# Patient Record
Sex: Male | Born: 1950 | Race: White | Hispanic: No | Smoking: Former smoker
Health system: Southern US, Community
[De-identification: ages and names within clinical notes are randomized; demographics above are authoritative.]

## PROBLEM LIST (undated history)

## (undated) DIAGNOSIS — J449 Chronic obstructive pulmonary disease, unspecified: Secondary | ICD-10-CM

## (undated) DIAGNOSIS — I219 Acute myocardial infarction, unspecified: Secondary | ICD-10-CM

## (undated) DIAGNOSIS — I5022 Chronic systolic (congestive) heart failure: Secondary | ICD-10-CM

## (undated) DIAGNOSIS — I10 Essential (primary) hypertension: Secondary | ICD-10-CM

## (undated) DIAGNOSIS — I2510 Atherosclerotic heart disease of native coronary artery without angina pectoris: Secondary | ICD-10-CM

## (undated) DIAGNOSIS — E119 Type 2 diabetes mellitus without complications: Secondary | ICD-10-CM

## (undated) DIAGNOSIS — E1129 Type 2 diabetes mellitus with other diabetic kidney complication: Secondary | ICD-10-CM

## (undated) HISTORY — PX: OTHER SURGICAL HISTORY: SHX169

## (undated) HISTORY — DX: Type 2 diabetes mellitus with other diabetic kidney complication: E11.29

---

## 1998-08-25 ENCOUNTER — Ambulatory Visit: Admission: RE | Admit: 1998-08-25 | Discharge: 1998-08-25 | Payer: Self-pay

## 2003-03-12 ENCOUNTER — Other Ambulatory Visit: Payer: Self-pay

## 2004-03-06 ENCOUNTER — Emergency Department: Payer: Self-pay | Admitting: Emergency Medicine

## 2004-03-28 ENCOUNTER — Other Ambulatory Visit: Payer: Self-pay

## 2004-03-28 ENCOUNTER — Inpatient Hospital Stay: Payer: Self-pay | Admitting: Internal Medicine

## 2004-04-06 ENCOUNTER — Ambulatory Visit: Payer: Self-pay

## 2004-04-10 ENCOUNTER — Encounter: Payer: Self-pay | Admitting: Cardiology

## 2004-04-18 ENCOUNTER — Encounter: Payer: Self-pay | Admitting: Cardiology

## 2004-05-19 ENCOUNTER — Encounter: Payer: Self-pay | Admitting: Cardiology

## 2004-06-18 ENCOUNTER — Encounter: Payer: Self-pay | Admitting: Cardiology

## 2007-01-09 ENCOUNTER — Other Ambulatory Visit: Payer: Self-pay

## 2007-01-09 ENCOUNTER — Emergency Department: Payer: Self-pay | Admitting: Unknown Physician Specialty

## 2007-01-10 ENCOUNTER — Emergency Department: Payer: Self-pay | Admitting: Emergency Medicine

## 2007-01-12 ENCOUNTER — Other Ambulatory Visit: Payer: Self-pay

## 2007-01-12 ENCOUNTER — Emergency Department: Payer: Self-pay | Admitting: Emergency Medicine

## 2007-07-10 ENCOUNTER — Emergency Department: Payer: Self-pay | Admitting: Emergency Medicine

## 2007-07-12 ENCOUNTER — Emergency Department: Payer: Self-pay | Admitting: Emergency Medicine

## 2007-12-18 ENCOUNTER — Inpatient Hospital Stay: Payer: Self-pay | Admitting: Internal Medicine

## 2008-08-02 ENCOUNTER — Inpatient Hospital Stay: Payer: Self-pay | Admitting: Internal Medicine

## 2008-10-19 ENCOUNTER — Ambulatory Visit: Payer: Self-pay | Admitting: Gastroenterology

## 2009-03-02 ENCOUNTER — Ambulatory Visit: Payer: Self-pay | Admitting: Cardiology

## 2009-03-03 ENCOUNTER — Ambulatory Visit: Payer: Self-pay | Admitting: Cardiology

## 2009-09-30 ENCOUNTER — Inpatient Hospital Stay: Payer: Self-pay | Admitting: Internal Medicine

## 2010-01-04 ENCOUNTER — Ambulatory Visit: Payer: Self-pay | Admitting: Family Medicine

## 2011-10-22 ENCOUNTER — Observation Stay: Payer: Self-pay | Admitting: Student

## 2011-10-22 LAB — TROPONIN I
Troponin-I: <0.02 ng/mL
Troponin-I: <0.02 ng/mL
Troponin-I: <0.02 ng/mL

## 2011-10-22 LAB — BASIC METABOLIC PANEL
Anion Gap: 3 — ABNORMAL LOW (ref 7–16)
Calcium, Total: 9.4 mg/dL (ref 8.5–10.1)
Co2: 34 mmol/L — ABNORMAL HIGH (ref 21–32)
Creatinine: 1.25 mg/dL (ref 0.60–1.30)
EGFR (African American): >60
EGFR (Non-African Amer.): >60
Glucose: 205 mg/dL — ABNORMAL HIGH (ref 65–99)

## 2011-10-22 LAB — CBC
HCT: 38.9 % — ABNORMAL LOW (ref 40.0–52.0)
HGB: 13.4 g/dL (ref 13.0–18.0)
MCHC: 34.4 g/dL (ref 32.0–36.0)
RBC: 4.25 10*6/uL — ABNORMAL LOW (ref 4.40–5.90)
RDW: 13.7 % (ref 11.5–14.5)
WBC: 6.1 10*3/uL (ref 3.8–10.6)

## 2011-10-22 LAB — CK-MB
CK-MB: 3.8 ng/mL — ABNORMAL HIGH (ref 0.5–3.6)
CK-MB: 3.5 ng/mL (ref 0.5–3.6)

## 2011-10-22 LAB — CK TOTAL AND CKMB (NOT AT ARMC)
CK, Total: 293 U/L — ABNORMAL HIGH (ref 35–232)
CK-MB: 3.8 ng/mL — ABNORMAL HIGH (ref 0.5–3.6)

## 2011-10-23 LAB — BASIC METABOLIC PANEL
Anion Gap: 7 (ref 7–16)
BUN: 20 mg/dL — ABNORMAL HIGH (ref 7–18)
Chloride: 101 mmol/L (ref 98–107)
Creatinine: 1.24 mg/dL (ref 0.60–1.30)
EGFR (African American): >60
EGFR (Non-African Amer.): >60
Glucose: 154 mg/dL — ABNORMAL HIGH (ref 65–99)
Osmolality: 283 (ref 275–301)
Potassium: 3.8 mmol/L (ref 3.5–5.1)
Sodium: 139 mmol/L (ref 136–145)

## 2011-10-23 LAB — CBC WITH DIFFERENTIAL/PLATELET
Basophil %: 0.5 %
Eosinophil #: 0.3 10*3/uL (ref 0.0–0.7)
HGB: 13.2 g/dL (ref 13.0–18.0)
Lymphocyte %: 26 %
MCH: 31 pg (ref 26.0–34.0)
MCHC: 34.6 g/dL (ref 32.0–36.0)
MCV: 90 fL (ref 80–100)
Monocyte #: 0.5 x10 3/mm (ref 0.2–1.0)
Monocyte %: 7.5 %
Neutrophil %: 61.7 %
RDW: 13.4 % (ref 11.5–14.5)

## 2011-10-23 LAB — LIPID PANEL: Cholesterol: 159 mg/dL (ref 0–200)

## 2011-10-23 LAB — HEMOGLOBIN A1C: Hemoglobin A1C: 9 % — ABNORMAL HIGH (ref 4.2–6.3)

## 2012-12-12 ENCOUNTER — Inpatient Hospital Stay: Payer: Self-pay | Admitting: Specialist

## 2012-12-12 LAB — COMPREHENSIVE METABOLIC PANEL
Albumin: 4.1 g/dL (ref 3.4–5.0)
Alkaline Phosphatase: 65 U/L (ref 50–136)
Anion Gap: 8 (ref 7–16)
BUN: 29 mg/dL — ABNORMAL HIGH (ref 7–18)
Bilirubin,Total: 0.2 mg/dL (ref 0.2–1.0)
Calcium, Total: 9.1 mg/dL (ref 8.5–10.1)
Chloride: 108 mmol/L — ABNORMAL HIGH (ref 98–107)
Co2: 24 mmol/L (ref 21–32)
Creatinine: 1.59 mg/dL — ABNORMAL HIGH (ref 0.60–1.30)
Glucose: 64 mg/dL — ABNORMAL LOW (ref 65–99)
Potassium: 3.8 mmol/L (ref 3.5–5.1)
Total Protein: 7.3 g/dL (ref 6.4–8.2)

## 2012-12-12 LAB — CBC
HCT: 39.5 % — ABNORMAL LOW (ref 40.0–52.0)
HGB: 13.6 g/dL (ref 13.0–18.0)
MCH: 31.3 pg (ref 26.0–34.0)
MCHC: 34.4 g/dL (ref 32.0–36.0)
MCV: 91 fL (ref 80–100)
Platelet: 211 10*3/uL (ref 150–440)
RBC: 4.35 10*6/uL — ABNORMAL LOW (ref 4.40–5.90)
RDW: 13.4 % (ref 11.5–14.5)

## 2012-12-12 LAB — CK TOTAL AND CKMB (NOT AT ARMC)
CK, Total: 607 U/L — ABNORMAL HIGH (ref 35–232)
CK-MB: 14.7 ng/mL — ABNORMAL HIGH (ref 0.5–3.6)

## 2012-12-12 LAB — TROPONIN I: Troponin-I: <0.02 ng/mL

## 2012-12-13 LAB — LIPID PANEL
Cholesterol: 160 mg/dL (ref 0–200)
HDL Cholesterol: 33 mg/dL — ABNORMAL LOW (ref 40–60)
Ldl Cholesterol, Calc: 61 mg/dL (ref 0–100)
Triglycerides: 328 mg/dL — ABNORMAL HIGH (ref 0–200)
VLDL Cholesterol, Calc: 66 mg/dL — ABNORMAL HIGH (ref 5–40)

## 2012-12-13 LAB — BASIC METABOLIC PANEL
Anion Gap: 3 — ABNORMAL LOW (ref 7–16)
BUN: 29 mg/dL — ABNORMAL HIGH (ref 7–18)
Calcium, Total: 8.4 mg/dL — ABNORMAL LOW (ref 8.5–10.1)
EGFR (African American): 53 — ABNORMAL LOW
Glucose: 93 mg/dL (ref 65–99)
Potassium: 3.7 mmol/L (ref 3.5–5.1)
Sodium: 141 mmol/L (ref 136–145)

## 2012-12-13 LAB — CK TOTAL AND CKMB (NOT AT ARMC)
CK, Total: 510 U/L — ABNORMAL HIGH (ref 35–232)
CK, Total: 486 U/L — ABNORMAL HIGH (ref 35–232)
CK-MB: 11.2 ng/mL — ABNORMAL HIGH (ref 0.5–3.6)
CK-MB: 9.9 ng/mL — ABNORMAL HIGH (ref 0.5–3.6)

## 2012-12-13 LAB — TROPONIN I
Troponin-I: <0.02 ng/mL
Troponin-I: <0.02 ng/mL

## 2012-12-13 LAB — APTT: Activated PTT: 51.7 secs — ABNORMAL HIGH (ref 23.6–35.9)

## 2012-12-23 ENCOUNTER — Ambulatory Visit: Payer: Self-pay | Admitting: Internal Medicine

## 2014-06-07 NOTE — Discharge Summary (Signed)
PATIENT NAME:  Lance Nunez, Lance Nunez MR#:  [id number] DATE OF BIRTH:  01/17/1951  DATE OF ADMISSION:  10/22/2011 DATE OF DISCHARGE:  10/23/2011  PRIMARY CARE PHYSICIAN: Dr. Iona Beard   CARDIOLOGIST: Dr. Nehemiah Massed    CONSULTANT: Dr. Nehemiah Massed   CHIEF COMPLAINT: Chest pain.   DISCHARGE DIAGNOSES:  1. Chest pain, possibly cardiac. The patient refused stress test.  2. Possible chronic systolic congestive heart failure with ejection fraction of 35 to 45% with regional wall motion abnormalities likely from coronary artery disease.  3. Uncontrolled diabetes.  4. Uncontrolled hyperlipidemia.  5. Gastroesophageal reflux disease.   6. History of V. tach, status post defibrillator placement.  7. History of previous myocardial infarction and coronary artery disease status post stenting and angioplasty. 8. CKD, stage III.  9. Chronic obstructive pulmonary disease.  10. Obstructive sleep apnea.   DISCHARGE MEDICATIONS:  1. Aspirin 81 mg 4 tabs once a day. 2. Glipizide 10 mg 1 tab 2 times a day. 3. Nitrostat 0.04% sublingual every five minutes x3 as needed for chest pain.  4. Gemfibrozil 600 mg once a day. 5. Coreg 3.125 mg 2 times a day.  6. Lovastatin 80 mg once a day.  7. Ranitidine 150 mg 2 tabs once a day. 8. Quinapril 10 mg once a day.  9. Omeprazole 20 mg once a day. 10. Gabapentin 600 mg 2 times a day.  11. Albuterol 90 mcg inhaled 2 puffs every 4 to 6 hours as needed for shortness of breath or wheezing.  12. Zetia 10 mg once a day. 13. Glucophage 500 mg once a day.  14. Advair 250/50 mcg inhaled 1 puff inhaled times a day.   DIET: Low sodium, consistent carb diabetic diet.   ACTIVITY: As tolerated.   FOLLOW-UP:  1. Please follow-up with Dr. Nehemiah Massed within 1 to 2 days for follow-up of chest pain.  2. Follow-up with your primary care physician for your blood sugar and cholesterol follow-up within 1 to 2 weeks.   DISPOSITION: Home.   CODE STATUS: FULL CODE.   HISTORY OF PRESENT  ILLNESS AND HOSPITAL COURSE: For full details of history and physical, please see the dictation on 10/22/2011 by Dr. Bridgette Habermann. Briefly, this is a 64 year old male with COPD, obstructive sleep apnea noncompliant with CPAP, and history of coronary artery disease status post MI and stenting in the past who presented with chest pain and was admitted to the hospitalist service for further evaluation and management. The patient had significant risk factors for acute coronary syndrome and, therefore, was admitted under observation to rule out acute coronary syndrome. The patient had a negative troponin on arrival. The patient was actually ruled out for acute coronary syndrome with cyclic cardiac markers and had no further chest pain. He was seen by Dr. Saralyn Pilar and Dr. Nehemiah Massed. Dr. Nehemiah Massed is his outpatient cardiologist. He underwent an echocardiogram showing EF of 35 to 40% with multiple wall motion abnormalities suggestive of ongoing coronary artery disease. Stress test was ordered for today, however, the patient refused secondary to severe claustrophobia. The patient was seen by Dr. Nehemiah Massed this morning and as he has no further chest pains and has been ruled out for acute coronary syndrome the recommendation was to go home with outpatient follow-up within the next couple of days. The patient is okay with this and will be discharged. However, the patient has significant risk factors including hemoglobin A1c of 9 and triglycerides in excess of 500. He needs further risk modifications and it was all explained to  him and the patient understands that he has got to lose weight, control his blood sugars better, and control his triglycerides better. At this point we would start him on Zetia as he is on gemfibrozil and statin already. We would also start him on metformin. Although he has CKD, his creatinine is stable at 1.24 with GFR of greater than 60 at this point. He will be discharged with those new medications and is to  follow-up with Dr. Nehemiah Massed. An appointment will be made for him within the next week or so. He is already on a beta-blocker and an ACE inhibitor.   TOTAL TIME SPENT: 40 minutes.   CODE STATUS: The patient is FULL CODE.    ____________________________ Vivien Presto, MD sa:drc D: 10/23/2011 11:37:50 ET T: 10/25/2011 10:14:43 ET JOB#: [id number]  cc: Vivien Presto, MD, <Dictator> Salome Holmes, MD Corey Skains, MD Vivien Presto MD ELECTRONICALLY SIGNED 10/30/2011 0:23

## 2014-06-07 NOTE — Consult Note (Signed)
PATIENT NAME:  Lance Nunez, Lance Nunez MR#:  [id number] DATE OF BIRTH:  1950-02-21  DATE OF CONSULTATION:  10/22/2011  REFERRING PHYSICIAN:   CONSULTING PHYSICIAN:  Isaias Cowman, MD  PRIMARY CARE PHYSICIAN: Dr. Iona Beard of [facility]  PRIMARY CARDIOLOGIST: Dr. Nehemiah Massed  CHIEF COMPLAINT: Chest pain.   REASON FOR CONSULTATION: Consultation requested for evaluation of chest pain.   HISTORY OF PRESENT ILLNESS: Patient is a 64 year old gentleman with known history of coronary artery disease and prior myocardial infarction. He reports that he was in his usual state of health until early this morning when he experienced substernal chest discomfort which was nonradiating. He presented to [facility] Emergency Room where EKG was nondiagnostic. Patient was treated with sublingual nitro spray which helped the chest pain. Patient was admitted to telemetry. Initial troponin is negative. Patient currently is chest pain free.   PAST MEDICAL HISTORY:  1. Status post myocardial infarction in 1995 with percutaneous transluminal coronary angioplasty and stent in mid LAD.  2. Status post myocardial infarction 11/1995 status post stent PDA.  3. Status post stent distal RCA 04/1997.   4. Status post percutaneous transluminal coronary angioplasty of in-stent restenosis of LAD 06/2003.   5. Status post Cypher stent of in-stent restenosis 09/2002.  6. Hypertension.  7. Hyperlipidemia. 8. Ventricular tachycardia status post implantable cardiac defibrillator 02/2004.   MEDICATIONS ON ADMISSION:  1. Aspirin 325 mg daily.  2. Accupril 10 mg daily.  3. Carvedilol 6.25 mg b.i.d.  4. Gemfibrozil 600 mg b.i.d.  5. Hydrochlorothiazide 25 mg daily.  6. Simvastatin 40 mg daily.  7. Gabapentin 600 mg daily.  8. Glucotrol 10 mg t.i.d.   SOCIAL HISTORY: The patient has a 21 pack-year tobacco abuse history.   FAMILY HISTORY: No immediate family history of coronary disease or myocardial infarction.   REVIEW OF  SYSTEMS: CONSTITUTIONAL: No fever or chills. EYES: No blurry vision. EARS: No hearing loss. RESPIRATORY: Shortness of breath. CARDIOVASCULAR: Chest pain as described above. GASTROINTESTINAL: No nausea, vomiting, diarrhea, or constipation. GENITOURINARY: No dysuria, hematuria. INTEGUMENTARY: No rash. HEMATOLOGICAL: No easy bruising or bleeding. MUSCULOSKELETAL: No arthralgias or myalgias. NEUROLOGICAL: No focal muscle weakness or numbness. PSYCHOLOGICAL: No depression or anxiety.   PHYSICAL EXAMINATION:  VITAL SIGNS: Blood pressure 151/91, pulse 69, respirations 19, temperature 38.2, pulse oximetry 93%.   HEENT: Pupils equal, reactive to light and accommodation.   NECK: Supple without thyromegaly.   LUNGS: Decreased breath sounds.   CARDIOVASCULAR: Normal jugular venous pressure. Normal point of maximal impulse. Regular rate and rhythm. Normal S1, S2. No appreciable gallop, murmur, rub.   ABDOMEN: Soft and nontender. Pulses were intact bilaterally.   MUSCULOSKELETAL: Normal muscle tone.   NEUROLOGIC: Patient is alert and oriented x3. Motor and sensory both grossly intact.   IMPRESSION: 64 year old gentleman with known coronary artery disease as described above who presents with chest pain with nondiagnostic EKG and negative troponin.    RECOMMENDATIONS:  1. Agree with current therapy.  2. Would defer full dose anticoagulation.  3. ETT sestamibi study in a.m.  4. Review 2-D echocardiogram.  ____________________________ Isaias Cowman, MD ap:cms D: 10/22/2011 13:49:34 ET T: 10/22/2011 14:17:11 ET JOB#: [id number]  cc: Isaias Cowman, MD, <Dictator> Isaias Cowman MD ELECTRONICALLY SIGNED 10/23/2011 9:00

## 2014-06-07 NOTE — H&P (Signed)
PATIENT NAME:  Lance Nunez, Lance Nunez MR#:  [id number] DATE OF BIRTH:  09-26-1950  DATE OF ADMISSION:  10/22/2011  REFERRING PHYSICIAN: Dr. Corky Downs PRIMARY CARE PHYSICIAN: Dr. Burn[facility]  PRIMARY CARDIOLOGIST: Dr. Nehemiah Massed   CHIEF COMPLAINT: Chest pain.   HISTORY OF PRESENT ILLNESS: Patient is a pleasant 64 year old Caucasian male with chronic obstructive pulmonary disease, obstructive sleep apnea, noncompliant with CPAP, history of coronary artery disease status post four stents and myocardial infarction in the past, hyperlipidemia who presents with chest pain starting about 4:30 in the morning who woke up from sleep with chest pain which is nonradiating in the center of his chest. Patient took two nitroglycerin and an aspirin and as pain persisted although a little better called EMS where he got nitroglycerin spray again x2 and another aspirin. Currently patient's pain is 2/10. Pain does not radiate. There is no diaphoresis. There is no shortness of breath or dizziness. Hospitalist service was contacted for further evaluation and management.   PAST MEDICAL HISTORY:  1. Hypertension.  2. Hyperlipidemia.  3. Gastroesophageal reflux disease.  4. History of V. tach status post defibrillator placement.  5. History of previous myocardial infarction in 1995, 1997, 1998.  6. History of coronary artery disease status post percutaneous transluminal coronary angioplasty and stent placement x4. 7. Chronic kidney disease stage III.  8. Chronic obstructive pulmonary disease. 9. Obstructive sleep apnea.   ALLERGIES: No known drug allergies.   FAMILY HISTORY: Mom with V. tach and history of coronary artery disease and diabetes in the family.   SOCIAL HISTORY: Used to be a smoker, quit about seven years ago. No alcohol or drug use. Lives at home.   MEDICATIONS:  1. Albuterol p.r.n.  2. Advair 250/50 inhaled 1 puff 2 times a day. 3. Aspirin 81 mg daily.  4. Coreg 3.125 mg daily.   5. Gabapentin 600 mg 2 times a day.  6. Glipizide 10 mg in the morning and 1 in the evening.  7. Lovastatin 80 mg daily.  8. Nitrostat sublingual 0.04 q.5 minutes x3 for chest pain as needed. 9. Omeprazole 20 mg daily.  10. Quinapril 10 mg daily.  11. Ranitidine 300 mg at bedtime.    REVIEW OF SYSTEMS: CONSTITUTIONAL: No fever or weight changes. No fatigue or weakness. EYES: No blurry vision, double vision. ENT: No tinnitus or hearing loss. No snoring. History of obstructive sleep apnea. RESPIRATORY: No cough or wheezing. Positive for chronic obstructive pulmonary disease. No painful respiration. CARDIOVASCULAR: Chest pain as above. No orthopnea or edema. History of V. tach in the past. No palpitations or syncope. GASTROINTESTINAL: No nausea, vomiting, diarrhea, abdominal pain, rectal bleeding or melena. GENITOURINARY: Denies dysuria, hematuria, incontinence. ENDOCRINE: Denies polyuria or nocturia. No thyroid problems. HEME/LYMPH: No anemia or easy bruising. SKIN: No new rashes. NEUROLOGICAL: No numbness, weakness. PSYCH: No anxiety or insomnia.   PHYSICAL EXAMINATION:  VITAL SIGNS: Temperature on arrival 98.3, pulse 70, respiratory rate 18, blood pressure 129/84, oxygen saturation 80% on room air which went to 96% on room air.   GENERAL: Patient is an obese Caucasian male lying in bed in no obvious distress, talking in full sentences.   HEENT: Normocephalic, atraumatic. Pupils equal and reactive. Anicteric sclerae. Moist mucous membranes. Extraocular muscles intact.   NECK: Supple. No thyroid tenderness.   RESPIRATORY: Good effort. No wheezing or rhonchi. Good air entry.   CARDIOVASCULAR: S1, S2 regular rate and rhythm. No murmurs, rubs, or gallops. No lower extremity edema.   GASTROINTESTINAL: No tenderness. Positive  bowel sounds all quadrants. Soft. No organomegaly noted.   SKIN: No obvious rashes.   PSYCH: Awake, alert, oriented x3. Pleasant, cooperative.   NEUROLOGICAL: Cranial  nerves II through XII grossly intact. Strength is 5/5 in all extremities. Sensation intact to light touch.   LABORATORY, DIAGNOSTIC AND RADIOLOGICAL DATA: Glucose 205, BUN 24, creatinine 1.25, potassium 4.1, CK-MB 3.8, CK total 293, troponin negative x1, WBC 6.1, hemoglobin 13.4, hematocrit 38.9, platelets 218. EKG: Normal sinus rhythm with some PACs, nonspecific intraventricular conduction delay and some nonspecific T wave changes. No acute ST elevations.   ASSESSMENT AND PLAN: We have a pleasant 64 year old Caucasian male with coronary artery disease status post stenting, V. tach status post defibrillator placement, history of myocardial infarction, hypertension, hyperlipidemia, diabetes who presents with chest pain. Patient with multiple risk factors for coronary artery disease and at this point we would admit the patient for observation and rule out myocardial infarction. Patient had significant improvement in his chest pain. There is no elevation of troponin but CK-MB portion is slightly elevated. We would obtain a cardiology consult, order an echo, cycle the troponins. Start the patient on aspirin, beta blocker and his statin and admit the patient for observation and see how he does. If patient does rule out for acute coronary syndrome we would do the stress test tomorrow. We would continue his gemfibrozil for the hyperlipidemia, check his lipid profile. Also start him on a nitro patch and sublingual nitroglycerin as needed for chest pain. I would continue his beta blocker as well as ACE inhibitor for the hypertension. His blood pressure remains stable. Would check a hemoglobin A1c and start the patient on sliding scale insulin for now and hold his glipizide. We would also continue the inhalers for chronic obstructive pulmonary disease. He does not appear to be on chronic obstructive pulmonary disease exacerbation and that is stable. Would continue his Neurontin for his diabetic neuropathy as well and  start him on PPI as well as deep vein thrombosis prophylaxis.   TOTAL TIME SPENT: 50 minutes.   CODE STATUS: Patient is FULL CODE.    ____________________________ Vivien Presto, MD sa:cms D: 10/22/2011 10:03:31 ET T: 10/22/2011 10:22:36 ET JOB#: [id number]  cc: Vivien Presto, MD, <Dictator> Salome Holmes, MD Corey Skains, MD Vivien Presto MD ELECTRONICALLY SIGNED 10/22/2011 17:41

## 2014-06-10 NOTE — Consult Note (Signed)
PATIENT NAME:  Lance Nunez, Lance Nunez MR#:  [id number] DATE OF BIRTH:  August 23, 1950  DATE OF CONSULTATION:  12/13/2012  REFERRING PHYSICIAN:  Dr. Posey Pronto. CONSULTING PHYSICIAN:  Corey Skains, MD  REASON FOR CONSULTATION: Unstable angina with known coronary artery disease, diabetes, hypertension and hyperlipidemia.   CHIEF COMPLAINT: "I had chest pain."   HISTORY OF PRESENT ILLNESS: This is a 64 year old male with known coronary disease status post previous PCI and stent placements, myocardial infarction who has had acute substernal chest discomfort radiating into his back with diaphoresis, weakness and fatigue and shortness of breath after carrying in groceries that lasted for two hours and relieved not by the nitroglycerin and/or other medical regimen, but by time. The patient has had an abnormal EKG in the past showing normal sinus rhythm with right bundle branch block with no evidence of acute myocardial infarction. Troponin and CK-MB within normal limits. The patient has had no current evidence of worsening chest pain at this time. He placed on appropriate medications and his diabetes, hypertension and hyperlipidemia have been very well controlled. The remainder review of systems negative for vision change, ringing in the ears, hearing loss, cough, congestion, heartburn, nausea, vomiting, diarrhea, bloody stools, stomach pain, extremity pain, leg weakness, cramping of the buttocks, known blood clots, headaches, blackouts, dizzy spells, nosebleeds, congestion, trouble swallowing, frequent urination, urination at night, muscle weakness, numbness, anxiety, depression, skin lesions, or skin rashes.   PAST MEDICAL HISTORY: 1. Coronary artery disease, status post PCI and stent placements.  2. Hypertension.  3. Hyperlipidemia.  4. Diabetes.   FAMILY HISTORY: Father had early onset of cardiovascular disease.   SOCIAL HISTORY: Currently denies alcohol or tobacco use.   ALLERGIES: As listed.    MEDICATIONS: As listed.   PHYSICAL EXAMINATION: VITAL SIGNS: Blood pressure 136/68 bilaterally, heart rate 72 upright, reclining, and regular.  GENERAL: He is a well appearing male in no acute distress.  HEENT: No icterus, thyromegaly, ulcers, hemorrhage, or xanthelasma.  CARDIOVASCULAR: Regular rate and rhythm. Normal S1 and S2. Diffuse PMI. No apparent significant heart murmurs. Carotid upstroke normal without bruit. Jugular venous pressure is normal.  LUNGS: Lungs have few basilar crackles with normal respirations.  ABDOMEN: Soft, nontender without hepatosplenomegaly or masses. Abdominal aorta is normal size without bruit.  EXTREMITIES: 2+ radial, femoral, dorsal pedal pulses, with trace lower extremity edema. No cyanosis, clubbing or ulcers.  NEUROLOGIC: He is oriented to time, place, and person, with normal mood and affect.   ASSESSMENT: A 64 year old male with a substernal chest pain consistent with possible unstable angina and symptoms consistent with his previous myocardial infarction and angina with an abnormal EKG, diabetes, hypertension and hyperlipidemia.   RECOMMENDATIONS: 1. Continue serial ECG and enzymes to assess for possible myocardial infarction.  2. Heparin for further risk reduction as well as aspirin.  3. Continue medication management including beta blocker, ACE inhibitor and nitrates for further risk reduction in symptoms.  4. Further consideration of cardiac catheterization versus stress test depending on ambulation as the patient is getting up and moving around this morning. We will follow very closely for the possibility. The patient understands the risks and benefits of cardiac catheterization. This includes the possibility of death, stroke, heart attack, infection, bleeding or blood clot. He is at low risk for conscious sedation.  5. The patient has chronic kidney disease and  renal protection if necessary, and also use of ACE inhibitor for hypertension control.   6. Echocardiogram for LV systolic dysfunction, valvular heart disease for further  evaluation of extent of previous LV systolic dysfunction from previous myocardial infarction and further treatment options as necessary.  ____________________________ Corey Skains, MD bjk:sg D: 12/13/2012 08:01:43 ET T: 12/13/2012 12:18:24 ET JOB#: [id number]  cc: Corey Skains, MD, <Dictator> Corey Skains MD ELECTRONICALLY SIGNED 12/14/2012 8:32

## 2014-06-10 NOTE — H&P (Signed)
PATIENT NAME:  Lance Nunez, WORD MR#:  [id number] DATE OF BIRTH:  Jan 01, 1951  DATE OF ADMISSION:  12/12/2012  PRIMARY CARE PHYSICIAN:  Dr. Iona Beard  PRIMARY PHYSICIAN:  Dr. Jimmye Norman   CHIEF COMPLAINT:  Chest pain today.   HISTORY OF PRESENT ILLNESS: A 64 year old Caucasian male with a history of hypertension, hyperlipidemia, CAD, who presented to the ED with chest pain today. The patient started to have chest pain about 6:00 p.m. today, which has been substernal, sharp, 6 out of 10, no radiation. In addition, the patient has diaphoresis and dizziness. He took nitroglycerin twice without relief. EMS gave aspirin. And sent patient to ED. The patient was noted to have elevated CK, CK-MB and started on heparin drip. The patient's chest pain has improved. The patient denies any other symptoms.   PAST MEDICAL HISTORY: Hypertension, hyperlipidemia, GERD, history of V-tach, status post defibrillator placement, previous MI 3 times, with multiple stents, CKD stage III, COPD,   obstructive sleep apnea, diabetes.   SOCIAL HISTORY: No smoking or drinking or illicit drugs.   FAMILY HISTORY: Mother had V-tach, history of CAD and diabetes.   SURGICAL HISTORY: Right lower extremity surgery.   ALLERGIES:  AXID and CODEINE.  HOME MEDICATIONS:  1.  Aspirin 81 mg p.o. daily.  2.  Fish oil 1000 mg p.o. daily. 3.  Gabapentin 600 mg t.i.d.  4.  Gemfibrozil 600 mg p.o. t.i.d.  5.  Glipizide XL 10 mg p.o. b.i.d.  6.  Lovastatin 40 mg p.o. 2 tablets at bedtime. 7.  Metformin 500 mg p.o. once a day before breakfast. 8.  Quinapril 10 mg p.o. daily.  9.  Ranitidine 300 mg p.o. at bedtime. 10.  Spironolactone 25 mg p.o. 0.5 tablets once a day.  11.  Sucralfate 1 gram p.o. t.i.d.  12.  Topiramate 100 mg p.o. daily.  13. Verapamil 180 mg p.o. daily.   REVIEW OF SYSTEMS:    CONSTITUTIONAL: The patient denies any fever or chills. No headache, but has dizziness and weakness.  EYES: No double vision or blurred  vision.  EARS, NOSE, THROAT: No postnasal drip, slurred speech or dysphagia.  CARDIOVASCULAR: Positive for chest pain and diaphoresis, but no palpitations, orthopnea, or nocturnal dyspnea. No leg edema.  PULMONARY: No cough, sputum, shortness of breath or hemoptysis.  GASTROINTESTINAL: No abdominal pain, nausea, vomiting or diarrhea.  GENITOURINARY: No dysuria, hematuria or incontinence.  SKIN: No rash or jaundice.  NEUROLOGY: No syncope, loss of consciousness or seizure.  HEMATOLOGY: No easy bruising or bleeding.  ENDOCRINOLOGY: No polyuria, polydipsia, heat or cold intolerance.   VITAL SIGNS: Temperature 98, blood pressure 108/64, pulse 61, O2 saturation 98% on oxygen by nasal cannula.   PHYSICAL EXAMINATION: GENERAL: The patient is alert, awake, oriented, in no acute distress.  HEENT: Pupils round, equal, react to light and accommodation.  NECK: Supple. No JVD or carotid bruits. No lymphadenopathy. No thyromegaly.  CARDIOVASCULAR: S1, S2. Regular rate and rhythm. No murmurs or gallops.  PULMONARY: Bilateral air entry. No wheezing or rales. No use of accessory muscle to breathe.  ABDOMEN: Soft, obese. Bowel sounds present. No organomegaly. No distention or tenderness.  EXTREMITIES: No edema, clubbing or cyanosis. No calf tenderness. Strong bilateral pedal pulses.  SKIN: No rash or jaundice.  NEUROLOGY: Alert and oriented x 3. No focal deficit. Power 5/5. Sensation intact.   LABORATORY DATA:  Troponin less than 0.02. CK 607, CK-MB 14.7. INR 1.0. WBC 7.2, hemoglobin 13.6, platelets 211. Glucose 64, BUN 29, creatinine 1.59. Electrolytes normal.  EKG showed normal sinus rhythm at 81 BPM.   IMPRESSIONS: 1.  Chest pain, possible non-ST segment elevation myocardial infarction.  2.  Coronary artery disease.  3.  Acute renal failure, on chronic kidney disease.  4.  Hypertension.  5.  Chronic obstructive pulmonary disease.  6.  Obstructive sleep apnea.  7.  Hyperlipidemia.  8.  Diabetes.    PLAN OF TREATMENT: 1.  The patient will be admitted to telemetry floor. We will continue O2 by nasal cannula. 2.  Continue heparin drip, and will continue aspirin, statin, and get a Cardiology consult from Dr. Nehemiah Massed. 3.  We will continue Coreg, but hold ACE inhibitor due to acute renal failure.  4.  We will start on normal saline and follow up BMP.  5.  Nebulizer p.r.n.   I discussed the patient's condition and plan of treatment with the patient and the patient's daughter.   THE PATIENT IS FULL CODE.   TIME SPENT: About 56 minutes.     ____________________________ Demetrios Loll, MD qc:mr D: 12/12/2012 21:12:00 ET T: 12/12/2012 22:13:42 ET JOB#: [id number]  cc: Demetrios Loll, MD, <Dictator> Demetrios Loll MD ELECTRONICALLY SIGNED 12/16/2012 17:57

## 2014-06-10 NOTE — Discharge Summary (Signed)
PATIENT NAME:  Lance Nunez, Lance Nunez MR#:  [id number] DATE OF BIRTH:  [birth year]  DATE OF ADMISSION:  12/12/2012 DATE OF DISCHARGE:  12/13/2012  For details of note, please take a look at the history and physical done on admission by Dr. Bridgett Larsson.   DIAGNOSES AT DISCHARGE: As follows: 1.  Chest pain, likely musculoskeletal in nature.  2.  Hypertension.  3.  Diabetes.  4.  Hyperlipidemia.  5.  History of coronary artery disease, status post stent placement.   DIET: The patient was discharged on a low-sodium, low-fat, American Diabetic Association diet.   ACTIVITY: As tolerated.   FOLLOWUP: With Dr. Serafina Royals in the next 1 to 2 days.   DISCHARGE MEDICATIONS: Lovastatin 80 mg at bedtime, quinapril 10 mg daily, gabapentin 600 mg t.i.d., gemfibrozil 600 mg t.i.d., sucralfate 1 gram t.i.d., aspirin 81 mg daily, Topamax 100 mg daily, metformin 5 mg daily, glipizide XL 10 mg b.i.d., Aldactone 12.5 mg daily, verapamil 180 mg daily, ranitidine 300 mg at bedtime, fish oil 1000 mg daily.   [place] COURSE: Dr. Serafina Royals from cardiology.   PERTINENT STUDIES DONE DURING THE HOSPITAL COURSE: A chest x-ray done on admission showing no acute cardiopulmonary disease.   HOSPITAL COURSE: This is a 64 year old male with medical problems as mentioned above, presented to the hospital with chest pain.     1.  Chest pain. The patient's symptoms of chest pain had some typical features for angina; therefore, he was observed overnight on telemetry. He did have significant risk factors given his previous history of coronary artery disease and diabetes. The patient had 3 sets of cardiac markers done, which were negative. The patient's chest pain had resolved and he was clinically asymptomatic. A cardiology consult was obtained. The patient was seen by Dr. Nehemiah Massed. The patient will have close followup with cardiology as an outpatient and be considered for an outpatient stress test if his  symptoms persist. At this point, he will continue medical management with his aspirin, beta blocker and statin as mentioned.  2.  Diabetes. The patient's blood sugars remain stable while in the hospital. His metformin was held due to some mild acute renal failure, although he will resume that upon discharge along with his glipizide.  3.  Hypertension. The patient remained hemodynamically stable on his verapamil and his quinapril, which he will resume that upon discharge.  4.  Hyperlipidemia. The patient was maintained on his lovastatin. He will resume that.  5.  GERD. The patient was maintained on his ranitidine, and he will also resume that upon discharge.  6.  History of seizures. The patient is on Topamax and he would also resume that upon discharge. He had no acute seizure-type activity while in the hospital.   CODE STATUS: The patient is a full code.   TIME SPENT ON DISCHARGE: 35 minutes.  ____________________________ Belia Heman. Verdell Carmine, MD vjs:aw D: 12/14/2012 08:41:00 ET T: 12/14/2012 09:13:52 ET JOB#: [id number]  cc: Belia Heman. Verdell Carmine, MD, <Dictator> Corey Skains, MD Henreitta Leber MD ELECTRONICALLY SIGNED 12/16/2012 14:33

## 2014-07-20 ENCOUNTER — Emergency Department: Payer: Medicare Other

## 2014-07-20 ENCOUNTER — Emergency Department
Admission: EM | Admit: 2014-07-20 | Discharge: 2014-07-20 | Discharge status: Against Medical Advice | Payer: Medicare Other | Attending: Emergency Medicine | Admitting: Emergency Medicine

## 2014-07-20 DIAGNOSIS — R0600 Dyspnea, unspecified: Secondary | ICD-10-CM | POA: Diagnosis not present

## 2014-07-20 DIAGNOSIS — I10 Essential (primary) hypertension: Secondary | ICD-10-CM | POA: Insufficient documentation

## 2014-07-20 DIAGNOSIS — E119 Type 2 diabetes mellitus without complications: Secondary | ICD-10-CM | POA: Diagnosis not present

## 2014-07-20 HISTORY — DX: Essential (primary) hypertension: I10

## 2014-07-20 HISTORY — DX: Atherosclerotic heart disease of native coronary artery without angina pectoris: I25.10

## 2014-07-20 HISTORY — DX: Type 2 diabetes mellitus without complications: E11.9

## 2014-07-20 HISTORY — DX: Chronic obstructive pulmonary disease, unspecified: J44.9

## 2014-07-20 LAB — COMPREHENSIVE METABOLIC PANEL
ALK PHOS: 69 U/L (ref 38–126)
ALT: 12 U/L — AB (ref 17–63)
AST: 19 U/L (ref 15–41)
Albumin: 3.6 g/dL (ref 3.5–5.0)
Anion gap: 10 (ref 5–15)
BUN: 22 mg/dL — AB (ref 6–20)
CO2: 27 mmol/L (ref 22–32)
Calcium: 8.9 mg/dL (ref 8.9–10.3)
Chloride: 105 mmol/L (ref 101–111)
Creatinine, Ser: 1.37 mg/dL — ABNORMAL HIGH (ref 0.61–1.24)
GFR calc Af Amer: >60 mL/min (ref >60)
GFR calc non Af Amer: 53 mL/min — ABNORMAL LOW (ref >60)
Glucose, Bld: 111 mg/dL — ABNORMAL HIGH (ref 65–99)
Potassium: 3.5 mmol/L (ref 3.5–5.1)
Sodium: 142 mmol/L (ref 135–145)
Total Bilirubin: 0.3 mg/dL (ref 0.3–1.2)
Total Protein: 6.8 g/dL (ref 6.5–8.1)

## 2014-07-20 LAB — CBC
HEMATOCRIT: 40.5 % (ref 40.0–52.0)
Hemoglobin: 13.6 g/dL (ref 13.0–18.0)
MCH: 30.7 pg (ref 26.0–34.0)
MCHC: 33.7 g/dL (ref 32.0–36.0)
MCV: 91.1 fL (ref 80.0–100.0)
Platelets: 240 10*3/uL (ref 150–440)
RBC: 4.44 MIL/uL (ref 4.40–5.90)
RDW: 14.1 % (ref 11.5–14.5)
WBC: 8.9 10*3/uL (ref 3.8–10.6)

## 2014-07-20 LAB — TROPONIN I: Troponin I: <0.03 ng/mL (ref <0.031)

## 2014-07-20 NOTE — ED Notes (Signed)
Patient presents for difficulty breathing that woke him up about 30 mins PTA. Patient denies CP though states he is "suffocating." Patient able to answer questions in complete sentences and normal sitting posture. Denies N/V

## 2015-06-09 ENCOUNTER — Emergency Department
Admission: EM | Admit: 2015-06-09 | Discharge: 2015-06-09 | Disposition: A | Discharge status: Home / Self Care | Payer: Medicare Other | Attending: Emergency Medicine | Admitting: Emergency Medicine

## 2015-06-09 ENCOUNTER — Emergency Department: Payer: Medicare Other

## 2015-06-09 DIAGNOSIS — E119 Type 2 diabetes mellitus without complications: Secondary | ICD-10-CM | POA: Diagnosis not present

## 2015-06-09 DIAGNOSIS — G4489 Other headache syndrome: Secondary | ICD-10-CM | POA: Diagnosis not present

## 2015-06-09 DIAGNOSIS — I10 Essential (primary) hypertension: Secondary | ICD-10-CM | POA: Diagnosis not present

## 2015-06-09 DIAGNOSIS — Z87891 Personal history of nicotine dependence: Secondary | ICD-10-CM | POA: Insufficient documentation

## 2015-06-09 DIAGNOSIS — R51 Headache: Secondary | ICD-10-CM | POA: Diagnosis present

## 2015-06-09 DIAGNOSIS — J449 Chronic obstructive pulmonary disease, unspecified: Secondary | ICD-10-CM | POA: Insufficient documentation

## 2015-06-09 DIAGNOSIS — I2510 Atherosclerotic heart disease of native coronary artery without angina pectoris: Secondary | ICD-10-CM | POA: Diagnosis not present

## 2015-06-09 NOTE — Discharge Instructions (Signed)

## 2015-06-09 NOTE — ED Notes (Signed)
Pt presents to ED with complaints of sharp pain left side of his head; pt states "it felt like an electric shock."  Pt reports similar episode last week where he got an "achy" feeling on left side of head while he was driving.  Pt reports the pain was intermittent with both episodes.  Pt denies any dizziness or changes in vision.  No signs of immediate distress at this time.

## 2015-06-09 NOTE — ED Notes (Addendum)
Pt reports that after eating he stood and had left side head pain that felt like an "electrical shock"  Pain occurred and then immediately stopped. Has a similar episode last week.  Pt is alert / oriented.  No weakness or deficits noted.  Clear speech. No pain at this time.

## 2015-06-09 NOTE — ED Notes (Signed)
Reviewed d/c instructions, follow-up care, and reasons to return to further treatment with pt. Pt verbalized understanding.

## 2015-06-09 NOTE — ED Provider Notes (Signed)
[facility] Emergency Department Provider Note  ____________________________________________  Time seen: Approximately 7:40 PM  I have reviewed the triage vital signs and the nursing notes.   HISTORY  Chief Complaint Headache    HPI Lance Nunez is a 65 y.o. male who presents emergency department complaining of sharp left-sided head pain. Patient states that he has had 2 episodes of electric shocklike feeling to the left side of his head. Patient states that previous episode was one week prior. He was driving at the time and felt a sharp sensation followed by numbness that only lasted a few seconds. Patient denied any visual changes, neck pain, trauma, loss of consciousness at that time. Patient reports that he had no further symptoms until this evening. He states that he was getting up from his chair when he felt a "lightning bolt/electrical shock" sensation to the left side of the head. Patient denies losing consciousness. He denies any visual changes, neck pain, chest pain, shortness of breath, nausea or vomiting. Patient denies any history of previous CVA/TIA. Patient is symptom-free at this time. Patient has not taken any medications prior to arrival.   Past Medical History  Diagnosis Date  . COPD (chronic obstructive pulmonary disease)   . Diabetes mellitus without complication   . Coronary artery disease   . Hypertension     There are no active problems to display for this patient.   Past Surgical History  Procedure Laterality Date  . Fractured leg Right   . Stents N/A     4 cardiac stents    No current outpatient prescriptions on file.  Allergies Codeine  No family history on file.  Social History Social History  Substance Use Topics  . Smoking status: Former Smoker    Quit date: 07/19/2004  . Smokeless tobacco: Not on file  . Alcohol Use: No   Review of Systems  Constitutional: No fever/chills Eyes: No visual changes.   Cardiovascular: no chest pain. Respiratory: no cough. No SOB. Gastrointestinal: No nausea, no vomiting.   Musculoskeletal: Negative for neck pain. Skin: Negative for rash. Neurological: Positive for headaches but denies focal weakness or numbness. 10-point ROS otherwise negative.   ____________________________________________   PHYSICAL EXAM:  VITAL SIGNS: ED Triage Vitals  Enc Vitals Group     BP 06/09/15 1824 170/94 mmHg     Pulse Rate 06/09/15 1822 92     Resp 06/09/15 1822 18     Temp 06/09/15 1822 98 F (36.7 C)     Temp Source 06/09/15 1822 Oral     SpO2 06/09/15 1822 96 %     Weight 06/09/15 1822 210 lb (95.255 kg)     Height 06/09/15 1822 5\' 5"  (1.651 m)     Head Cir --      Peak Flow --      Pain Score --      Pain Loc --      Pain Edu? --      Excl. in [place]? --    Constitutional: Alert and oriented. Well appearing and in no acute distress. Eyes: Conjunctivae are normal. PERRL. EOMI. Head: Atraumatic. Neck: No stridor.  No cervical spine tenderness to palpation. Neck with full range of motion. Cardiovascular: Normal rate, regular rhythm. Normal S1 and S2.  Good peripheral circulation. Respiratory: Normal respiratory effort without tachypnea or retractions. Lungs CTAB. Musculoskeletal: Full range of motion all extremities Neurologic:  Normal speech and language. No gross focal neurologic deficits are appreciated. He understood to 14  are grossly intact Skin:  Skin is warm, dry and intact. No rash noted. Psychiatric: Mood and affect are normal. Speech and behavior are normal. Patient exhibits appropriate insight and judgement.     ____________________________________________   LABS (all labs ordered are listed, but only abnormal results are displayed)  Labs Reviewed - No data to display ____________________________________________  EKG   ____________________________________________  RADIOLOGY Diamantina [facility] Reyansh Kushnir, personally viewed and evaluated  these images (plain radiographs) as part of my medical decision making, as well as reviewing the written report by the radiologist.  No results found.  ____________________________________________    PROCEDURES  Procedure(s) performed:       Medications - No data to display   ____________________________________________   INITIAL IMPRESSION / ASSESSMENT AND PLAN / ED COURSE  Pertinent labs & imaging results that were available during my care of the patient were reviewed by me and considered in my medical decision making (see chart for details).  Patient's diagnosis is consistent with headache of unknown origin. Patient reports to sign onset headaches that lasted only a few seconds a piece. He describes them as electric shock sensation. Patient is currently symptom free. Exam is very reassuring. CT reveals no acute intracranial abnormality.. No medications were prescribed at this time. Patient is to follow-up with neurologist for further evaluation. Patient is given ED precautions to return to the ED for any worsening or new symptoms.     ____________________________________________  FINAL CLINICAL IMPRESSION(S) / ED DIAGNOSES  Final diagnoses:  Other headache syndrome      NEW MEDICATIONS STARTED DURING THIS VISIT:  There are no discharge medications for this patient.       This chart was dictated using voice recognition software/Dragon. Despite best efforts to proofread, errors can occur which can change the meaning. Any change was purely unintentional.    Darletta Moll, PA-C 06/14/15 1819  Daymon Larsen, MD 06/15/15 1455

## 2015-08-28 ENCOUNTER — Inpatient Hospital Stay: Admission: RE | Admit: 2015-08-28 | Payer: Medicare Other | Source: Ambulatory Visit

## 2015-08-29 ENCOUNTER — Ambulatory Visit
Admission: RE | Admit: 2015-08-29 | Discharge: 2015-08-29 | Disposition: A | Discharge status: Home / Self Care | Payer: Medicare HMO | Source: Ambulatory Visit | Attending: Cardiology | Admitting: Cardiology

## 2015-08-29 ENCOUNTER — Encounter
Admission: RE | Admit: 2015-08-29 | Discharge: 2015-08-29 | Disposition: A | Discharge status: Home / Self Care | Payer: Medicare HMO | Source: Ambulatory Visit | Attending: Cardiology | Admitting: Cardiology

## 2015-08-29 DIAGNOSIS — F17200 Nicotine dependence, unspecified, uncomplicated: Secondary | ICD-10-CM

## 2015-08-29 DIAGNOSIS — Z01810 Encounter for preprocedural cardiovascular examination: Secondary | ICD-10-CM | POA: Diagnosis not present

## 2015-08-29 DIAGNOSIS — I252 Old myocardial infarction: Secondary | ICD-10-CM | POA: Insufficient documentation

## 2015-08-29 DIAGNOSIS — I472 Ventricular tachycardia: Secondary | ICD-10-CM | POA: Diagnosis not present

## 2015-08-29 DIAGNOSIS — Z95810 Presence of automatic (implantable) cardiac defibrillator: Secondary | ICD-10-CM | POA: Insufficient documentation

## 2015-08-29 DIAGNOSIS — I10 Essential (primary) hypertension: Secondary | ICD-10-CM | POA: Insufficient documentation

## 2015-08-29 DIAGNOSIS — Z7982 Long term (current) use of aspirin: Secondary | ICD-10-CM | POA: Insufficient documentation

## 2015-08-29 DIAGNOSIS — I2510 Atherosclerotic heart disease of native coronary artery without angina pectoris: Secondary | ICD-10-CM | POA: Insufficient documentation

## 2015-08-29 DIAGNOSIS — I509 Heart failure, unspecified: Secondary | ICD-10-CM | POA: Diagnosis not present

## 2015-08-29 DIAGNOSIS — Z01812 Encounter for preprocedural laboratory examination: Secondary | ICD-10-CM | POA: Diagnosis present

## 2015-08-29 DIAGNOSIS — I5022 Chronic systolic (congestive) heart failure: Secondary | ICD-10-CM | POA: Insufficient documentation

## 2015-08-29 DIAGNOSIS — Z45010 Encounter for checking and testing of cardiac pacemaker pulse generator [battery]: Secondary | ICD-10-CM

## 2015-08-29 DIAGNOSIS — Z79899 Other long term (current) drug therapy: Secondary | ICD-10-CM | POA: Insufficient documentation

## 2015-08-29 DIAGNOSIS — E785 Hyperlipidemia, unspecified: Secondary | ICD-10-CM | POA: Insufficient documentation

## 2015-08-29 DIAGNOSIS — Z7984 Long term (current) use of oral hypoglycemic drugs: Secondary | ICD-10-CM | POA: Insufficient documentation

## 2015-08-29 HISTORY — DX: Acute myocardial infarction, unspecified: I21.9

## 2015-08-29 LAB — DIFFERENTIAL
Basophils Absolute: 0 10*3/uL (ref 0–0.1)
Basophils Relative: 1 %
Eosinophils Absolute: 0.2 10*3/uL (ref 0–0.7)
Eosinophils Relative: 2 %
LYMPHS PCT: 23 %
Lymphs Abs: 1.9 10*3/uL (ref 1.0–3.6)
MONO ABS: 0.5 10*3/uL (ref 0.2–1.0)
MONOS PCT: 7 %
Neutro Abs: 5.5 10*3/uL (ref 1.4–6.5)
Neutrophils Relative %: 67 %

## 2015-08-29 LAB — CBC
HCT: 42.5 % (ref 40.0–52.0)
Hemoglobin: 14.6 g/dL (ref 13.0–18.0)
MCH: 31.1 pg (ref 26.0–34.0)
MCHC: 34.3 g/dL (ref 32.0–36.0)
MCV: 90.7 fL (ref 80.0–100.0)
PLATELETS: 180 10*3/uL (ref 150–440)
RBC: 4.69 MIL/uL (ref 4.40–5.90)
RDW: 14.4 % (ref 11.5–14.5)
WBC: 8.1 10*3/uL (ref 3.8–10.6)

## 2015-08-29 LAB — BASIC METABOLIC PANEL
Anion gap: 7 (ref 5–15)
BUN: 21 mg/dL — AB (ref 6–20)
CHLORIDE: 101 mmol/L (ref 101–111)
CO2: 30 mmol/L (ref 22–32)
Calcium: 8.9 mg/dL (ref 8.9–10.3)
Creatinine, Ser: 1.39 mg/dL — ABNORMAL HIGH (ref 0.61–1.24)
GFR calc Af Amer: 60 mL/min — ABNORMAL LOW (ref >60)
GFR calc non Af Amer: 52 mL/min — ABNORMAL LOW (ref >60)
GLUCOSE: 145 mg/dL — AB (ref 65–99)
Potassium: 4.7 mmol/L (ref 3.5–5.1)
SODIUM: 138 mmol/L (ref 135–145)

## 2015-08-29 LAB — SURGICAL PCR SCREEN
MRSA, PCR: NEGATIVE
STAPHYLOCOCCUS AUREUS: NEGATIVE

## 2015-08-29 NOTE — Patient Instructions (Addendum)
Your procedure is scheduled on: Wednesday 09/06/15 Report to Day Surgery. 2ND FLOOR MEDICAL MALL ENTRANCE To find out your arrival time please call [phone] between 1PM - 3PM on Tuesday 09/05/15.  Remember: Instructions that are not followed completely may result in serious medical risk, up to and including death, or upon the discretion of your surgeon and anesthesiologist your surgery may need to be rescheduled.    __X__ 1. Do not eat food or drink liquids after midnight. No gum chewing or hard candies.     __X__ 2. No Alcohol for 24 hours before or after surgery.   ____ 3. Bring all medications with you on the day of surgery if instructed.    __X__ 4. Notify your doctor if there is any change in your medical condition     (cold, fever, infections).     Do not wear jewelry, make-up, hairpins, clips or nail polish.  Do not wear lotions, powders, or perfumes.   Do not shave 48 hours prior to surgery. Men may shave face and neck.  Do not bring valuables to the hospital.    [facility] is not responsible for any belongings or valuables.               Contacts, dentures or bridgework may not be worn into surgery.  Leave your suitcase in the car. After surgery it may be brought to your room.  For patients admitted to the hospital, discharge time is determined by your                treatment team.   Patients discharged the day of surgery will not be allowed to drive home.   Please read over the following fact sheets that you were given:   MRSA Information and Surgical Site Infection Prevention   __X__ Take these medicines the morning of surgery with A SIP OF WATER:    1. CARVEDILOL  2. GABAPENTIN  3. GEMFIBROZIL  4. QUINAPRIL  5. ranitadine  6.  ____ Fleet Enema (as directed)   __X__ Use CHG Soap as directed  ____ Use inhalers on the day of surgery  __X__ Stop metformin 2 days prior to surgery    ____ Take 1/2 of usual insulin dose the night before surgery and none on the  morning of surgery.   __X__ Stop Coumadin/Plavix/aspirin on CONTACT CARDIOLOGIST ABOUT STOPPING ASPIRIN  ____ Stop Anti-inflammatories on    ____ Stop supplements until after surgery.    __X__ Bring BIPAP/ C-Pap to the hospital.

## 2015-09-06 ENCOUNTER — Encounter
Admission: RE | Disposition: A | Discharge status: Home / Self Care | Payer: Self-pay | Source: Ambulatory Visit | Attending: Cardiology

## 2015-09-06 ENCOUNTER — Encounter: Payer: Self-pay | Admitting: *Deleted

## 2015-09-06 ENCOUNTER — Ambulatory Visit: Payer: Medicare HMO | Admitting: Certified Registered Nurse Anesthetist

## 2015-09-06 ENCOUNTER — Ambulatory Visit
Admission: RE | Admit: 2015-09-06 | Discharge: 2015-09-06 | Disposition: A | Discharge status: Home / Self Care | Payer: Medicare HMO | Source: Ambulatory Visit | Attending: Cardiology | Admitting: Cardiology

## 2015-09-06 DIAGNOSIS — Z4502 Encounter for adjustment and management of automatic implantable cardiac defibrillator: Secondary | ICD-10-CM | POA: Insufficient documentation

## 2015-09-06 DIAGNOSIS — Z6835 Body mass index (BMI) 35.0-35.9, adult: Secondary | ICD-10-CM | POA: Diagnosis not present

## 2015-09-06 DIAGNOSIS — E785 Hyperlipidemia, unspecified: Secondary | ICD-10-CM | POA: Insufficient documentation

## 2015-09-06 DIAGNOSIS — Z885 Allergy status to narcotic agent status: Secondary | ICD-10-CM | POA: Insufficient documentation

## 2015-09-06 DIAGNOSIS — I110 Hypertensive heart disease with heart failure: Secondary | ICD-10-CM | POA: Insufficient documentation

## 2015-09-06 DIAGNOSIS — Z888 Allergy status to other drugs, medicaments and biological substances status: Secondary | ICD-10-CM | POA: Diagnosis not present

## 2015-09-06 DIAGNOSIS — Z87891 Personal history of nicotine dependence: Secondary | ICD-10-CM | POA: Insufficient documentation

## 2015-09-06 DIAGNOSIS — E669 Obesity, unspecified: Secondary | ICD-10-CM | POA: Insufficient documentation

## 2015-09-06 DIAGNOSIS — Z79899 Other long term (current) drug therapy: Secondary | ICD-10-CM | POA: Diagnosis not present

## 2015-09-06 DIAGNOSIS — I2510 Atherosclerotic heart disease of native coronary artery without angina pectoris: Secondary | ICD-10-CM | POA: Insufficient documentation

## 2015-09-06 DIAGNOSIS — Z955 Presence of coronary angioplasty implant and graft: Secondary | ICD-10-CM | POA: Insufficient documentation

## 2015-09-06 DIAGNOSIS — I472 Ventricular tachycardia: Secondary | ICD-10-CM | POA: Diagnosis present

## 2015-09-06 DIAGNOSIS — Z7984 Long term (current) use of oral hypoglycemic drugs: Secondary | ICD-10-CM | POA: Insufficient documentation

## 2015-09-06 DIAGNOSIS — E119 Type 2 diabetes mellitus without complications: Secondary | ICD-10-CM | POA: Diagnosis not present

## 2015-09-06 DIAGNOSIS — Z7982 Long term (current) use of aspirin: Secondary | ICD-10-CM | POA: Insufficient documentation

## 2015-09-06 DIAGNOSIS — I252 Old myocardial infarction: Secondary | ICD-10-CM | POA: Insufficient documentation

## 2015-09-06 DIAGNOSIS — I5022 Chronic systolic (congestive) heart failure: Secondary | ICD-10-CM | POA: Insufficient documentation

## 2015-09-06 DIAGNOSIS — Z6836 Body mass index (BMI) 36.0-36.9, adult: Secondary | ICD-10-CM | POA: Insufficient documentation

## 2015-09-06 HISTORY — PX: IMPLANTABLE CARDIOVERTER DEFIBRILLATOR (ICD) GENERATOR CHANGE: SHX5469

## 2015-09-06 LAB — GLUCOSE, CAPILLARY
GLUCOSE-CAPILLARY: 144 mg/dL — AB (ref 65–99)
Glucose-Capillary: 130 mg/dL — ABNORMAL HIGH (ref 65–99)

## 2015-09-06 SURGERY — ICD GENERATOR CHANGE
Anesthesia: Monitor Anesthesia Care | Site: Chest | Wound class: Clean

## 2015-09-06 MED ORDER — PHENYLEPHRINE HCL 10 MG/ML IJ SOLN
INTRAMUSCULAR | Status: DC | PRN
  Administered 2015-09-06 (×2): 100 ug via INTRAVENOUS

## 2015-09-06 MED ORDER — FENTANYL CITRATE (PF) 100 MCG/2ML IJ SOLN: 25.0000 ug | INTRAMUSCULAR | Status: DC | PRN

## 2015-09-06 MED ORDER — MIDAZOLAM HCL 2 MG/2ML IJ SOLN
INTRAMUSCULAR | Status: DC | PRN
  Administered 2015-09-06: 1 mg via INTRAVENOUS

## 2015-09-06 MED ORDER — ONDANSETRON HCL 4 MG/2ML IJ SOLN: 4.0000 mg | Freq: Once | INTRAMUSCULAR | Status: DC | PRN

## 2015-09-06 MED ORDER — CEFAZOLIN IN D5W 1 GM/50ML IV SOLN: 1.0000 g | Freq: Once | INTRAVENOUS | Status: DC

## 2015-09-06 MED ORDER — LIDOCAINE 1 % OPTIME INJ - NO CHARGE
INTRAMUSCULAR | Status: DC | PRN
  Administered 2015-09-06: 19 mL

## 2015-09-06 MED ORDER — PROPOFOL 10 MG/ML IV BOLUS
INTRAVENOUS | Status: DC | PRN
  Administered 2015-09-06: 40 mg via INTRAVENOUS

## 2015-09-06 MED ORDER — CEFAZOLIN IN D5W 1 GM/50ML IV SOLN
INTRAVENOUS | Status: AC
  Filled 2015-09-06: qty 50

## 2015-09-06 MED ORDER — ACETAMINOPHEN 325 MG PO TABS: 325.0000 mg | ORAL_TABLET | ORAL | Status: DC | PRN

## 2015-09-06 MED ORDER — SODIUM CHLORIDE 0.9 % IV SOLN
INTRAVENOUS | Status: DC
  Administered 2015-09-06: 13:00:00 via INTRAVENOUS

## 2015-09-06 MED ORDER — CEPHALEXIN 250 MG PO CAPS: 250.0000 mg | ORAL_CAPSULE | Freq: Four times a day (QID) | ORAL | Status: DC

## 2015-09-06 MED ORDER — EPHEDRINE SULFATE 50 MG/ML IJ SOLN
INTRAMUSCULAR | Status: DC | PRN
  Administered 2015-09-06: 10 mg via INTRAVENOUS

## 2015-09-06 MED ORDER — ONDANSETRON HCL 4 MG/2ML IJ SOLN: 4.0000 mg | Freq: Four times a day (QID) | INTRAMUSCULAR | Status: DC | PRN

## 2015-09-06 MED ORDER — PROPOFOL 500 MG/50ML IV EMUL
INTRAVENOUS | Status: DC | PRN
  Administered 2015-09-06: 140 ug/kg/min via INTRAVENOUS
  Administered 2015-09-06: 150 ug/kg/min via INTRAVENOUS

## 2015-09-06 MED ORDER — SODIUM CHLORIDE 0.9 % IR SOLN
Freq: Once | Status: DC
  Filled 2015-09-06: qty 2

## 2015-09-06 MED ORDER — FENTANYL CITRATE (PF) 100 MCG/2ML IJ SOLN
INTRAMUSCULAR | Status: DC | PRN
  Administered 2015-09-06: 50 ug via INTRAVENOUS

## 2015-09-06 MED ORDER — MINERAL OIL LIGHT 100 % EX OIL
TOPICAL_OIL | CUTANEOUS | Status: AC
  Filled 2015-09-06: qty 25

## 2015-09-06 SURGICAL SUPPLY — 49 items
BAG DECANTER FOR FLEXI CONT (MISCELLANEOUS) ×3 IMPLANT
BLADE SURG SZ10 CARB STEEL (BLADE) ×3 IMPLANT
CANISTER SUCT 1200ML W/VALVE (MISCELLANEOUS) ×3 IMPLANT
CHLORAPREP W/TINT 26ML (MISCELLANEOUS) ×3 IMPLANT
CLOSURE WOUND 1/2 X4 (GAUZE/BANDAGES/DRESSINGS) ×1
COVER LIGHT HANDLE STERIS (MISCELLANEOUS) ×6 IMPLANT
DEVICE DISSECT PLASMABLAD 3.0S (MISCELLANEOUS) ×1 IMPLANT
DRAPE C-ARM XRAY 36X54 (DRAPES) ×3 IMPLANT
DRAPE INCISE IOBAN 66X45 STRL (DRAPES) ×3 IMPLANT
DRAPE LAPAROTOMY 77X122 PED (DRAPES) ×3 IMPLANT
DRESSING TELFA 4X3 1S ST N-ADH (GAUZE/BANDAGES/DRESSINGS) ×2 IMPLANT
DRSG TEGADERM 4X4.75 (GAUZE/BANDAGES/DRESSINGS) ×3 IMPLANT
DRSG TEGADERM 6X8 (GAUZE/BANDAGES/DRESSINGS) ×3 IMPLANT
ELECT REM PT RETURN 9FT ADLT (ELECTROSURGICAL) ×3
ELECTRODE REM PT RTRN 9FT ADLT (ELECTROSURGICAL) ×1 IMPLANT
GLOVE BIO SURGEON STRL SZ8 (GLOVE) ×3 IMPLANT
GOWN STRL REUS W/ TWL LRG LVL3 (GOWN DISPOSABLE) ×1 IMPLANT
GOWN STRL REUS W/ TWL XL LVL3 (GOWN DISPOSABLE) ×1 IMPLANT
GOWN STRL REUS W/TWL LRG LVL3 (GOWN DISPOSABLE) ×3
GOWN STRL REUS W/TWL XL LVL3 (GOWN DISPOSABLE) ×3
GRADUATE 1200CC STRL 31836 (MISCELLANEOUS) ×3 IMPLANT
ICD EVERA XT MRI DF1  DDMB1D1 (ICD Generator) ×2 IMPLANT
ICD EVERA XT MRI DF1 DDMB1D1 (ICD Generator) IMPLANT
IV NS 1000ML (IV SOLUTION) ×3
IV NS 1000ML BAXH (IV SOLUTION) ×1 IMPLANT
KIT RM TURNOVER STRD PROC AR (KITS) ×3 IMPLANT
NDL FILTER BLUNT 18X1 1/2 (NEEDLE) ×1 IMPLANT
NDL HYPO 25X1 1.5 SAFETY (NEEDLE) ×1 IMPLANT
NDL SPNL 22GX3.5 QUINCKE BK (NEEDLE) ×1 IMPLANT
NEEDLE FILTER BLUNT 18X 1/2SAF (NEEDLE) ×2
NEEDLE FILTER BLUNT 18X1 1/2 (NEEDLE) ×1 IMPLANT
NEEDLE HYPO 25X1 1.5 SAFETY (NEEDLE) ×3 IMPLANT
NEEDLE SPNL 22GX3.5 QUINCKE BK (NEEDLE) ×3 IMPLANT
NS IRRIG 500ML POUR BTL (IV SOLUTION) ×3 IMPLANT
PACK BASIN MINOR ARMC (MISCELLANEOUS) ×3 IMPLANT
PACK PACE INSERTION (MISCELLANEOUS) ×3 IMPLANT
PAD STATPAD (MISCELLANEOUS) ×3 IMPLANT
PLASMABLADE 3.0S (MISCELLANEOUS) ×3
STRAP SAFETY BODY (MISCELLANEOUS) ×3 IMPLANT
STRIP CLOSURE SKIN 1/2X4 (GAUZE/BANDAGES/DRESSINGS) ×2 IMPLANT
SUT SILK 2 0 SH (SUTURE) ×3 IMPLANT
SUT VIC AB 2-0 CT1 27 (SUTURE) ×3
SUT VIC AB 2-0 CT1 TAPERPNT 27 (SUTURE) ×1 IMPLANT
SUT VIC AB 2-0 CT2 27 (SUTURE) ×3 IMPLANT
SUT VIC AB 3-0 PS2 18 (SUTURE) ×3 IMPLANT
SUT VIC AB 4-0 PS2 18 (SUTURE) ×3 IMPLANT
SYR BULB IRRIG 60ML STRL (SYRINGE) ×3 IMPLANT
SYR CONTROL 10ML (SYRINGE) ×3 IMPLANT
SYRINGE 10CC LL (SYRINGE) ×3 IMPLANT

## 2015-09-06 NOTE — H&P (Signed)
Printout Information Document Contents Office Visit Document Received Date 09/06/2015 Document Source Organization [facility] Patient Demographics  Reason for Visit  Encounter Details  Social Hx  Progress Notes  Plan of Treatment  Visit Diagnoses  Document Information Encounter Summary - Lance Nunez [id number] y.o. Lance Nunez of Jul. [birth year] 2017 Patient Demographics Patient Address Communication Language Race / Ethnicity  Lance Nunez [place], [place] [address]  [phone] (Home) [email or web address]  English (Preferred) White / Not Hispanic or Latino  Reason for Visit Reason Comments  Follow-up ICD changeout approaching  Pacer-ICD   Encounter Details Date Type Department Care Team Description  08/17/2015 Office Visit [facility]  [place], [place] [address]  [id number]  Lance Nunez, [facility]  [facility]  [place], [place] [address]  [phone]  [phone] (Fax)  Ventricular tachycardia (CMS-HCC) (Primary Dx);Chronic systolic CHF (congestive heart failure) (CMS-HCC);Biventricular ICD (implantable cardioverter-defibrillator) in place  Social History - as of this encounter Tobacco Use Types Packs/Day Years Used Date  Former Smoker Cigarettes 1 39    Sex Assigned at [employer] Date Recorded  Not on file   Progress Notes - in this encounter Lance Dibble, MD - 08/17/2015 10:45 AM EDT Formatting of this note may be different from the original. Established Patient Visit   Chief Complaint: Chief Complaint  Patient presents with  . Follow-up  ICD changeout approaching  . Pacer-ICD  Date of Service: 08/17/2015 Date of Birth: May 01, 1950 PCP: Lance Nunez (Inactive)  History of Present Illness: Lance Nunez is a 65 y.o.male patient  Congestive Heart Failure Checklist for Best Practice The patient has a diagnosis of systolic dysfunction congestive  heart failure. Currently the more precise category of heart failure appears to be heart failure with reduced EF (HFrEF <40%). The patient has risk factors including hypertension, hyperlipidemia, obesity and diabetes for which there has been a concerted effort to treat to reduce future complication.  There is not current concerns for family history of cardiomyopathy and or congestive heart failure.  Echocardiogram recently shows an EF of 35%. The patient has been placed on medication management including ace inhibitor, beta blocker and aldactone and intolerant of none due to not intolerant. Defibrillator placement and or resynchronization has been discussed with criteria including New York Heart Association class II symptoms, EF of 35%, and defib is at [facility] Heart failure education materials have been given and discussions have occurred specifically addressing low sodium DASH diet and organized daily physical activity regimen.  Defibrillator Interrogation The patient has had the placement of a defibrillator for significant LV systolic dysfunction. Recent interrogation has revealed that the battery voltage is low indicating the need for change out for a new battery. The patient understands the risks and benefits of the defibrillator battery change.  Past Medical and Surgical History  Past Medical History Past Medical History:  Diagnosis Date  . Coronary artery disease  sp PTCA and stent placement of LAD and PDA.  Marland Kitchen Foot fracture, right  Total disability  . Hyperlipidemia, unspecified  . Hypertension  . Myocardial infarction (CMS-HCC)  1995, 10/97, 8/98   Past Surgical History He has no past surgical history on file.   Medications and Allergies  Current Medications  Current Outpatient Prescriptions  Medication Sig Dispense Refill  . aspirin 81 MG chewable tablet Take 81 mg by mouth once daily.  . carvedilol (COREG) 6.25 MG tablet TAKE ONE TABLET TWICE DAILY 180 tablet 0  .  gabapentin  (NEURONTIN) 600 MG tablet Take 600 mg by mouth 3 (three) times daily.  Marland Kitchen gemfibrozil (LOPID) 600 mg tablet Take 600 mg by mouth 2 (two) times daily before meals.  Marland Kitchen glipiZIDE (GLUCOTROL) 10 MG tablet Take 10 mg by mouth 2 (two) times daily before meals.  . lovastatin (MEVACOR) 40 MG tablet Take 40 mg by mouth 2 (two) times daily.  . metFORMIN (GLUCOPHAGE) 500 MG tablet Take 500 mg by mouth once daily.  . nitroGLYcerin (NITROSTAT) 0.4 MG SL tablet Place 0.4 mg under the tongue every 5 (five) minutes as needed for Chest pain. May take up to 3 doses.  Marland Kitchen quinapril (ACCUPRIL) 20 MG tablet  . ranitidine (ZANTAC) 150 MG tablet Take 150 mg by mouth 2 (two) times daily.  Marland Kitchen spironolactone (ALDACTONE) 25 MG tablet Take 0.5 tablets (12.5 mg total) by mouth once daily. Take 1/2 tablet every morning 15 tablet 0  . sucralfate (CARAFATE) 1 gram tablet Take 1 g by mouth 4 (four) times daily before meals and nightly.  . verapamil (CALAN-SR) 180 MG SR tablet   No current facility-administered medications for this visit.   Allergies: Axid [nizatidine] and Codeine  Social and Family History  Social History reports that he has quit smoking. His smoking use included Cigarettes. He has a 21.00 pack-year smoking history. He does not have any smokeless tobacco history on file.  Family History History reviewed. No pertinent family history.  Review of Systems   Review of Systems  Positive for sob Negative for weight gain weight loss, weakness, vision change, hearing loss, cough, congestion, PND, orthopnea, heartburn, nausea, diaphoresis, vomiting, diarrhea, bloody stool, melena, stomach pain, extremity pain, leg weakness, leg cramping, headache, blackouts, nosebleed, trouble swallowing, mouth pain, urinary frequency, urination at night, muscle weakness, skin lesions, skin rashes, tingling ,ulcers, numbness, anxiety, and/or depression Physical Examination   Vitals:BP (P) 120/70  Pulse (P) 72  Resp (P) 14  Ht (P)  165.1 cm (5\' 5" )  Wt (P) 98 kg (216 lb)  BMI (P) 35.94 kg/m2 Ht:(P) 165.1 cm (5\' 5" ) Wt:(P) 98 kg (216 lb) [id number] surface area is 2.12 meters squared (pended). Body mass index is 35.94 kg/(m^2) (pended). Appearance: well appearing in no acute distress HEENT: Pupils equally reactive to light and accomodation, no xanthalasma  Neck: Supple, no apparent thyromegaly, masses, or lymphadenopathy  Lungs: normal respiratory effort; no crackles, no rhonchi, no wheezes Heart: Regular rate and rhythm. Normal S1 S2 No gallops, murmur, no rub, PMI is normal size and placement. carotid upstroke normal without bruit. Jugular venous pressure is normal Abdomen: soft, nontender, not distended with normal bowel sounds. No apparent hepatosplenomegally. Abdominal aorta is normal size without bruit Extremities: no edema, no ulcers, no clubbing, no cyanosis Peripheral Pulses: 2+ in upper extremities, 2+ femoral pulses bilaterally, 2+lower extremity  Musculoskeletal; Normal muscle tone without kyphosis Neurological: Oriented and Alert, Cranial nerves intact  Assessment   65 y.o. male with  Encounter Diagnoses  Name Primary?  . Chronic systolic CHF (congestive heart failure) (CMS-HCC)  . Ventricular tachycardia (CMS-HCC) Yes  . Biventricular ICD (implantable cardioverter-defibrillator) in place   Plan  -The patient is to have consultation and subsequent change of battery of the defibrillator. The patient understands all the risks and benefits of this procedure. This includes the possibility of death, stroke, heart attack, hemopericardium, pneumothorax, infection, bleeding, blood clot, and or reaction to medications. The patient is at low risk for general anesthesia. -No further medical intervention of congestive heart failure which appears  to be clinically stable at this time. The patient has been on appropriate medication management as well as using diet and exercise to improve quality of life. We have discussed  that the patient would continue these measures for long term risk reduction. The patient understands to call if any new significant symptoms occurred to reduce hospitalization.  No orders of the defined types were placed in this encounter.  Return in about 5 weeks (around 09/21/2015).  Lance Dibble, MD    Plan of Treatment - as of this encounter Not on file  Visit Diagnoses - in this encounter Diagnosis  Ventricular tachycardia (CMS-HCC) - Primary  Paroxysmal ventricular tachycardia   Chronic systolic CHF (congestive heart failure) (CMS-HCC)  Biventricular ICD (implantable cardioverter-defibrillator) in place  Document Information Service Providers Document Coverage Dates Jun. 29, 2017 - Jun. 29, 2017 [place] [phone] (Work) [place], [place] [address] Encounter Providers Bruce Lenn Sink MD (Attending) tel:[phone] (Work) fax:[phone] [address] [facility] [place], [place] [address] Encounter Date Jun. 29, 2017 - Jun. 29, 2017

## 2015-09-06 NOTE — Anesthesia Postprocedure Evaluation (Signed)
Anesthesia Post Note  Patient: Lance Nunez  Procedure(s) Performed: Procedure(s) (LRB): ICD GENERATOR CHANGE (N/A)  Patient location during evaluation: PACU Anesthesia Type: General Level of consciousness: awake Pain management: satisfactory to patient Vital Signs Assessment: post-procedure vital signs reviewed and stable Respiratory status: spontaneous breathing and patient connected to nasal cannula oxygen Cardiovascular status: stable Anesthetic complications: no    Last Vitals:  Filed Vitals:   09/06/15 1429 09/06/15 1432  BP: 107/75 107/75  Pulse: 78 76  Temp: 36.2 C 36.2 C  Resp: 17 19    Last Pain: There were no vitals filed for this visit.               VAN STAVEREN,Mishayla Sliwinski

## 2015-09-06 NOTE — Transfer of Care (Signed)
Immediate Anesthesia Transfer of Care Note  Patient: Lance Nunez  Procedure(s) Performed: Procedure(s): ICD GENERATOR CHANGE (N/A)  Patient Location: PACU  Anesthesia Type:General  Level of Consciousness: awake, oriented and patient cooperative  Airway & Oxygen Therapy: Patient Spontanous Breathing and Patient connected to face mask oxygen  Post-op Assessment: Report given to RN and Post -op Vital signs reviewed and stable  Post vital signs: Reviewed and stable  Last Vitals:  Filed Vitals:   09/06/15 1248 09/06/15 1429  BP: 137/80 107/75  Pulse: 70 78  Temp: 36.5 C 36.2 C  Resp: 20 17    Last Pain: There were no vitals filed for this visit.       Complications: No apparent anesthesia complications

## 2015-09-06 NOTE — Discharge Instructions (Signed)

## 2015-09-06 NOTE — Anesthesia Preprocedure Evaluation (Signed)
Anesthesia Evaluation  Patient identified by MRN, date of birth, ID band Patient awake    Reviewed: Allergy & Precautions, NPO status , Patient's Chart, lab work & pertinent test results  Airway Mallampati: II       Dental  (+) Edentulous Upper, Edentulous Lower   Pulmonary sleep apnea , COPD, former smoker,     + decreased breath sounds      Cardiovascular Exercise Tolerance: Good hypertension, Pt. on home beta blockers and Pt. on medications + CAD and + Past MI  + pacemaker  Rhythm:Regular     Neuro/Psych negative psych ROS   GI/Hepatic negative GI ROS, Neg liver ROS,   Endo/Other  diabetes, Type 2, Oral Hypoglycemic AgentsMorbid obesity  Renal/GU negative Renal ROS     Musculoskeletal   Abdominal (+) + obese,   Peds  Hematology negative hematology ROS (+)   Anesthesia Other Findings   Reproductive/Obstetrics                             Anesthesia Physical Anesthesia Plan  ASA: III  Anesthesia Plan: MAC   Post-op Pain Management:    Induction: Intravenous  Airway Management Planned: Natural Airway and Nasal Cannula  Additional Equipment:   Intra-op Plan:   Post-operative Plan:   Informed Consent: I have reviewed the patients History and Physical, chart, labs and discussed the procedure including the risks, benefits and alternatives for the proposed anesthesia with the patient or authorized representative who has indicated his/her understanding and acceptance.     Plan Discussed with: CRNA  Anesthesia Plan Comments:         Anesthesia Quick Evaluation

## 2015-09-06 NOTE — Interval H&P Note (Signed)
History and Physical Interval Note:  09/06/2015 10:37 AM  Lance Nunez  has presented today for surgery, with the diagnosis of Chf and vt  The various methods of treatment have been discussed with the patient and family. After consideration of risks, benefits and other options for treatment, the patient has consented to  Procedure(s): ICD GENERATOR CHANGE (N/A) as a surgical intervention .  The patient's history has been reviewed, patient examined, no change in status, stable for surgery.  I have reviewed the patient's chart and labs.  Questions were answered to the patient's satisfaction.     [name]

## 2015-09-06 NOTE — OR Nursing (Signed)
ICD explanted PUL [id number] H [id number]

## 2015-09-06 NOTE — Op Note (Signed)
[facility] Cardiology   09/06/2015                     2:20 PM  PATIENT:  Lance Nunez    PRE-OPERATIVE DIAGNOSIS:  Chf and vt  POST-OPERATIVE DIAGNOSIS:  Same  PROCEDURE:  ICD GENERATOR CHANGE  SURGEON:  Lashya Passe, MD    ANESTHESIA:     PREOPERATIVE INDICATIONS:  [name] is a  65 y.o. male with a diagnosis of Chf and vt who failed conservative measures and elected for surgical management.    The risks benefits and alternatives were discussed with the patient preoperatively including but not limited to the risks of infection, bleeding, cardiopulmonary complications, the need for revision surgery, among others, and the patient was willing to proceed.   OPERATIVE PROCEDURE: Patient was brought to the operating room the fasting state. The left pectoral region was prepped and draped in usual sterile manner. Anesthesia was obtained 1% lidocaine locally. A 6 cm incision was performed a left pectoral region. The old ICD generator was retrieved by electrocautery and blunt dissection. The leads were disconnected and connected to a new chamber ICD generator (Medtronic Evera MRI XT DR SureScan). Pacemaker pocket was irrigated with gentamicin solution. The new ICD generator was positioned into the pocket and the pocket was closed with 2-0 and 4-0 Vicryl, respectively. Steri-Strips and a pressure dressing were applied.

## 2015-09-10 ENCOUNTER — Encounter: Payer: Self-pay | Admitting: Emergency Medicine

## 2015-09-10 ENCOUNTER — Emergency Department: Payer: Medicare HMO

## 2015-09-10 ENCOUNTER — Observation Stay
Admission: EM | Admit: 2015-09-10 | Discharge: 2015-09-13 | Disposition: A | Discharge status: Home / Self Care | Payer: Medicare HMO | Attending: Internal Medicine | Admitting: Internal Medicine

## 2015-09-10 DIAGNOSIS — E119 Type 2 diabetes mellitus without complications: Secondary | ICD-10-CM | POA: Insufficient documentation

## 2015-09-10 DIAGNOSIS — I110 Hypertensive heart disease with heart failure: Secondary | ICD-10-CM | POA: Insufficient documentation

## 2015-09-10 DIAGNOSIS — Z87891 Personal history of nicotine dependence: Secondary | ICD-10-CM | POA: Insufficient documentation

## 2015-09-10 DIAGNOSIS — I5022 Chronic systolic (congestive) heart failure: Secondary | ICD-10-CM | POA: Insufficient documentation

## 2015-09-10 DIAGNOSIS — Z888 Allergy status to other drugs, medicaments and biological substances status: Secondary | ICD-10-CM | POA: Diagnosis not present

## 2015-09-10 DIAGNOSIS — T82118A Breakdown (mechanical) of other cardiac electronic device, initial encounter: Principal | ICD-10-CM | POA: Insufficient documentation

## 2015-09-10 DIAGNOSIS — Z79899 Other long term (current) drug therapy: Secondary | ICD-10-CM | POA: Insufficient documentation

## 2015-09-10 DIAGNOSIS — X58XXXA Exposure to other specified factors, initial encounter: Secondary | ICD-10-CM | POA: Diagnosis not present

## 2015-09-10 DIAGNOSIS — T82198A Other mechanical complication of other cardiac electronic device, initial encounter: Secondary | ICD-10-CM | POA: Diagnosis present

## 2015-09-10 DIAGNOSIS — Z7984 Long term (current) use of oral hypoglycemic drugs: Secondary | ICD-10-CM | POA: Diagnosis not present

## 2015-09-10 DIAGNOSIS — T82110A Breakdown (mechanical) of cardiac electrode, initial encounter: Secondary | ICD-10-CM

## 2015-09-10 DIAGNOSIS — I2510 Atherosclerotic heart disease of native coronary artery without angina pectoris: Secondary | ICD-10-CM | POA: Insufficient documentation

## 2015-09-10 DIAGNOSIS — Z885 Allergy status to narcotic agent status: Secondary | ICD-10-CM | POA: Insufficient documentation

## 2015-09-10 DIAGNOSIS — Z9889 Other specified postprocedural states: Secondary | ICD-10-CM | POA: Diagnosis not present

## 2015-09-10 DIAGNOSIS — I10 Essential (primary) hypertension: Secondary | ICD-10-CM

## 2015-09-10 DIAGNOSIS — I420 Dilated cardiomyopathy: Secondary | ICD-10-CM | POA: Insufficient documentation

## 2015-09-10 DIAGNOSIS — I252 Old myocardial infarction: Secondary | ICD-10-CM | POA: Diagnosis not present

## 2015-09-10 DIAGNOSIS — I255 Ischemic cardiomyopathy: Secondary | ICD-10-CM | POA: Insufficient documentation

## 2015-09-10 DIAGNOSIS — J449 Chronic obstructive pulmonary disease, unspecified: Secondary | ICD-10-CM | POA: Diagnosis not present

## 2015-09-10 DIAGNOSIS — Z7982 Long term (current) use of aspirin: Secondary | ICD-10-CM | POA: Diagnosis not present

## 2015-09-10 LAB — CBC WITH DIFFERENTIAL/PLATELET
Basophils Absolute: 0.1 10*3/uL (ref 0–0.1)
Basophils Relative: 1 %
Eosinophils Absolute: 0.2 10*3/uL (ref 0–0.7)
Eosinophils Relative: 3 %
HEMATOCRIT: 41.4 % (ref 40.0–52.0)
HEMOGLOBIN: 14.3 g/dL (ref 13.0–18.0)
LYMPHS ABS: 2.5 10*3/uL (ref 1.0–3.6)
LYMPHS PCT: 30 %
MCH: 31.3 pg (ref 26.0–34.0)
MCHC: 34.6 g/dL (ref 32.0–36.0)
MCV: 90.4 fL (ref 80.0–100.0)
MONOS PCT: 7 %
Monocytes Absolute: 0.6 10*3/uL (ref 0.2–1.0)
NEUTROS ABS: 4.9 10*3/uL (ref 1.4–6.5)
NEUTROS PCT: 59 %
Platelets: 204 10*3/uL (ref 150–440)
RBC: 4.58 MIL/uL (ref 4.40–5.90)
RDW: 14.4 % (ref 11.5–14.5)
WBC: 8.3 10*3/uL (ref 3.8–10.6)

## 2015-09-10 LAB — BASIC METABOLIC PANEL
Anion gap: 7 (ref 5–15)
BUN: 23 mg/dL — AB (ref 6–20)
CHLORIDE: 104 mmol/L (ref 101–111)
CO2: 25 mmol/L (ref 22–32)
CREATININE: 1.44 mg/dL — AB (ref 0.61–1.24)
Calcium: 9 mg/dL (ref 8.9–10.3)
GFR calc Af Amer: 57 mL/min — ABNORMAL LOW (ref >60)
GFR calc non Af Amer: 50 mL/min — ABNORMAL LOW (ref >60)
Glucose, Bld: 91 mg/dL (ref 65–99)
Potassium: 4.6 mmol/L (ref 3.5–5.1)
Sodium: 136 mmol/L (ref 135–145)

## 2015-09-10 LAB — GLUCOSE, CAPILLARY: GLUCOSE-CAPILLARY: 128 mg/dL — AB (ref 65–99)

## 2015-09-10 LAB — TROPONIN I: Troponin I: <0.03 ng/mL (ref <0.03)

## 2015-09-10 MED ORDER — FAMOTIDINE 20 MG PO TABS
20.0000 mg | ORAL_TABLET | Freq: Two times a day (BID) | ORAL | Status: DC
  Administered 2015-09-10 – 2015-09-12 (×5): 20 mg via ORAL
  Filled 2015-09-10 (×6): qty 1

## 2015-09-10 MED ORDER — SPIRONOLACTONE 25 MG PO TABS
12.5000 mg | ORAL_TABLET | Freq: Every day | ORAL | Status: DC
  Administered 2015-09-10 – 2015-09-12 (×3): 12.5 mg via ORAL
  Filled 2015-09-10: qty 2
  Filled 2015-09-10 (×3): qty 1

## 2015-09-10 MED ORDER — DOCUSATE SODIUM 100 MG PO CAPS
100.0000 mg | ORAL_CAPSULE | Freq: Two times a day (BID) | ORAL | Status: DC
  Administered 2015-09-10 – 2015-09-12 (×4): 100 mg via ORAL
  Filled 2015-09-10 (×4): qty 1

## 2015-09-10 MED ORDER — SODIUM CHLORIDE 0.9 % IV SOLN
INTRAVENOUS | Status: DC
  Administered 2015-09-10 – 2015-09-11 (×2): via INTRAVENOUS

## 2015-09-10 MED ORDER — ONDANSETRON HCL 4 MG PO TABS: 4.0000 mg | ORAL_TABLET | Freq: Four times a day (QID) | ORAL | Status: DC | PRN

## 2015-09-10 MED ORDER — SODIUM CHLORIDE 0.9 % IR SOLN
80.0000 mg | Status: AC
  Filled 2015-09-10: qty 2

## 2015-09-10 MED ORDER — LISINOPRIL 10 MG PO TABS
10.0000 mg | ORAL_TABLET | Freq: Every day | ORAL | Status: DC
  Administered 2015-09-11 – 2015-09-12 (×2): 10 mg via ORAL
  Filled 2015-09-10 (×3): qty 1

## 2015-09-10 MED ORDER — PRAVASTATIN SODIUM 40 MG PO TABS
80.0000 mg | ORAL_TABLET | Freq: Every day | ORAL | Status: DC
  Administered 2015-09-10 – 2015-09-12 (×3): 80 mg via ORAL
  Filled 2015-09-10 (×3): qty 2

## 2015-09-10 MED ORDER — ACETAMINOPHEN 325 MG PO TABS: 650.0000 mg | ORAL_TABLET | Freq: Four times a day (QID) | ORAL | Status: DC | PRN

## 2015-09-10 MED ORDER — GEMFIBROZIL 600 MG PO TABS
600.0000 mg | ORAL_TABLET | Freq: Two times a day (BID) | ORAL | Status: DC
  Administered 2015-09-11 – 2015-09-12 (×4): 600 mg via ORAL
  Filled 2015-09-10 (×5): qty 1

## 2015-09-10 MED ORDER — GABAPENTIN 600 MG PO TABS
600.0000 mg | ORAL_TABLET | Freq: Three times a day (TID) | ORAL | Status: DC
  Administered 2015-09-10 – 2015-09-12 (×7): 600 mg via ORAL
  Filled 2015-09-10 (×8): qty 1

## 2015-09-10 MED ORDER — HEPARIN SODIUM (PORCINE) 5000 UNIT/ML IJ SOLN
5000.0000 [IU] | Freq: Three times a day (TID) | INTRAMUSCULAR | Status: DC
  Administered 2015-09-10: 5000 [IU] via SUBCUTANEOUS
  Filled 2015-09-10: qty 1

## 2015-09-10 MED ORDER — CARVEDILOL 6.25 MG PO TABS
6.2500 mg | ORAL_TABLET | Freq: Two times a day (BID) | ORAL | Status: DC
  Administered 2015-09-10 – 2015-09-13 (×6): 6.25 mg via ORAL
  Filled 2015-09-10 (×6): qty 1

## 2015-09-10 MED ORDER — NITROGLYCERIN 0.4 MG SL SUBL: 0.4000 mg | SUBLINGUAL_TABLET | SUBLINGUAL | Status: DC | PRN

## 2015-09-10 MED ORDER — SUCRALFATE 1 G PO TABS
1.0000 g | ORAL_TABLET | Freq: Three times a day (TID) | ORAL | Status: DC
  Administered 2015-09-10 – 2015-09-12 (×7): 1 g via ORAL
  Filled 2015-09-10 (×7): qty 1

## 2015-09-10 MED ORDER — SODIUM CHLORIDE 0.9% FLUSH
3.0000 mL | Freq: Two times a day (BID) | INTRAVENOUS | Status: DC
Start: 2015-09-10 — End: 2015-09-13
  Administered 2015-09-10 – 2015-09-13 (×5): 3 mL via INTRAVENOUS

## 2015-09-10 MED ORDER — CEFAZOLIN SODIUM-DEXTROSE 2-4 GM/100ML-% IV SOLN
2.0000 g | INTRAVENOUS | Status: AC
  Filled 2015-09-10: qty 100

## 2015-09-10 MED ORDER — GLIPIZIDE 10 MG PO TABS
10.0000 mg | ORAL_TABLET | Freq: Every day | ORAL | Status: DC
  Administered 2015-09-11 – 2015-09-12 (×2): 10 mg via ORAL
  Filled 2015-09-10 (×2): qty 1

## 2015-09-10 MED ORDER — ASPIRIN EC 81 MG PO TBEC
81.0000 mg | DELAYED_RELEASE_TABLET | Freq: Every day | ORAL | Status: DC
  Administered 2015-09-11 – 2015-09-12 (×2): 81 mg via ORAL
  Filled 2015-09-10 (×3): qty 1

## 2015-09-10 MED ORDER — ACETAMINOPHEN 650 MG RE SUPP: 650.0000 mg | Freq: Four times a day (QID) | RECTAL | Status: DC | PRN

## 2015-09-10 MED ORDER — SODIUM CHLORIDE 0.9 % IV SOLN: INTRAVENOUS | Status: DC

## 2015-09-10 MED ORDER — VERAPAMIL HCL ER 180 MG PO TBCR
180.0000 mg | EXTENDED_RELEASE_TABLET | Freq: Every day | ORAL | Status: DC
  Administered 2015-09-10 – 2015-09-12 (×2): 180 mg via ORAL
  Filled 2015-09-10 (×3): qty 1

## 2015-09-10 MED ORDER — BISACODYL 10 MG RE SUPP: 10.0000 mg | Freq: Every day | RECTAL | Status: DC | PRN

## 2015-09-10 MED ORDER — INSULIN ASPART 100 UNIT/ML ~~LOC~~ SOLN
0.0000 [IU] | Freq: Three times a day (TID) | SUBCUTANEOUS | Status: DC
  Administered 2015-09-11 – 2015-09-13 (×3): 1 [IU] via SUBCUTANEOUS
  Administered 2015-09-13: 2 [IU] via SUBCUTANEOUS
  Filled 2015-09-10: qty 1
  Filled 2015-09-10: qty 2
  Filled 2015-09-10: qty 1

## 2015-09-10 MED ORDER — ONDANSETRON HCL 4 MG/2ML IJ SOLN: 4.0000 mg | Freq: Four times a day (QID) | INTRAMUSCULAR | Status: DC | PRN

## 2015-09-10 NOTE — Consult Note (Signed)
[facility] Cardiology  CARDIOLOGY CONSULT NOTE  Patient ID: Lance Nunez MRN: [id number] DOB/AGE: Jul 27, 1950 65 y.o.  Admit date: 09/10/2015 Referring Physician  Primary Physician [place] Primary Cardiologist Nehemiah Massed Reason for Consultation ICD malfunction  HPI: 65 year old gentleman referred for ICD malfunction. Patient has known ischemic heart disease, ischemic cardiomyopathy, with chronic systolic congestive heart failure. The patient is status post dual-chamber ICD. He underwent recent ICD generator change out. Today, the patient started to hear alarms from his ICD. ICD interrogation revealed high impedance for the RV coil lead, suggestive of lead fracture versus loose connection. Chest x-ray was performed which did not reveal obvious lead  fracture.  Review of systems complete and found to be negative unless listed above     Past Medical History:  Diagnosis Date  . COPD (chronic obstructive pulmonary disease) ([place])   . Coronary artery disease   . Diabetes mellitus without complication ([place])   . Hypertension   . Myocardial infarction [facility])    95' 97' 98'    Past Surgical History:  Procedure Laterality Date  . Fractured leg Right   . IMPLANTABLE CARDIOVERTER DEFIBRILLATOR (ICD) GENERATOR CHANGE N/A 09/06/2015   Procedure: ICD GENERATOR CHANGE;  Surgeon: Isaias Cowman, MD;  Location: [facility] ORS;  Service: Cardiovascular;  Laterality: N/A;  . Stents N/A    4 cardiac stents     (Not in a hospital admission) Social History   Social History  . Marital status: Widowed    Spouse name: N/A  . Number of children: N/A  . Years of education: N/A   Occupational History  . Not on file.   Social History Main Topics  . Smoking status: Former Smoker    Quit date: 07/19/2004  . Smokeless tobacco: Not on file  . Alcohol use No  . Drug use: No  . Sexual activity: Not on file   Other Topics Concern  . Not on file   Social History Narrative  . No narrative on file    History  reviewed. No pertinent family history.    Review of systems complete and found to be negative unless listed above      PHYSICAL EXAM  General: Well developed, well nourished, in no acute distress HEENT:  Normocephalic and atramatic Neck:  No JVD.  Lungs: Clear bilaterally to auscultation and percussion. Heart: HRRR . Normal S1 and S2 without gallops or murmurs.  Abdomen: Bowel sounds are positive, abdomen soft and non-tender  Msk:  Back normal, normal gait. Normal strength and tone for age. Extremities: No clubbing, cyanosis or edema.   Neuro: Alert and oriented X 3. Psych:  Good affect, responds appropriately  Labs:   Lab Results  Component Value Date   WBC 8.1 08/29/2015   HGB 14.6 08/29/2015   HCT 42.5 08/29/2015   MCV 90.7 08/29/2015   PLT 180 08/29/2015   No results for input(s): NA, K, CL, CO2, BUN, CREATININE, CALCIUM, PROT, BILITOT, ALKPHOS, ALT, AST, GLUCOSE in the last 168 hours.  Invalid input(s): LABALBU Lab Results  Component Value Date   CKTOTAL 486 (H) 12/13/2012   CKMB 9.9 (H) 12/13/2012   TROPONINI <0.03 07/20/2014    Lab Results  Component Value Date   CHOL 160 12/13/2012   CHOL 159 10/23/2011   Lab Results  Component Value Date   HDL 33 (L) 12/13/2012   HDL 30 (L) 10/23/2011   Lab Results  Component Value Date   LDLCALC 61 12/13/2012   [place] SEE COMMENT 10/23/2011   Lab Results  Component Value Date   TRIG 328 (H) 12/13/2012   TRIG 568 (H) 10/23/2011   No results found for: CHOLHDL No results found for: LDLDIRECT    Radiology: Dg Chest 2 View  Result Date: 08/29/2015 CLINICAL DATA:  Pacer change out EXAM: CHEST  2 VIEW COMPARISON:  07/20/2014 FINDINGS: There is a left chest wall ICD with leads in the right atrial appendage and right ventricle. The heart size appears within normal limits. There are scar like densities within both lungs including the left midlung and both lower lobes. No pleural effusion or edema. IMPRESSION: 1. No  acute cardiopulmonary abnormalities. 2. Bibasilar scarring. Electronically Signed   By: Kerby Moors M.D.   On: 08/29/2015 10:31    EKG:   ASSESSMENT AND PLAN:   1. ICD alarm, likely due to senile setscrew of RV coil lead 2. CAD, currently without chest pain 3. Ischemic cardiomyopathy 4. Chronic systolic congestive heart failure 5. Type 2 diabetes  Recommendations  1. Continue current medications 2. Tentatively plan for ICD lead revision tomorrow. The risks, benefits and alternatives of ICD lead revision were explained to the patient and informed written consent was obtained. The patient remains clinically stable, and ICD lead revision is successful tomorrow, patient should be able to be discharged home following procedure.  SignedIsaias Cowman MD,PhD, [facility] 09/10/2015, 4:02 PM

## 2015-09-10 NOTE — ED Provider Notes (Signed)
[facility] Emergency Department Provider Note  ____________________________________________  Time seen: Approximately 5:30 PM  I have reviewed the triage vital signs and the nursing notes.   HISTORY  Chief Complaint Other (defib beeping and vibrating)   HPI Lance Nunez is a 65 y.o. male with a history of COPD and coronary artery disease with a recent pacemaker replacement who is presenting to the emergency department today with a displaced pacemaker lead.  Patient said that he felt his pacemaker buzzing and also making noise in church this morning. He has had no chest pain or otherwise any other concerning symptoms. He was met by his cardiologist, Dr. Tomasita Crumble shows, in the emergency department and recommended admission to the hospital for monitoring and then fixing the pacemaker in the morning.   Past Medical History:  Diagnosis Date  . COPD (chronic obstructive pulmonary disease) ([place])   . Coronary artery disease   . Diabetes mellitus without complication ([place])   . Hypertension   . Myocardial infarction [facility])    95' 97' 98'    There are no active problems to display for this patient.   Past Surgical History:  Procedure Laterality Date  . Fractured leg Right   . IMPLANTABLE CARDIOVERTER DEFIBRILLATOR (ICD) GENERATOR CHANGE N/A 09/06/2015   Procedure: ICD GENERATOR CHANGE;  Surgeon: Isaias Cowman, MD;  Location: [facility] ORS;  Service: Cardiovascular;  Laterality: N/A;  . Stents N/A    4 cardiac stents    Current Outpatient Rx  . Order #: [id number] Class: Historical Med  . Order #: [id number] Class: Historical Med  . Order #: [id number] Class: Print  . Order #: [id number] Class: Historical Med  . Order #: [id number] Class: Historical Med  . Order #: [id number] Class: Historical Med  . Order #: [id number] Class: Historical Med  . Order #: [id number] Class: Historical Med  . Order #: [id number] Class: Historical Med  . Order #: [id number] Class:  Historical Med  . Order #: [id number] Class: Historical Med  . Order #: [id number] Class: Historical Med  . Order #: [id number] Class: Historical Med  . Order #: [id number] Class: Historical Med    Allergies Axid [nizatidine] and Codeine  History reviewed. No pertinent family history.  Social History Social History  Substance Use Topics  . Smoking status: Former Smoker    Quit date: 07/19/2004  . Smokeless tobacco: Not on file  . Alcohol use No    Review of Systems Constitutional: No fever/chills Eyes: No visual changes. ENT: No sore throat. Cardiovascular: Denies chest pain. Respiratory: Denies shortness of breath. Gastrointestinal: No abdominal pain.  No nausea, no vomiting.  No diarrhea.  No constipation. Genitourinary: Negative for dysuria. Musculoskeletal: Negative for back pain. Skin: Negative for rash. Neurological: Negative for headaches, focal weakness or numbness.  10-point ROS otherwise negative.  ____________________________________________   PHYSICAL EXAM:  VITAL SIGNS: ED Triage Vitals [09/10/15 1306]  Enc Vitals Group     BP 123/80     Pulse Rate 75     Resp 20     Temp 97.9 F (36.6 C)     Temp Source Oral     SpO2 95 %     Weight 204 lb (92.5 kg)     Height 5' 4"  (1.626 m)     Head Circumference      Peak Flow      Pain Score      Pain Loc      Pain Edu?      Excl. in [place]?     Constitutional: Alert and oriented. Well appearing  and in no acute distress. Eyes: Conjunctivae are normal. PERRL. EOMI. Head: Atraumatic. Nose: No congestion/rhinnorhea. Mouth/Throat: Mucous membranes are moist.   Neck: No stridor.   Cardiovascular: Normal rate, regular rhythm. Grossly normal heart sounds. Pacemaker pocket is nontender without any erythema or induration surrounding. Respiratory: Normal respiratory effort.  No retractions. Lungs CTAB. Gastrointestinal: Soft and nontender. No distention.  No CVA tenderness. Musculoskeletal: No lower extremity tenderness  nor edema.  No joint effusions. Neurologic:  Normal speech and language. No gross focal neurologic deficits are appreciated. Skin:  Skin is warm, dry and intact. No rash noted. Psychiatric: Mood and affect are normal. Speech and behavior are normal.  ____________________________________________   LABS (all labs ordered are listed, but only abnormal results are displayed)  Labs Reviewed  CBC WITH DIFFERENTIAL/PLATELET  BASIC METABOLIC PANEL  TROPONIN I   ____________________________________________  EKG  ED ECG REPORT I, Niya Behler,  Youlanda Roys, the attending physician, personally viewed and interpreted this ECG.   Date: 09/10/2015  EKG Time: 1306  Rate: 72  Rhythm: normal sinus rhythm  Axis: Normal  Intervals:none  ST&T Change: No ST segment elevation or depression. T-wave inversion in V6.  ____________________________________________  RADIOLOGY CLINICAL DATA:  66 year old male with defibrillator malfunction. EXAM: PORTABLE CHEST 1 VIEW COMPARISON:  08/29/2015 and prior chest radiographs FINDINGS: Heart size is upper limits of normal. Left dual chamber ICD is unchanged without wire fracture. The leads overlie the regions of the right atrium right ventricle. Mild right basilar scarring is again noted. There is no evidence of pleural effusion or pneumothorax. IMPRESSION: Unchanged appearance of left ICD without definite complicating features. No definite evidence of lead fracture. Electronically Signed   By: Margarette Canada M.D.   On: 09/10/2015 16:31  ____________________________________________   PROCEDURES   Procedures  ____________________________________________   INITIAL IMPRESSION / ASSESSMENT AND PLAN / ED COURSE  Pertinent labs & imaging results that were available during my care of the patient were reviewed by me and considered in my medical decision making (see chart for details).  ----------------------------------------- 5:48 PM on  09/10/2015 -----------------------------------------  Patient on pacer pads on monitor. No arrhythmia. Will be admitted to the hospital. Patient understands plan 1 to comply. Signed out to Dr. Doy Hutching. ____________________________________________   FINAL CLINICAL IMPRESSION(S) / ED DIAGNOSES  Defibrillator malfunction.    NEW MEDICATIONS STARTED DURING THIS VISIT:  New Prescriptions   No medications on file     Note:  This document was prepared using Dragon voice recognition software and may include unintentional dictation errors.    Orbie Pyo, MD 09/10/15 [phone]

## 2015-09-10 NOTE — ED Triage Notes (Signed)
defib interrogated in triage

## 2015-09-10 NOTE — H&P (Signed)
History and Physical    Lance Nunez [id number] DOB: 03/28/1950 DOA: 09/10/2015  Referring physician: Dr. Clearnce Hasten PCP: [facility]  Specialists: Dr. Saralyn Pilar  Chief Complaint: chest vibrations  HPI: Lance Nunez is a 65 y.o. male has a past medical history significant for CAD, COPD, DM, and dilated cardiomyopathy s/o ICD placement now with vibrations and "buzzing" from his chest. Denies pain or SOB. Was evaluated by Cardiology in the ER and found to have a faulty lead. He is now admitted.  Review of Systems: The patient denies anorexia, fever, weight loss,, vision loss, decreased hearing, hoarseness, chest pain, syncope, dyspnea on exertion, peripheral edema, balance deficits, hemoptysis, abdominal pain, melena, hematochezia, severe indigestion/heartburn, hematuria, incontinence, genital sores, muscle weakness, suspicious skin lesions, transient blindness, difficulty walking, depression, unusual weight change, abnormal bleeding, enlarged lymph nodes, angioedema, and breast masses.   Past Medical History:  Diagnosis Date  . COPD (chronic obstructive pulmonary disease) ([place])   . Coronary artery disease   . Diabetes mellitus without complication ([place])   . Hypertension   . Myocardial infarction [facility])    95' 97' 98'   Past Surgical History:  Procedure Laterality Date  . Fractured leg Right   . IMPLANTABLE CARDIOVERTER DEFIBRILLATOR (ICD) GENERATOR CHANGE N/A 09/06/2015   Procedure: ICD GENERATOR CHANGE;  Surgeon: Isaias Cowman, MD;  Location: [facility] ORS;  Service: Cardiovascular;  Laterality: N/A;  . Stents N/A    4 cardiac stents   Social History:  reports that he quit smoking about 11 years ago. He has never used smokeless tobacco. He reports that he does not drink alcohol or use drugs.  Allergies  Allergen Reactions  . Axid [Nizatidine]   . Codeine Itching    History reviewed. No pertinent family history.  Prior to Admission medications    Medication Sig Start Date End Date Taking? Authorizing Provider  aspirin 81 MG tablet Take 81 mg by mouth daily.    Historical Provider, MD  carvedilol (COREG) 6.25 MG tablet Take 6.25 mg by mouth 2 (two) times daily with a meal.    Historical Provider, MD  cephALEXin (KEFLEX) 250 MG capsule Take 1 capsule (250 mg total) by mouth 4 (four) times daily. 09/06/15   Isaias Cowman, MD  gabapentin (NEURONTIN) 600 MG tablet Take 600 mg by mouth 3 (three) times daily.    Historical Provider, MD  gemfibrozil (LOPID) 600 MG tablet Take 600 mg by mouth 2 (two) times daily before a meal.    Historical Provider, MD  glipiZIDE (GLUCOTROL) 10 MG tablet Take 10 mg by mouth daily before breakfast.    Historical Provider, MD  lovastatin (MEVACOR) 40 MG tablet Take 80 mg by mouth at bedtime.     Historical Provider, MD  metFORMIN (GLUCOPHAGE) 500 MG tablet Take 500 mg by mouth daily at 6 (six) AM.    Historical Provider, MD  nitroGLYCERIN (NITROSTAT) 0.4 MG SL tablet Place 0.4 mg under the tongue every 5 (five) minutes as needed for chest pain.    Historical Provider, MD  quinapril (ACCUPRIL) 10 MG tablet Take 10 mg by mouth daily.    Historical Provider, MD  ranitidine (ZANTAC) 150 MG tablet Take 150 mg by mouth 2 (two) times daily.    Historical Provider, MD  spironolactone (ALDACTONE) 25 MG tablet Take 12.5 mg by mouth daily.    Historical Provider, MD  sucralfate (CARAFATE) 1 g tablet Take 1 g by mouth 3 (three) times daily.    Historical Provider, MD  verapamil (CALAN-SR) 180 MG CR tablet Take 180 mg by mouth at bedtime.    Historical Provider, MD   Physical Exam: Vitals:   09/10/15 1306 09/10/15 1700  BP: 123/80   Pulse: 75 69  Resp: 20 20  Temp: 97.9 F (36.6 C)   TempSrc: Oral   SpO2: 95% 96%  Weight: 92.5 kg (204 lb)   Height: 5\' 4"  (1.626 m)      General:  No apparent distress, WDWN, [place]/AT  Eyes: PERRL, EOMI, no scleral icterus, conjunctiva clear  ENT: moist oropharynx without  exudate, TM's benign, dentition fair  Neck: supple, no lymphadenopathy. No bruits or thyromegaly  Cardiovascular: regular rate without MRG; 2+ peripheral pulses, no JVD, 1+ peripheral edema  Respiratory: CTA biL, good air movement without wheezing, rhonchi or crackled. Respiratory effort normal  Abdomen: soft, non tender to palpation, positive bowel sounds, no guarding, no rebound  Skin: no rashes or lesions  Musculoskeletal: normal bulk and tone, no joint swelling  Psychiatric: normal mood and affect, A&OX3  Neurologic: CN 2-12 grossly intact, Motor strength 5/5 in all 4 groups with symmetric DTR's and non-focal sensory exam  Labs on Admission:  Basic Metabolic Panel:  Recent Labs Lab 09/10/15 1637  NA 136  K 4.6  CL 104  CO2 25  GLUCOSE 91  BUN 23*  CREATININE 1.44*  CALCIUM 9.0   Liver Function Tests: No results for input(s): AST, ALT, ALKPHOS, BILITOT, PROT, ALBUMIN in the last 168 hours. No results for input(s): LIPASE, AMYLASE in the last 168 hours. No results for input(s): AMMONIA in the last 168 hours. CBC:  Recent Labs Lab 09/10/15 1637  WBC 8.3  NEUTROABS 4.9  HGB 14.3  HCT 41.4  MCV 90.4  PLT 204   Cardiac Enzymes:  Recent Labs Lab 09/10/15 1637  TROPONINI <0.03    BNP (last 3 results) No results for input(s): BNP in the last 8760 hours.  ProBNP (last 3 results) No results for input(s): PROBNP in the last 8760 hours.  CBG:  Recent Labs Lab 09/06/15 1221 09/06/15 1433  GLUCAP 144* 130*    Radiological Exams on Admission: Dg Chest Portable 1 View  Result Date: 09/10/2015 CLINICAL DATA:  65 year old male with defibrillator malfunction. EXAM: PORTABLE CHEST 1 VIEW COMPARISON:  08/29/2015 and prior chest radiographs FINDINGS: Heart size is upper limits of normal. Left dual chamber ICD is unchanged without wire fracture. The leads overlie the regions of the right atrium right ventricle. Mild right basilar scarring is again noted. There  is no evidence of pleural effusion or pneumothorax. IMPRESSION: Unchanged appearance of left ICD without definite complicating features. No definite evidence of lead fracture. Electronically Signed   By: Margarette Canada M.D.   On: 09/10/2015 16:31   EKG: Independently reviewed.  Assessment/Plan Principal Problem:   ICD (implantable cardioverter-defibrillator) lead failure Active Problems:   Dilated cardiomyopathy (HCC)   Diabetes mellitus without complication (HCC)   HTN, goal below 140/80   Will observe on telemetry. Plan ICD revision tomorrow per Cardiology. Follow sugars and BP closely.  Diet: low salt Fluids: NS@75  DVT Prophylaxis: SQ Heparin  Code Status: FULL  Family Communication: yes  Disposition Plan: home  Time spent: 50 min

## 2015-09-10 NOTE — ED Notes (Signed)
Report given to [facility] RN, 2A

## 2015-09-10 NOTE — ED Triage Notes (Signed)
Pt had new defib placed on Wednesday and for past 2 days has heard it randomly beeping and when it does this he can put hand over it and feel it vibrate. Feels ok, no other sx, has not felt shock.

## 2015-09-11 LAB — COMPREHENSIVE METABOLIC PANEL
ALT: 29 U/L (ref 17–63)
ANION GAP: 7 (ref 5–15)
AST: 27 U/L (ref 15–41)
Albumin: 3.8 g/dL (ref 3.5–5.0)
Alkaline Phosphatase: 68 U/L (ref 38–126)
BUN: 22 mg/dL — ABNORMAL HIGH (ref 6–20)
CHLORIDE: 105 mmol/L (ref 101–111)
CO2: 28 mmol/L (ref 22–32)
Calcium: 8.9 mg/dL (ref 8.9–10.3)
Creatinine, Ser: 1.36 mg/dL — ABNORMAL HIGH (ref 0.61–1.24)
GFR calc non Af Amer: 53 mL/min — ABNORMAL LOW (ref >60)
Glucose, Bld: 102 mg/dL — ABNORMAL HIGH (ref 65–99)
Potassium: 4.5 mmol/L (ref 3.5–5.1)
SODIUM: 140 mmol/L (ref 135–145)
Total Bilirubin: 0.8 mg/dL (ref 0.3–1.2)
Total Protein: 7 g/dL (ref 6.5–8.1)

## 2015-09-11 LAB — CBC
HCT: 41.4 % (ref 40.0–52.0)
HEMOGLOBIN: 14.3 g/dL (ref 13.0–18.0)
MCH: 31.7 pg (ref 26.0–34.0)
MCHC: 34.4 g/dL (ref 32.0–36.0)
MCV: 92.1 fL (ref 80.0–100.0)
Platelets: 193 10*3/uL (ref 150–440)
RBC: 4.5 MIL/uL (ref 4.40–5.90)
RDW: 14.5 % (ref 11.5–14.5)
WBC: 6.5 10*3/uL (ref 3.8–10.6)

## 2015-09-11 LAB — GLUCOSE, CAPILLARY
Glucose-Capillary: 146 mg/dL — ABNORMAL HIGH (ref 65–99)
Glucose-Capillary: 144 mg/dL — ABNORMAL HIGH (ref 65–99)
Glucose-Capillary: 137 mg/dL — ABNORMAL HIGH (ref 65–99)
Glucose-Capillary: 73 mg/dL (ref 65–99)

## 2015-09-11 MED ORDER — SODIUM CHLORIDE 0.9 % IV SOLN
INTRAVENOUS | Status: DC
  Administered 2015-09-13: 11:00:00 via INTRAVENOUS

## 2015-09-11 NOTE — Progress Notes (Signed)
Pt complains of ICD vibrating again. Left chest dressing is clean, dry and intact. Upon palpation no vibration noted. MD notified. MD recommends contacting medtronic for interrogation. I will continue to assess.

## 2015-09-11 NOTE — Care Management Obs Status (Signed)
[facility] NOTIFICATION   Patient Details  Name: Lance Nunez MRN: [id number] Date of Birth: Sep 09, 1950   Medicare Observation Status Notification Given:  Yes    Jolly Mango, RN 09/11/2015, 10:00 AM

## 2015-09-11 NOTE — Progress Notes (Signed)
[facility]  SUBJECTIVE: I don't have chest pain   Vitals:   09/10/15 1306 09/10/15 1700 09/10/15 1902 09/11/15 0414  BP: 123/80  (!) 153/95 119/61  Pulse: 75 69 71 63  Resp: 20 20 18 20   Temp: 97.9 F (36.6 C)  97.7 F (36.5 C) 97.6 F (36.4 C)  TempSrc: Oral  Oral   SpO2: 95% 96% 97% 91%  Weight: 92.5 kg (204 lb)  97.4 kg (214 lb 11.2 oz) 97.5 kg (214 lb 14.4 oz)  Height: 5\' 4"  (1.626 m)  5\' 4"  (1.626 m)      Intake/Output Summary (Last 24 hours) at 09/11/15 [id number] Last data filed at 09/11/15 0425  Gross per 24 hour  Intake           823.33 ml  Output              850 ml  Net           -26.67 ml      PHYSICAL EXAM  General: Well developed, well nourished, in no acute distress HEENT:  Normocephalic and atramatic Neck:  No JVD.  Lungs: Clear bilaterally to auscultation and percussion. Heart: HRRR . Normal S1 and S2 without gallops or murmurs.  Abdomen: Bowel sounds are positive, abdomen soft and non-tender  Msk:  Back normal, normal gait. Normal strength and tone for age. Extremities: No clubbing, cyanosis or edema.   Neuro: Alert and oriented X 3. Psych:  Good affect, responds appropriately   LABS: Basic Metabolic Panel:  Recent Labs  09/10/15 1637 09/11/15 0533  NA 136 140  K 4.6 4.5  CL 104 105  CO2 25 28  GLUCOSE 91 102*  BUN 23* 22*  CREATININE 1.44* 1.36*  CALCIUM 9.0 8.9   Liver Function Tests:  Recent Labs  09/11/15 0533  AST 27  ALT 29  ALKPHOS 68  BILITOT 0.8  PROT 7.0  ALBUMIN 3.8   No results for input(s): LIPASE, AMYLASE in the last 72 hours. CBC:  Recent Labs  09/10/15 1637 09/11/15 0533  WBC 8.3 6.5  NEUTROABS 4.9  --   HGB 14.3 14.3  HCT 41.4 41.4  MCV 90.4 92.1  PLT 204 193   Cardiac Enzymes:  Recent Labs  09/10/15 1637  TROPONINI <0.03   BNP: Invalid input(s): POCBNP D-Dimer: No results for input(s): DDIMER in the last 72 hours. Hemoglobin A1C: No results for input(s): HGBA1C in the last 72  hours. Fasting Lipid Panel: No results for input(s): CHOL, HDL, LDLCALC, TRIG, CHOLHDL, LDLDIRECT in the last 72 hours. Thyroid Function Tests: No results for input(s): TSH, T4TOTAL, T3FREE, THYROIDAB in the last 72 hours.  Invalid input(s): FREET3 Anemia Panel: No results for input(s): VITAMINB12, FOLATE, FERRITIN, TIBC, IRON, RETICCTPCT in the last 72 hours.  Dg Chest Portable 1 View  Result Date: 09/10/2015 CLINICAL DATA:  65 year old male with defibrillator malfunction. EXAM: PORTABLE CHEST 1 VIEW COMPARISON:  08/29/2015 and prior chest radiographs FINDINGS: Heart size is upper limits of normal. Left dual chamber ICD is unchanged without wire fracture. The leads overlie the regions of the right atrium right ventricle. Mild right basilar scarring is again noted. There is no evidence of pleural effusion or pneumothorax. IMPRESSION: Unchanged appearance of left ICD without definite complicating features. No definite evidence of lead fracture. Electronically Signed   By: Margarette Canada M.D.   On: 09/10/2015 16:31    Echo  TELEMETRY: Normal sinus rhythm:  ASSESSMENT AND PLAN:  Principal Problem:   ICD (implantable  cardioverter-defibrillator) lead failure Active Problems:   Dilated cardiomyopathy (HCC)   Diabetes mellitus without complication (HCC)   HTN, goal below 140/80    1. ICD malfunction, faulty RV coil lead likely loose set screw 2. CAD 3. Ischemic cardiomyopathy 4. Chronic systolic congestive heart failure 5. Type 2 diabetes  Recommendations  1. Continue current medications 2. Revise RV coil lead   Yareliz Thorstenson, MD, PhD, [facility] 09/11/2015 8:12 AM

## 2015-09-11 NOTE — Progress Notes (Signed)
Called OR no procedures today. DIscused with patient and Dr Lorinda Creed.  Ok to eat. NPO and IVF after MN ordered.

## 2015-09-11 NOTE — Progress Notes (Signed)
[place] at [place] NAME: Lance Nunez    MR#:  [id number]  DATE OF BIRTH:  09-Mar-1950  SUBJECTIVE:    Patient doing well this morning. Denies chest pain or shortness of breath. Denies dizziness.  REVIEW OF SYSTEMS:    Review of Systems  Constitutional: Negative.  Negative for chills, fever and malaise/fatigue.  HENT: Negative.  Negative for ear discharge, ear pain, hearing loss, nosebleeds and sore throat.   Eyes: Negative.  Negative for blurred vision and pain.  Respiratory: Negative.  Negative for cough, hemoptysis, shortness of breath and wheezing.   Cardiovascular: Negative.  Negative for chest pain, palpitations and leg swelling.  Gastrointestinal: Negative.  Negative for abdominal pain, blood in stool, diarrhea, nausea and vomiting.  Genitourinary: Negative.  Negative for dysuria.  Musculoskeletal: Negative.  Negative for back pain.  Skin: Negative.   Neurological: Negative for dizziness, tremors, speech change, focal weakness, seizures and headaches.  Endo/Heme/Allergies: Negative.  Does not bruise/bleed easily.  Psychiatric/Behavioral: Negative.  Negative for depression, hallucinations and suicidal ideas.    Tolerating Diet:Nothing by mouth      DRUG ALLERGIES:   Allergies  Allergen Reactions  . Axid [Nizatidine]   . Codeine Itching    VITALS:  Blood pressure 119/61, pulse 63, temperature 97.6 F (36.4 C), resp. rate 20, height 5\' 4"  (1.626 m), weight 97.5 kg (214 lb 14.4 oz), SpO2 91 %.  PHYSICAL EXAMINATION:   Physical Exam  Constitutional: He is oriented to person, place, and time and well-developed, well-nourished, and in no distress. No distress.  HENT:  Head: Normocephalic.  Eyes: No scleral icterus.  Neck: Normal range of motion. Neck supple. No JVD present. No tracheal deviation present.  Cardiovascular: Normal rate, regular rhythm and normal heart sounds.  Exam reveals no gallop and no friction rub.    No murmur heard. Pulmonary/Chest: Effort normal and breath sounds normal. No respiratory distress. He has no wheezes. He has no rales. He exhibits no tenderness.  Abdominal: Soft. Bowel sounds are normal. He exhibits no distension and no mass. There is no tenderness. There is no rebound and no guarding.  Musculoskeletal: Normal range of motion. He exhibits no edema.  Neurological: He is alert and oriented to person, place, and time.  Skin: Skin is warm. No rash noted. There is erythema (around dressing tape).  Psychiatric: Affect and judgment normal.      LABORATORY PANEL:   CBC  Recent Labs Lab 09/11/15 0533  WBC 6.5  HGB 14.3  HCT 41.4  PLT 193   ------------------------------------------------------------------------------------------------------------------  Chemistries   Recent Labs Lab 09/11/15 0533  NA 140  K 4.5  CL 105  CO2 28  GLUCOSE 102*  BUN 22*  CREATININE 1.36*  CALCIUM 8.9  AST 27  ALT 29  ALKPHOS 68  BILITOT 0.8   ------------------------------------------------------------------------------------------------------------------  Cardiac Enzymes  Recent Labs Lab 09/10/15 1637  TROPONINI <0.03   ------------------------------------------------------------------------------------------------------------------  RADIOLOGY:  Dg Chest Portable 1 View  Result Date: 09/10/2015 CLINICAL DATA:  65 year old male with defibrillator malfunction. EXAM: PORTABLE CHEST 1 VIEW COMPARISON:  08/29/2015 and prior chest radiographs FINDINGS: Heart size is upper limits of normal. Left dual chamber ICD is unchanged without wire fracture. The leads overlie the regions of the right atrium right ventricle. Mild right basilar scarring is again noted. There is no evidence of pleural effusion or pneumothorax. IMPRESSION: Unchanged appearance of left ICD without definite complicating features. No definite evidence of lead fracture. Electronically Signed  By: Margarette Canada  M.D.   On: 09/10/2015 16:31    ASSESSMENT AND PLAN:    65 year old male with history of dilated cardiomyopathy status post ICD who presents with ICD malfunction.   1. ICD malfunction with a faulty RV coil lead: Plan for RV coil lead revision by cardiology.  2. Ischemic cardio myopathy: Continue Coreg, Aldactone aspirin, lisinopril and statin.  3. Essential hypertension: Continue verapamil, Aldactone, lisinopril and Coreg. Blood pressure acceptable.  4. Diabetes: Continue sliding scale insulin and glipizide. Monitor blood sugars closely   Management plans discussed with the patient and he is in agreement.  CODE STATUS: full  TOTAL TIME TAKING CARE OF THIS PATIENT: 35 minutes.     POSSIBLE D/C today if revision is performed today and there are no complications, DEPENDING ON CLINICAL CONDITION.   Brinklee Cisse M.D on 09/11/2015 at 10:34 AM  Between 7am to 6pm - Pager - [phone] After 6pm go to [email or web address] - password [email or web address] [facility]  Office  [phone]  CC: Primary care physician; [facility]  Note: This dictation was prepared with Dragon dictation along with smaller phrase technology. Any transcriptional errors that result from this process are unintentional.

## 2015-09-12 LAB — GLUCOSE, CAPILLARY
GLUCOSE-CAPILLARY: 68 mg/dL (ref 65–99)
GLUCOSE-CAPILLARY: 143 mg/dL — AB (ref 65–99)
Glucose-Capillary: 110 mg/dL — ABNORMAL HIGH (ref 65–99)
Glucose-Capillary: 118 mg/dL — ABNORMAL HIGH (ref 65–99)

## 2015-09-12 MED ORDER — CEFAZOLIN SODIUM-DEXTROSE 2-4 GM/100ML-% IV SOLN
2.0000 g | INTRAVENOUS | Status: AC
  Administered 2015-09-13: 2 g via INTRAVENOUS
  Filled 2015-09-12: qty 100

## 2015-09-12 MED ORDER — SODIUM CHLORIDE 0.9 % IR SOLN
80.0000 mg | Status: DC
  Filled 2015-09-12: qty 2

## 2015-09-12 NOTE — Progress Notes (Signed)
[facility]  SUBJECTIVE: I don't have chest pain   Vitals:   09/11/15 1926 09/11/15 1928 09/11/15 2218 09/12/15 0424  BP: 97/64 (!) 104/93 96/63 103/62  Pulse: 72 81 65 74  Resp: 20   (!) 21  Temp: 97.3 F (36.3 C)   97.6 F (36.4 C)  TempSrc: Oral   Oral  SpO2: 94% 97% 92% 90%  Weight:    98 kg (216 lb 1.6 oz)  Height:         Intake/Output Summary (Last 24 hours) at 09/12/15 0750 Last data filed at 09/12/15 0743  Gross per 24 hour  Intake                3 ml  Output              200 ml  Net             -197 ml      PHYSICAL EXAM  General: Well developed, well nourished, in no acute distress HEENT:  Normocephalic and atramatic Neck:  No JVD.  Lungs: Clear bilaterally to auscultation and percussion. Heart: HRRR . Normal S1 and S2 without gallops or murmurs.  Abdomen: Bowel sounds are positive, abdomen soft and non-tender  Msk:  Back normal, normal gait. Normal strength and tone for age. Extremities: No clubbing, cyanosis or edema.   Neuro: Alert and oriented X 3. Psych:  Good affect, responds appropriately   LABS: Basic Metabolic Panel:  Recent Labs  09/10/15 1637 09/11/15 0533  NA 136 140  K 4.6 4.5  CL 104 105  CO2 25 28  GLUCOSE 91 102*  BUN 23* 22*  CREATININE 1.44* 1.36*  CALCIUM 9.0 8.9   Liver Function Tests:  Recent Labs  09/11/15 0533  AST 27  ALT 29  ALKPHOS 68  BILITOT 0.8  PROT 7.0  ALBUMIN 3.8   No results for input(s): LIPASE, AMYLASE in the last 72 hours. CBC:  Recent Labs  09/10/15 1637 09/11/15 0533  WBC 8.3 6.5  NEUTROABS 4.9  --   HGB 14.3 14.3  HCT 41.4 41.4  MCV 90.4 92.1  PLT 204 193   Cardiac Enzymes:  Recent Labs  09/10/15 1637  TROPONINI <0.03   BNP: Invalid input(s): POCBNP D-Dimer: No results for input(s): DDIMER in the last 72 hours. Hemoglobin A1C: No results for input(s): HGBA1C in the last 72 hours. Fasting Lipid Panel: No results for input(s): CHOL, HDL, LDLCALC, TRIG, CHOLHDL,  LDLDIRECT in the last 72 hours. Thyroid Function Tests: No results for input(s): TSH, T4TOTAL, T3FREE, THYROIDAB in the last 72 hours.  Invalid input(s): FREET3 Anemia Panel: No results for input(s): VITAMINB12, FOLATE, FERRITIN, TIBC, IRON, RETICCTPCT in the last 72 hours.  Dg Chest Portable 1 View  Result Date: 09/10/2015 CLINICAL DATA:  65 year old male with defibrillator malfunction. EXAM: PORTABLE CHEST 1 VIEW COMPARISON:  08/29/2015 and prior chest radiographs FINDINGS: Heart size is upper limits of normal. Left dual chamber ICD is unchanged without wire fracture. The leads overlie the regions of the right atrium right ventricle. Mild right basilar scarring is again noted. There is no evidence of pleural effusion or pneumothorax. IMPRESSION: Unchanged appearance of left ICD without definite complicating features. No definite evidence of lead fracture. Electronically Signed   By: Margarette Canada M.D.   On: 09/10/2015 16:31    Echo   TELEMETRY: Normal sinus rhythm:  ASSESSMENT AND PLAN:  Principal Problem:   ICD (implantable cardioverter-defibrillator) lead failure Active Problems:   Dilated  cardiomyopathy ([place])   Diabetes mellitus without complication (HCC)   HTN, goal below 140/80    1. ICD malfunction, faulty RV coil lead, likely loose set screw 2. CAD, no chest pain 3. Ischemic cardiomyopathy 4. Chronic systolic congestive heart failure, stable 5. Type 2 diabetes  Recommendations  1. Continue current medications 2. Revise RV: coil lead, scheduled for Wednesday 09/13/2015 due to delays in the OR   Katiana Ruland, MD, PhD, [facility] 09/12/2015 7:50 AM

## 2015-09-12 NOTE — Progress Notes (Signed)
[place] at [place] NAME: Lance Nunez    MR#:  [id number]  DATE OF BIRTH:  [birth year]  SUBJECTIVE:   Survived yesterday's heat. No acute events on telemetry.   REVIEW OF SYSTEMS:    Review of Systems  Constitutional: Negative.  Negative for chills, fever and malaise/fatigue.  HENT: Negative.  Negative for ear discharge, ear pain, hearing loss, nosebleeds and sore throat.   Eyes: Negative.  Negative for blurred vision and pain.  Respiratory: Negative.  Negative for cough, hemoptysis, shortness of breath and wheezing.   Cardiovascular: Negative.  Negative for chest pain, palpitations and leg swelling.  Gastrointestinal: Negative.  Negative for abdominal pain, blood in stool, diarrhea, nausea and vomiting.  Genitourinary: Negative.  Negative for dysuria.  Musculoskeletal: Negative.  Negative for back pain.  Skin: Negative.   Neurological: Negative for dizziness, tremors, speech change, focal weakness, seizures and headaches.  Endo/Heme/Allergies: Negative.  Does not bruise/bleed easily.  Psychiatric/Behavioral: Negative.  Negative for depression, hallucinations and suicidal ideas.    Tolerating Diet Yes      DRUG ALLERGIES:   Allergies  Allergen Reactions  . Axid [Nizatidine]   . Codeine Itching    VITALS:  Blood pressure 129/71, pulse 81, temperature 98.4 F (36.9 C), temperature source Oral, resp. rate 18, height 5\' 4"  (1.626 m), weight 98 kg (216 lb 1.6 oz), SpO2 96 %.  PHYSICAL EXAMINATION:   Physical Exam  Constitutional: He is oriented to person, place, and time and well-developed, well-nourished, and in no distress. No distress.  HENT:  Head: Normocephalic.  Eyes: No scleral icterus.  Neck: Normal range of motion. Neck supple. No JVD present. No tracheal deviation present.  Cardiovascular: Normal rate, regular rhythm and normal heart sounds.  Exam reveals no gallop and no friction rub.   No murmur  heard. Pulmonary/Chest: Effort normal and breath sounds normal. No respiratory distress. He has no wheezes. He has no rales. He exhibits no tenderness.  Abdominal: Soft. Bowel sounds are normal. He exhibits no distension and no mass. There is no tenderness. There is no rebound and no guarding.  Musculoskeletal: Normal range of motion. He exhibits no edema.  Neurological: He is alert and oriented to person, place, and time.  Skin: Skin is warm. No rash noted. There is erythema (around dressing tape).  Psychiatric: Affect and judgment normal.      LABORATORY PANEL:   CBC  Recent Labs Lab 09/11/15 0533  WBC 6.5  HGB 14.3  HCT 41.4  PLT 193   ------------------------------------------------------------------------------------------------------------------  Chemistries   Recent Labs Lab 09/11/15 0533  NA 140  K 4.5  CL 105  CO2 28  GLUCOSE 102*  BUN 22*  CREATININE 1.36*  CALCIUM 8.9  AST 27  ALT 29  ALKPHOS 68  BILITOT 0.8   ------------------------------------------------------------------------------------------------------------------  Cardiac Enzymes  Recent Labs Lab 09/10/15 1637  TROPONINI <0.03   ------------------------------------------------------------------------------------------------------------------  RADIOLOGY:  Dg Chest Portable 1 View  Result Date: 09/10/2015 CLINICAL DATA:  65 year old male with defibrillator malfunction. EXAM: PORTABLE CHEST 1 VIEW COMPARISON:  08/29/2015 and prior chest radiographs FINDINGS: Heart size is upper limits of normal. Left dual chamber ICD is unchanged without wire fracture. The leads overlie the regions of the right atrium right ventricle. Mild right basilar scarring is again noted. There is no evidence of pleural effusion or pneumothorax. IMPRESSION: Unchanged appearance of left ICD without definite complicating features. No definite evidence of lead fracture. Electronically Signed   By: Dellis Filbert  Hu M.D.   On:  09/10/2015 16:31    ASSESSMENT AND PLAN:    65 year old male with history of dilated cardiomyopathy status post ICD who presents with ICD malfunction.   1. ICD malfunction with a faulty RV coil lead: Plan for RV coil lead revision by cardiology tomorrow at 1:30 due to delays in the operating room..  2. Ischemic cardiomyopathy: Continue Coreg, Aldactone aspirin, lisinopril and statin.  3. Essential hypertension: Continue verapamil, Aldactone, lisinopril and Coreg.  4. Diabetes: Continue sliding scale insulin and glipizide. Monitor blood sugars closely   Management plans discussed with the patient and he is in agreement.  CODE STATUS: full  TOTAL TIME TAKING CARE OF THIS PATIENT: 24 minutes.     POSSIBLE D/C tomorrow  Dontavis Tschantz M.D on 09/12/2015 at 10:10 AM  Between 7am to 6pm - Pager - [phone] After 6pm go to [email or web address] - password [email or web address] [facility]  Office  [phone]  CC: Primary care physician; [facility]  Note: This dictation was prepared with Dragon dictation along with smaller phrase technology. Any transcriptional errors that result from this process are unintentional.

## 2015-09-12 NOTE — Progress Notes (Signed)
RN spoke to [employer] on telephone. Informed that the device is turned off. I will continue to assess.

## 2015-09-13 ENCOUNTER — Encounter
Admission: EM | Disposition: A | Discharge status: Home / Self Care | Payer: Self-pay | Source: Home / Self Care | Attending: Emergency Medicine

## 2015-09-13 ENCOUNTER — Encounter: Payer: Self-pay | Admitting: *Deleted

## 2015-09-13 ENCOUNTER — Observation Stay: Payer: Medicare HMO | Admitting: Certified Registered"

## 2015-09-13 HISTORY — PX: IMPLANTABLE CARDIOVERTER DEFIBRILLATOR (ICD) GENERATOR CHANGE: SHX5469

## 2015-09-13 LAB — BASIC METABOLIC PANEL
Anion gap: 7 (ref 5–15)
BUN: 35 mg/dL — AB (ref 6–20)
CHLORIDE: 100 mmol/L — AB (ref 101–111)
CO2: 28 mmol/L (ref 22–32)
CREATININE: 1.56 mg/dL — AB (ref 0.61–1.24)
Calcium: 8.4 mg/dL — ABNORMAL LOW (ref 8.9–10.3)
GFR, EST AFRICAN AMERICAN: 52 mL/min — AB (ref >60)
GFR, EST NON AFRICAN AMERICAN: 45 mL/min — AB (ref >60)
Glucose, Bld: 190 mg/dL — ABNORMAL HIGH (ref 65–99)
POTASSIUM: 4.3 mmol/L (ref 3.5–5.1)
SODIUM: 135 mmol/L (ref 135–145)

## 2015-09-13 LAB — GLUCOSE, CAPILLARY
GLUCOSE-CAPILLARY: 170 mg/dL — AB (ref 65–99)
GLUCOSE-CAPILLARY: 123 mg/dL — AB (ref 65–99)
GLUCOSE-CAPILLARY: 93 mg/dL (ref 65–99)
Glucose-Capillary: 107 mg/dL — ABNORMAL HIGH (ref 65–99)
Glucose-Capillary: 104 mg/dL — ABNORMAL HIGH (ref 65–99)

## 2015-09-13 SURGERY — ICD GENERATOR CHANGE
Anesthesia: General | Site: Chest | Laterality: Left | Wound class: Clean

## 2015-09-13 MED ORDER — ACETAMINOPHEN 650 MG RE SUPP: 650.0000 mg | Freq: Four times a day (QID) | RECTAL | Status: DC | PRN

## 2015-09-13 MED ORDER — FENTANYL CITRATE (PF) 100 MCG/2ML IJ SOLN
INTRAMUSCULAR | Status: DC | PRN
  Administered 2015-09-13: 50 ug via INTRAVENOUS

## 2015-09-13 MED ORDER — MIDAZOLAM HCL 2 MG/2ML IJ SOLN
INTRAMUSCULAR | Status: DC | PRN
  Administered 2015-09-13: 1 mg via INTRAVENOUS

## 2015-09-13 MED ORDER — FENTANYL CITRATE (PF) 100 MCG/2ML IJ SOLN: 25.0000 ug | INTRAMUSCULAR | Status: DC | PRN

## 2015-09-13 MED ORDER — LIDOCAINE 1 % OPTIME INJ - NO CHARGE
INTRAMUSCULAR | Status: DC | PRN
  Administered 2015-09-13: 18 mL

## 2015-09-13 MED ORDER — LIDOCAINE HCL (CARDIAC) 20 MG/ML IV SOLN
INTRAVENOUS | Status: DC | PRN
  Administered 2015-09-13: 60 mg via INTRAVENOUS

## 2015-09-13 MED ORDER — PROPOFOL 500 MG/50ML IV EMUL
INTRAVENOUS | Status: DC | PRN
  Administered 2015-09-13: 100 ug/kg/min via INTRAVENOUS

## 2015-09-13 MED ORDER — ONDANSETRON HCL 4 MG/2ML IJ SOLN: 4.0000 mg | Freq: Four times a day (QID) | INTRAMUSCULAR | Status: DC | PRN

## 2015-09-13 MED ORDER — ONDANSETRON HCL 4 MG/2ML IJ SOLN: 4.0000 mg | Freq: Once | INTRAMUSCULAR | Status: DC | PRN

## 2015-09-13 MED ORDER — ACETAMINOPHEN 325 MG PO TABS: 650.0000 mg | ORAL_TABLET | Freq: Four times a day (QID) | ORAL | Status: DC | PRN

## 2015-09-13 MED ORDER — ACETAMINOPHEN 325 MG PO TABS: 325.0000 mg | ORAL_TABLET | ORAL | Status: DC | PRN

## 2015-09-13 SURGICAL SUPPLY — 47 items
BAG DECANTER FOR FLEXI CONT (MISCELLANEOUS) ×3 IMPLANT
BLADE SURG SZ10 CARB STEEL (BLADE) ×3 IMPLANT
CANISTER SUCT 1200ML W/VALVE (MISCELLANEOUS) ×3 IMPLANT
CHLORAPREP W/TINT 26ML (MISCELLANEOUS) ×3 IMPLANT
CLOSURE WOUND 1/2 X4 (GAUZE/BANDAGES/DRESSINGS) ×1
COVER LIGHT HANDLE STERIS (MISCELLANEOUS) ×6 IMPLANT
DEVICE DISSECT PLASMABLAD 3.0S (MISCELLANEOUS) ×1 IMPLANT
DRAPE C-ARM XRAY 36X54 (DRAPES) ×3 IMPLANT
DRAPE INCISE IOBAN 66X45 STRL (DRAPES) ×3 IMPLANT
DRAPE LAPAROTOMY 77X122 PED (DRAPES) ×3 IMPLANT
DRSG TEGADERM 4X4.75 (GAUZE/BANDAGES/DRESSINGS) ×3 IMPLANT
DRSG TEGADERM 6X8 (GAUZE/BANDAGES/DRESSINGS) ×3 IMPLANT
ELECT REM PT RETURN 9FT ADLT (ELECTROSURGICAL) ×3
ELECTRODE REM PT RTRN 9FT ADLT (ELECTROSURGICAL) ×1 IMPLANT
GLOVE BIO SURGEON STRL SZ8 (GLOVE) ×3 IMPLANT
GOWN STRL REUS W/ TWL LRG LVL3 (GOWN DISPOSABLE) ×1 IMPLANT
GOWN STRL REUS W/ TWL XL LVL3 (GOWN DISPOSABLE) ×1 IMPLANT
GOWN STRL REUS W/TWL LRG LVL3 (GOWN DISPOSABLE) ×3
GOWN STRL REUS W/TWL XL LVL3 (GOWN DISPOSABLE) ×3
GRADUATE 1200CC STRL 31836 (MISCELLANEOUS) ×3 IMPLANT
IV NS 1000ML (IV SOLUTION) ×3
IV NS 1000ML BAXH (IV SOLUTION) ×1 IMPLANT
KIT RM TURNOVER STRD PROC AR (KITS) ×3 IMPLANT
KIT WRENCH (KITS) ×2 IMPLANT
NDL FILTER BLUNT 18X1 1/2 (NEEDLE) ×1 IMPLANT
NDL HYPO 25X1 1.5 SAFETY (NEEDLE) ×1 IMPLANT
NDL SPNL 22GX3.5 QUINCKE BK (NEEDLE) ×1 IMPLANT
NEEDLE FILTER BLUNT 18X 1/2SAF (NEEDLE) ×2
NEEDLE FILTER BLUNT 18X1 1/2 (NEEDLE) ×1 IMPLANT
NEEDLE HYPO 25X1 1.5 SAFETY (NEEDLE) ×3 IMPLANT
NEEDLE SPNL 22GX3.5 QUINCKE BK (NEEDLE) ×3 IMPLANT
NS IRRIG 500ML POUR BTL (IV SOLUTION) ×3 IMPLANT
PACK BASIN MINOR ARMC (MISCELLANEOUS) ×3 IMPLANT
PACK PACE INSERTION (MISCELLANEOUS) ×3 IMPLANT
PAD STATPAD (MISCELLANEOUS) ×3 IMPLANT
PLASMABLADE 3.0S (MISCELLANEOUS) ×3
STRAP SAFETY BODY (MISCELLANEOUS) ×3 IMPLANT
STRIP CLOSURE SKIN 1/2X4 (GAUZE/BANDAGES/DRESSINGS) ×2 IMPLANT
SUT SILK 2 0 SH (SUTURE) ×3 IMPLANT
SUT VIC AB 2-0 CT1 27 (SUTURE) ×3
SUT VIC AB 2-0 CT1 TAPERPNT 27 (SUTURE) ×1 IMPLANT
SUT VIC AB 2-0 CT2 27 (SUTURE) ×3 IMPLANT
SUT VIC AB 3-0 PS2 18 (SUTURE) ×3 IMPLANT
SUT VIC AB 4-0 PS2 18 (SUTURE) ×3 IMPLANT
SYR BULB IRRIG 60ML STRL (SYRINGE) ×3 IMPLANT
SYR CONTROL 10ML (SYRINGE) ×3 IMPLANT
SYRINGE 10CC LL (SYRINGE) ×3 IMPLANT

## 2015-09-13 NOTE — Progress Notes (Signed)
[place] at [place] NAME: Lance Nunez    MR#:  [id number]  DATE OF BIRTH:  21-Aug-1950  SUBJECTIVE:  Nothing New to report this morning. Patient is awaiting procedure today.  REVIEW OF SYSTEMS:    Review of Systems  Constitutional: Negative.  Negative for chills, fever and malaise/fatigue.  HENT: Negative.  Negative for ear discharge, ear pain, hearing loss, nosebleeds and sore throat.   Eyes: Negative.  Negative for blurred vision and pain.  Respiratory: Negative.  Negative for cough, hemoptysis, shortness of breath and wheezing.   Cardiovascular: Negative.  Negative for chest pain, palpitations and leg swelling.  Gastrointestinal: Negative.  Negative for abdominal pain, blood in stool, diarrhea, nausea and vomiting.  Genitourinary: Negative.  Negative for dysuria.  Musculoskeletal: Negative.  Negative for back pain.  Skin: Negative.   Neurological: Negative for dizziness, tremors, speech change, focal weakness, seizures and headaches.  Endo/Heme/Allergies: Negative.  Does not bruise/bleed easily.  Psychiatric/Behavioral: Negative.  Negative for depression, hallucinations and suicidal ideas.    Tolerating Diet Nothing by mouth      DRUG ALLERGIES:   Allergies  Allergen Reactions  . Axid [Nizatidine]   . Codeine Itching    VITALS:  Blood pressure 112/67, pulse (!) 59, temperature 98.5 F (36.9 C), temperature source Oral, resp. rate 16, height 5\' 4"  (1.626 m), weight 98.3 kg (216 lb 11.2 oz), SpO2 92 %.  PHYSICAL EXAMINATION:   Physical Exam  Constitutional: He is oriented to person, place, and time and well-developed, well-nourished, and in no distress. No distress.  HENT:  Head: Normocephalic.  Eyes: No scleral icterus.  Neck: Normal range of motion. Neck supple. No JVD present. No tracheal deviation present.  Cardiovascular: Normal rate, regular rhythm and normal heart sounds.  Exam reveals no gallop and no friction  rub.   No murmur heard. Pulmonary/Chest: Effort normal and breath sounds normal. No respiratory distress. He has no wheezes. He has no rales. He exhibits no tenderness.  Abdominal: Soft. Bowel sounds are normal. He exhibits no distension and no mass. There is no tenderness. There is no rebound and no guarding.  Musculoskeletal: Normal range of motion. He exhibits no edema.  Neurological: He is alert and oriented to person, place, and time.  Skin: Skin is warm. No rash noted. There is erythema (around dressing tape).  Psychiatric: Affect and judgment normal.      LABORATORY PANEL:   CBC  Recent Labs Lab 09/11/15 0533  WBC 6.5  HGB 14.3  HCT 41.4  PLT 193   ------------------------------------------------------------------------------------------------------------------  Chemistries   Recent Labs Lab 09/11/15 0533 09/13/15 0357  NA 140 135  K 4.5 4.3  CL 105 100*  CO2 28 28  GLUCOSE 102* 190*  BUN 22* 35*  CREATININE 1.36* 1.56*  CALCIUM 8.9 8.4*  AST 27  --   ALT 29  --   ALKPHOS 68  --   BILITOT 0.8  --    ------------------------------------------------------------------------------------------------------------------  Cardiac Enzymes  Recent Labs Lab 09/10/15 1637  TROPONINI <0.03   ------------------------------------------------------------------------------------------------------------------  RADIOLOGY:  No results found.   ASSESSMENT AND PLAN:    65 year old male with history of dilated cardiomyopathy status post ICD who presents with ICD malfunction.   1. ICD malfunction with a faulty RV coil lead: Plan for RV coil lead revision by cardiology Today.  2. Ischemic cardiomyopathy: Continue Coreg, Aldactone aspirin, lisinopril and statin.  3. Essential hypertension: Continue verapamil, Aldactone, lisinopril and Coreg.  4. Diabetes:  Continue sliding scale insulin andhold  glipizide For procedure. Monitor blood sugars closely   Management  plans discussed with the patient and he is in agreement.  CODE STATUS: full  TOTAL TIME TAKING CARE OF THIS PATIENT: 22 minutes.     POSSIBLE D/C today if procedure goes well.  Marten Iles M.D on 09/13/2015 at 8:54 AM  Between 7am to 6pm - Pager - [phone] After 6pm go to [email or web address] - password [email or web address] [facility]  Office  [phone]  CC: Primary care physician; [facility]  Note: This dictation was prepared with Dragon dictation along with smaller phrase technology. Any transcriptional errors that result from this process are unintentional.

## 2015-09-13 NOTE — Transfer of Care (Signed)
Immediate Anesthesia Transfer of Care Note  Patient: Lance Nunez  Procedure(s) Performed: Procedure(s): ICD GENERATOR CHANGE/REVISION (Left)  Patient Location: PACU  Anesthesia Type:MAC  Level of Consciousness: awake and patient cooperative  Airway & Oxygen Therapy: Patient Spontanous Breathing  Post-op Assessment: Report given to RN and Post -op Vital signs reviewed and stable  Post vital signs: Reviewed and stable  Last Vitals:  Vitals:   09/13/15 1250 09/13/15 1432  BP: 107/88 119/73  Pulse: (!) 58 63  Resp: 20 10  Temp: 36.7 C 36.3 C    Last Pain:  Vitals:   09/13/15 1250  TempSrc: Oral  PainSc: 0-No pain         Complications: No apparent anesthesia complications

## 2015-09-13 NOTE — Anesthesia Preprocedure Evaluation (Signed)
Anesthesia Evaluation  Patient identified by MRN, date of birth, ID band Patient awake    Reviewed: Allergy & Precautions, H&P , NPO status , Patient's Chart, lab work & pertinent test results, reviewed documented beta blocker date and time   Airway Mallampati: II  TM Distance: >3 FB Neck ROM: full    Dental no notable dental hx. (+) Poor Dentition   Pulmonary neg pulmonary ROS, COPD,  COPD inhaler, former smoker,    Pulmonary exam normal breath sounds clear to auscultation       Cardiovascular Exercise Tolerance: Good hypertension, + CAD and + Past MI  negative cardio ROS Normal cardiovascular exam Rhythm:regular Rate:Normal     Neuro/Psych negative neurological ROS  negative psych ROS   GI/Hepatic negative GI ROS, Neg liver ROS,   Endo/Other  negative endocrine ROSdiabetes  Renal/GU negative Renal ROS  negative genitourinary   Musculoskeletal   Abdominal   Peds  Hematology negative hematology ROS (+)   Anesthesia Other Findings Past Medical History: No date: COPD (chronic obstructive pulmonary disease) (* No date: Coronary artery disease No date: Diabetes mellitus without complication (HCC) No date: Hypertension No date: Myocardial infarction [facility])     Comment: 46' 97' 98' Past Surgical History: No date: Fractured leg Right 09/06/2015: IMPLANTABLE CARDIOVERTER DEFIBRILLATOR (ICD) G* N/A     Comment: Procedure: ICD GENERATOR CHANGE;  Surgeon:               Isaias Cowman, MD;  Location: [facility] ORS;                Service: Cardiovascular;  Laterality: N/A; No date: Stents N/A     Comment: 4 cardiac stents BMI    Body Mass Index:  37.08 kg/m     Reproductive/Obstetrics negative OB ROS                             Anesthesia Physical Anesthesia Plan  ASA: III  Anesthesia Plan: General and MAC   Post-op Pain Management:    Induction:   Airway Management Planned:    Additional Equipment:   Intra-op Plan:   Post-operative Plan:   Informed Consent: I have reviewed the patients History and Physical, chart, labs and discussed the procedure including the risks, benefits and alternatives for the proposed anesthesia with the patient or authorized representative who has indicated his/her understanding and acceptance.   Dental Advisory Given  Plan Discussed with: CRNA  Anesthesia Plan Comments:         Anesthesia Quick Evaluation

## 2015-09-13 NOTE — Progress Notes (Signed)
[facility] Cardiology  SUBJECTIVE: I don't have chest pain   Vitals:   09/13/15 0504 09/13/15 0810 09/13/15 1153 09/13/15 1250  BP: 112/67  107/79 107/88  Pulse: (!) 59  (!) 56 (!) 58  Resp: 16  (!) 21 20  Temp: 98.5 F (36.9 C)  97.6 F (36.4 C) 98 F (36.7 C)  TempSrc: Oral  Oral Oral  SpO2: 92% 93% 97% 98%  Weight: 98.3 kg (216 lb 11.2 oz)   98 kg (216 lb)  Height:    5\' 4"  (1.626 m)     Intake/Output Summary (Last 24 hours) at 09/13/15 1430 Last data filed at 09/13/15 1420  Gross per 24 hour  Intake              560 ml  Output             1405 ml  Net             -845 ml      PHYSICAL EXAM  General: Well developed, well nourished, in no acute distress HEENT:  Normocephalic and atramatic Neck:  No JVD.  Lungs: Clear bilaterally to auscultation and percussion. Heart: HRRR . Normal S1 and S2 without gallops or murmurs.  Abdomen: Bowel sounds are positive, abdomen soft and non-tender  Msk:  Back normal, normal gait. Normal strength and tone for age. Extremities: No clubbing, cyanosis or edema.   Neuro: Alert and oriented X 3. Psych:  Good affect, responds appropriately   LABS: Basic Metabolic Panel:  Recent Labs  09/11/15 0533 09/13/15 0357  NA 140 135  K 4.5 4.3  CL 105 100*  CO2 28 28  GLUCOSE 102* 190*  BUN 22* 35*  CREATININE 1.36* 1.56*  CALCIUM 8.9 8.4*   Liver Function Tests:  Recent Labs  09/11/15 0533  AST 27  ALT 29  ALKPHOS 68  BILITOT 0.8  PROT 7.0  ALBUMIN 3.8   No results for input(s): LIPASE, AMYLASE in the last 72 hours. CBC:  Recent Labs  09/10/15 1637 09/11/15 0533  WBC 8.3 6.5  NEUTROABS 4.9  --   HGB 14.3 14.3  HCT 41.4 41.4  MCV 90.4 92.1  PLT 204 193   Cardiac Enzymes:  Recent Labs  09/10/15 1637  TROPONINI <0.03   BNP: Invalid input(s): POCBNP D-Dimer: No results for input(s): DDIMER in the last 72 hours. Hemoglobin A1C: No results for input(s): HGBA1C in the last 72 hours. Fasting Lipid Panel: No  results for input(s): CHOL, HDL, LDLCALC, TRIG, CHOLHDL, LDLDIRECT in the last 72 hours. Thyroid Function Tests: No results for input(s): TSH, T4TOTAL, T3FREE, THYROIDAB in the last 72 hours.  Invalid input(s): FREET3 Anemia Panel: No results for input(s): VITAMINB12, FOLATE, FERRITIN, TIBC, IRON, RETICCTPCT in the last 72 hours.  No results found.   Echo   TELEMETRY: Normal sinus rhythm:  ASSESSMENT AND PLAN:  Principal Problem:   ICD (implantable cardioverter-defibrillator) lead failure Active Problems:   Dilated cardiomyopathy (HCC)   Diabetes mellitus without complication (HCC)   HTN, goal below 140/80    1. Status post revision of RV coil lead with normal lead impedance  Recommendations  1. May discharge home 2. Discharge home on Keflex 250 mg 4 times a day for 7 days 3. Follow-up with Dr. Nehemiah Massed in one week   Lance Cowman, MD, PhD, [facility] 09/13/2015 2:30 PM

## 2015-09-13 NOTE — Progress Notes (Signed)
Patient discharged via wheelchair and private vehicle. IV removed and catheter intact. All discharge instructions given and patient verbalizes understanding. Tele removed and returned. No prescriptions given to patient No distress noted.   

## 2015-09-13 NOTE — Op Note (Signed)
[facility] Cardiology   09/10/2015 - 09/13/2015                     2:27 PM  PATIENT:  [name]    PRE-OPERATIVE DIAGNOSIS:  revision  POST-OPERATIVE DIAGNOSIS:  Same  PROCEDURE:  ICD GENERATOR CHANGE/REVISION  SURGEON:  Akshat Minehart, MD    ANESTHESIA:     PREOPERATIVE INDICATIONS:  Lance Nunez is a  65 y.o. male with a diagnosis of revision who failed conservative measures and elected for surgical management.    The risks benefits and alternatives were discussed with the patient preoperatively including but not limited to the risks of infection, bleeding, cardiopulmonary complications, the need for revision surgery, among others, and the patient was willing to proceed.   OPERATIVE PROCEDURE: The patient was brought to the operating room the fasting state. The left pectoral region was prepped and draped in usual sterile manner. The existing ICD generator was retrieved by blunt dissection. The RV the lead was apparently loose, and disconnected, coated with oil and repositioned into the ICD. Multiple interrogations revealed normal lead impedances. The pocket was irrigated with gentamicin solution. The pocket was closed with 20 and 4-0 Vicryl, respectively.

## 2015-09-13 NOTE — Discharge Summary (Signed)
[place] [facility] [place] NAME: Lance Nunez    MR#:  [id number]  DATE OF BIRTH:  April 20, 1950  DATE OF ADMISSION:  09/10/2015 ADMITTING PHYSICIAN: Idelle Crouch, MD  DATE OF DISCHARGE: 09/13/2015  PRIMARY CARE PHYSICIAN: [facility]    ADMISSION DIAGNOSIS:  ICD (implantable cardioverter-defibrillator) malfunction, initial encounter [T82.198A]  DISCHARGE DIAGNOSIS:  Principal Problem:   ICD (implantable cardioverter-defibrillator) lead failure Active Problems:   Dilated cardiomyopathy ([place])   Diabetes mellitus without complication ([place])   HTN, goal below 140/80   SECONDARY DIAGNOSIS:   Past Medical History:  Diagnosis Date  . COPD (chronic obstructive pulmonary disease) ([place])   . Coronary artery disease   . Diabetes mellitus without complication ([place])   . Hypertension   . Myocardial infarction [facility])    95' 97' 98'    HOSPITAL COURSE:   65 year old male with history of dilated cardiomyopathy status post ICD who presents with ICD malfunction.   1. ICD malfunction with a faulty RV coil lead: RV coil lead revisied by cardiology.  2. Ischemic cardiomyopathy: Continue Coreg, Aldactone aspirin, lisinopril and statin.  3. Essential hypertension: Continue verapamil, Aldactone, lisinopril and Coreg.  4. Diabetes: Continue glipizide.   DISCHARGE CONDITIONS AND DIET:  Stable diabetic  CONSULTS OBTAINED:  Treatment Team:  Isaias Cowman, MD  DRUG ALLERGIES:   Allergies  Allergen Reactions  . Axid [Nizatidine]   . Codeine Itching    DISCHARGE MEDICATIONS:   Current Discharge Medication List    CONTINUE these medications which have NOT CHANGED   Details  aspirin 81 MG tablet Take 81 mg by mouth daily.    carvedilol (COREG) 6.25 MG tablet Take 6.25 mg by mouth 2 (two) times daily with a meal.    cephALEXin (KEFLEX) 250 MG capsule Take 1 capsule (250 mg total) by mouth 4 (four) times daily. Qty: 28  capsule, Refills: 0    gabapentin (NEURONTIN) 600 MG tablet Take 600 mg by mouth 3 (three) times daily.    gemfibrozil (LOPID) 600 MG tablet Take 600 mg by mouth 2 (two) times daily before a meal.    glipiZIDE (GLUCOTROL) 10 MG tablet Take 10 mg by mouth daily before breakfast.    lovastatin (MEVACOR) 40 MG tablet Take 80 mg by mouth at bedtime.     metFORMIN (GLUCOPHAGE) 500 MG tablet Take 500 mg by mouth daily at 6 (six) AM.    nitroGLYCERIN (NITROSTAT) 0.4 MG SL tablet Place 0.4 mg under the tongue every 5 (five) minutes as needed for chest pain.    quinapril (ACCUPRIL) 10 MG tablet Take 10 mg by mouth daily.    ranitidine (ZANTAC) 150 MG tablet Take 150 mg by mouth 2 (two) times daily as needed for heartburn.     spironolactone (ALDACTONE) 25 MG tablet Take 12.5 mg by mouth at bedtime.     sucralfate (CARAFATE) 1 g tablet Take 1 g by mouth 3 (three) times daily.    verapamil (CALAN-SR) 180 MG CR tablet Take 180 mg by mouth at bedtime.              Today   CHIEF COMPLAINT:  Doing well waiting for procedure and to go home   VITAL SIGNS:  Blood pressure 107/79, pulse (!) 56, temperature 97.6 F (36.4 C), temperature source Oral, resp. rate (!) 21, height 5\' 4"  (1.626 m), weight 98.3 kg (216 lb 11.2 oz), SpO2 97 %.   REVIEW OF SYSTEMS:  Review of Systems  Constitutional: Negative.  Negative for chills, fever and malaise/fatigue.  HENT: Negative.  Negative for ear discharge, ear pain, hearing loss, nosebleeds and sore throat.   Eyes: Negative.  Negative for blurred vision and pain.  Respiratory: Negative.  Negative for cough, hemoptysis, shortness of breath and wheezing.   Cardiovascular: Negative.  Negative for chest pain, palpitations and leg swelling.  Gastrointestinal: Negative.  Negative for abdominal pain, blood in stool, diarrhea, nausea and vomiting.  Genitourinary: Negative.  Negative for dysuria.  Musculoskeletal: Negative.  Negative for back pain.   Skin: Negative.   Neurological: Negative for dizziness, tremors, speech change, focal weakness, seizures and headaches.  Endo/Heme/Allergies: Negative.  Does not bruise/bleed easily.  Psychiatric/Behavioral: Negative.  Negative for depression, hallucinations and suicidal ideas.     PHYSICAL EXAMINATION:  GENERAL:  65 y.o.-year-old patient lying in the bed with no acute distress.  NECK:  Supple, no jugular venous distention. No thyroid enlargement, no tenderness.  LUNGS: Normal breath sounds bilaterally, no wheezing, rales,rhonchi  No use of accessory muscles of respiration.  CARDIOVASCULAR: S1, S2 normal. No murmurs, rubs, or gallops.  ABDOMEN: Soft, non-tender, non-distended. Bowel sounds present. No organomegaly or mass.  EXTREMITIES: No pedal edema, cyanosis, or clubbing.  PSYCHIATRIC: The patient is alert and oriented x 3.  SKIN: No obvious rash, lesion, or ulcer.   DATA REVIEW:   CBC  Recent Labs Lab 09/11/15 0533  WBC 6.5  HGB 14.3  HCT 41.4  PLT 193    Chemistries   Recent Labs Lab 09/11/15 0533 09/13/15 0357  NA 140 135  K 4.5 4.3  CL 105 100*  CO2 28 28  GLUCOSE 102* 190*  BUN 22* 35*  CREATININE 1.36* 1.56*  CALCIUM 8.9 8.4*  AST 27  --   ALT 29  --   ALKPHOS 68  --   BILITOT 0.8  --     Cardiac Enzymes  Recent Labs Lab 09/10/15 1637  TROPONINI <0.03    Microbiology Results  @[email or web address] @  RADIOLOGY:  No results found.    Management plans discussed with the patient and he is in agreement. Stable for discharge   Patient should follow up with 3  CODE STATUS:     Code Status Orders        Start     Ordered   09/10/15 2049  Full code  Continuous     09/10/15 2048    Code Status History    Date Active Date Inactive Code Status Order ID Comments User Context   09/06/2015  3:06 PM 09/06/2015  6:56 PM Full Code [id number]  Isaias Cowman, MD Inpatient      TOTAL TIME TAKING CARE OF THIS PATIENT: 35 minutes.    Note: This  dictation was prepared with Dragon dictation along with smaller phrase technology. Any transcriptional errors that result from this process are unintentional.  Arvine Clayburn M.D on 09/13/2015 at 12:25 PM  Between 7am to 6pm - Pager - [phone] After 6pm go to [email or web address] - password [email or web address] [facility]  Office  [phone]  CC: Primary care physician; [place]

## 2015-09-13 NOTE — Progress Notes (Signed)
Spoke with Dr. Saralyn Pilar and Dr. Benjie Karvonen who was in agreement that patient may be discharged now. Will proceed with discharge.

## 2015-09-13 NOTE — Progress Notes (Signed)
Inpatient Diabetes Program Recommendations  AACE/ADA: New Consensus Statement on Inpatient Glycemic Control (2015)  Target Ranges:  Prepandial:   less than 140 mg/dL      Peak postprandial:   less than 180 mg/dL (1-2 hours)      Critically ill patients:  140 - 180 mg/dL   Results for BALLARD, THIBODEAUX (MRN [id number]) as of 09/13/2015 08:50  Ref. Range 09/12/2015 07:20 09/12/2015 11:26 09/12/2015 16:34 09/12/2015 19:49 09/13/2015 07:33  Glucose-Capillary Latest Ref Range: 65 - 99 mg/dL 110 (H) 118 (H) 68 143 (H) 170 (H)   Review of Glycemic Control  Diabetes history: DM2 Outpatient Diabetes medications: Glipizide 10 mg QAM Current orders for Inpatient glycemic control: Glipizide 10 mg QAM, Novolog 0-9 units TID with meals  Inpatient Diabetes Program Recommendations: Oral Agents: Note patient is NPO for procedure today at 1:30. While inpatient and NPO, please consider discontinuing Glipizide. Insulin-Correction: Please consider ordering Novolog bedtime correction scale.  Thanks, Barnie Alderman, RN, MSN, CDE Diabetes Coordinator Inpatient Diabetes Program [phone] (Team Pager from [place] to [place]) [phone] ([place] office) [phone] [facility] office) [phone] [facility] office)

## 2015-09-13 NOTE — Progress Notes (Signed)
Patient back in room from PACU status post ICD implantation. Dressing clean dry and intact VSS no complaints of pain at this time. Will continue to monitor.

## 2015-09-14 ENCOUNTER — Encounter: Payer: Self-pay | Admitting: Cardiology

## 2015-09-15 NOTE — Anesthesia Postprocedure Evaluation (Signed)
Anesthesia Post Note  Patient: DEKKER SAMBORSKI  Procedure(s) Performed: Procedure(s) (LRB): ICD GENERATOR CHANGE/REVISION (Left)  Patient location during evaluation: PACU Anesthesia Type: General Level of consciousness: awake and alert Pain management: pain level controlled Vital Signs Assessment: post-procedure vital signs reviewed and stable Respiratory status: spontaneous breathing, nonlabored ventilation, respiratory function stable and patient connected to nasal cannula oxygen Cardiovascular status: blood pressure returned to baseline and stable Postop Assessment: no signs of nausea or vomiting Anesthetic complications: no    Last Vitals:  Vitals:   09/13/15 1512 09/13/15 1531  BP:  128/86  Pulse: 68 63  Resp:  16  Temp:      Last Pain:  Vitals:   09/13/15 1502  TempSrc:   PainSc: 0-No pain                 Molli Barrows

## 2016-01-15 ENCOUNTER — Observation Stay
Admission: EM | Admit: 2016-01-15 | Discharge: 2016-01-15 | Disposition: A | Discharge status: Home / Self Care | Payer: Medicare HMO | Attending: Internal Medicine | Admitting: Internal Medicine

## 2016-01-15 ENCOUNTER — Emergency Department: Payer: Medicare HMO

## 2016-01-15 DIAGNOSIS — I5022 Chronic systolic (congestive) heart failure: Secondary | ICD-10-CM | POA: Diagnosis not present

## 2016-01-15 DIAGNOSIS — I255 Ischemic cardiomyopathy: Secondary | ICD-10-CM | POA: Diagnosis not present

## 2016-01-15 DIAGNOSIS — J449 Chronic obstructive pulmonary disease, unspecified: Secondary | ICD-10-CM | POA: Diagnosis not present

## 2016-01-15 DIAGNOSIS — I499 Cardiac arrhythmia, unspecified: Secondary | ICD-10-CM | POA: Diagnosis present

## 2016-01-15 DIAGNOSIS — Z7982 Long term (current) use of aspirin: Secondary | ICD-10-CM | POA: Insufficient documentation

## 2016-01-15 DIAGNOSIS — I252 Old myocardial infarction: Secondary | ICD-10-CM | POA: Insufficient documentation

## 2016-01-15 DIAGNOSIS — I472 Ventricular tachycardia, unspecified: Secondary | ICD-10-CM

## 2016-01-15 DIAGNOSIS — I110 Hypertensive heart disease with heart failure: Secondary | ICD-10-CM | POA: Insufficient documentation

## 2016-01-15 DIAGNOSIS — Z95810 Presence of automatic (implantable) cardiac defibrillator: Secondary | ICD-10-CM | POA: Diagnosis not present

## 2016-01-15 DIAGNOSIS — E119 Type 2 diabetes mellitus without complications: Secondary | ICD-10-CM | POA: Diagnosis not present

## 2016-01-15 DIAGNOSIS — I2510 Atherosclerotic heart disease of native coronary artery without angina pectoris: Secondary | ICD-10-CM | POA: Insufficient documentation

## 2016-01-15 DIAGNOSIS — Z79899 Other long term (current) drug therapy: Secondary | ICD-10-CM | POA: Insufficient documentation

## 2016-01-15 DIAGNOSIS — Z87891 Personal history of nicotine dependence: Secondary | ICD-10-CM | POA: Diagnosis not present

## 2016-01-15 DIAGNOSIS — Z955 Presence of coronary angioplasty implant and graft: Secondary | ICD-10-CM | POA: Insufficient documentation

## 2016-01-15 DIAGNOSIS — Z23 Encounter for immunization: Secondary | ICD-10-CM | POA: Insufficient documentation

## 2016-01-15 DIAGNOSIS — Z7984 Long term (current) use of oral hypoglycemic drugs: Secondary | ICD-10-CM | POA: Insufficient documentation

## 2016-01-15 LAB — BASIC METABOLIC PANEL
ANION GAP: 8 (ref 5–15)
Anion gap: 5 (ref 5–15)
BUN: 26 mg/dL — AB (ref 6–20)
BUN: 27 mg/dL — ABNORMAL HIGH (ref 6–20)
CALCIUM: 8.9 mg/dL (ref 8.9–10.3)
CHLORIDE: 101 mmol/L (ref 101–111)
CHLORIDE: 107 mmol/L (ref 101–111)
CO2: 27 mmol/L (ref 22–32)
CO2: 28 mmol/L (ref 22–32)
Calcium: 8.8 mg/dL — ABNORMAL LOW (ref 8.9–10.3)
Creatinine, Ser: 1.53 mg/dL — ABNORMAL HIGH (ref 0.61–1.24)
Creatinine, Ser: 1.71 mg/dL — ABNORMAL HIGH (ref 0.61–1.24)
GFR calc Af Amer: 53 mL/min — ABNORMAL LOW (ref >60)
GFR calc non Af Amer: 40 mL/min — ABNORMAL LOW (ref >60)
GFR calc non Af Amer: 46 mL/min — ABNORMAL LOW (ref >60)
GFR, EST AFRICAN AMERICAN: 47 mL/min — AB (ref >60)
GLUCOSE: 189 mg/dL — AB (ref 65–99)
Glucose, Bld: 269 mg/dL — ABNORMAL HIGH (ref 65–99)
POTASSIUM: 4.5 mmol/L (ref 3.5–5.1)
Potassium: 4.1 mmol/L (ref 3.5–5.1)
Sodium: 137 mmol/L (ref 135–145)
Sodium: 139 mmol/L (ref 135–145)

## 2016-01-15 LAB — CBC
HEMATOCRIT: 37.5 % — AB (ref 40.0–52.0)
Hemoglobin: 12.6 g/dL — ABNORMAL LOW (ref 13.0–18.0)
MCH: 31.4 pg (ref 26.0–34.0)
MCHC: 33.6 g/dL (ref 32.0–36.0)
MCV: 93.5 fL (ref 80.0–100.0)
Platelets: 246 10*3/uL (ref 150–440)
RBC: 4.01 MIL/uL — ABNORMAL LOW (ref 4.40–5.90)
RDW: 14.1 % (ref 11.5–14.5)
WBC: 9.1 10*3/uL (ref 3.8–10.6)

## 2016-01-15 LAB — TROPONIN I
Troponin I: <0.03 ng/mL (ref <0.03)
Troponin I: <0.03 ng/mL (ref <0.03)

## 2016-01-15 LAB — CBC WITH DIFFERENTIAL/PLATELET
BASOS ABS: 0.1 10*3/uL (ref 0–0.1)
BASOS PCT: 1 %
Eosinophils Absolute: 0.1 10*3/uL (ref 0–0.7)
Eosinophils Relative: 2 %
HEMATOCRIT: 39.1 % — AB (ref 40.0–52.0)
HEMOGLOBIN: 13.3 g/dL (ref 13.0–18.0)
Lymphocytes Relative: 17 %
Lymphs Abs: 1.5 10*3/uL (ref 1.0–3.6)
MCH: 31.5 pg (ref 26.0–34.0)
MCHC: 33.9 g/dL (ref 32.0–36.0)
MCV: 93.1 fL (ref 80.0–100.0)
Monocytes Absolute: 0.5 10*3/uL (ref 0.2–1.0)
Monocytes Relative: 6 %
NEUTROS ABS: 6.6 10*3/uL — AB (ref 1.4–6.5)
NEUTROS PCT: 74 %
Platelets: 252 10*3/uL (ref 150–440)
RBC: 4.2 MIL/uL — ABNORMAL LOW (ref 4.40–5.90)
RDW: 14.5 % (ref 11.5–14.5)
WBC: 8.9 10*3/uL (ref 3.8–10.6)

## 2016-01-15 LAB — MAGNESIUM: Magnesium: 2 mg/dL (ref 1.7–2.4)

## 2016-01-15 MED ORDER — INFLUENZA VAC SPLIT QUAD 0.5 ML IM SUSY
0.5000 mL | PREFILLED_SYRINGE | INTRAMUSCULAR | Status: AC
Start: 2016-01-16 — End: 2016-01-15
  Administered 2016-01-15: 0.5 mL via INTRAMUSCULAR

## 2016-01-15 MED ORDER — LISINOPRIL 5 MG PO TABS
5.0000 mg | ORAL_TABLET | Freq: Every day | ORAL | Status: DC
  Administered 2016-01-15: 5 mg via ORAL
  Filled 2016-01-15: qty 1

## 2016-01-15 MED ORDER — SODIUM CHLORIDE 0.9% FLUSH: 3.0000 mL | Freq: Two times a day (BID) | INTRAVENOUS | Status: DC

## 2016-01-15 MED ORDER — SUCRALFATE 1 G PO TABS
1.0000 g | ORAL_TABLET | Freq: Three times a day (TID) | ORAL | Status: DC
  Administered 2016-01-15: 1 g via ORAL
  Filled 2016-01-15: qty 1

## 2016-01-15 MED ORDER — SENNOSIDES-DOCUSATE SODIUM 8.6-50 MG PO TABS: 1.0000 | ORAL_TABLET | Freq: Every evening | ORAL | Status: DC | PRN

## 2016-01-15 MED ORDER — GABAPENTIN 600 MG PO TABS
600.0000 mg | ORAL_TABLET | Freq: Three times a day (TID) | ORAL | Status: DC
  Administered 2016-01-15: 600 mg via ORAL
  Filled 2016-01-15: qty 1

## 2016-01-15 MED ORDER — ACETAMINOPHEN 650 MG RE SUPP: 650.0000 mg | Freq: Four times a day (QID) | RECTAL | Status: DC | PRN

## 2016-01-15 MED ORDER — ONDANSETRON HCL 4 MG PO TABS: 4.0000 mg | ORAL_TABLET | Freq: Four times a day (QID) | ORAL | Status: DC | PRN

## 2016-01-15 MED ORDER — SODIUM CHLORIDE 0.9 % IV SOLN: 250.0000 mL | INTRAVENOUS | Status: DC | PRN

## 2016-01-15 MED ORDER — GLIPIZIDE 10 MG PO TABS
10.0000 mg | ORAL_TABLET | Freq: Every day | ORAL | Status: DC
  Administered 2016-01-15: 10 mg via ORAL
  Filled 2016-01-15: qty 1

## 2016-01-15 MED ORDER — VERAPAMIL HCL ER 180 MG PO TBCR
180.0000 mg | EXTENDED_RELEASE_TABLET | Freq: Every day | ORAL | Status: DC
  Filled 2016-01-15: qty 1

## 2016-01-15 MED ORDER — FAMOTIDINE 20 MG PO TABS
10.0000 mg | ORAL_TABLET | Freq: Every day | ORAL | Status: DC
  Administered 2016-01-15: 10 mg via ORAL
  Filled 2016-01-15: qty 1

## 2016-01-15 MED ORDER — ONDANSETRON HCL 4 MG/2ML IJ SOLN: 4.0000 mg | Freq: Four times a day (QID) | INTRAMUSCULAR | Status: DC | PRN

## 2016-01-15 MED ORDER — ASPIRIN 81 MG PO CHEW
81.0000 mg | CHEWABLE_TABLET | Freq: Every day | ORAL | Status: DC
  Administered 2016-01-15: 81 mg via ORAL
  Filled 2016-01-15: qty 1

## 2016-01-15 MED ORDER — NITROGLYCERIN 0.4 MG SL SUBL: 0.4000 mg | SUBLINGUAL_TABLET | SUBLINGUAL | Status: DC | PRN

## 2016-01-15 MED ORDER — PNEUMOCOCCAL VAC POLYVALENT 25 MCG/0.5ML IJ INJ
0.5000 mL | INJECTION | INTRAMUSCULAR | Status: AC
  Administered 2016-01-15: 0.5 mL via INTRAMUSCULAR

## 2016-01-15 MED ORDER — CARVEDILOL 6.25 MG PO TABS
6.2500 mg | ORAL_TABLET | Freq: Two times a day (BID) | ORAL | Status: DC
  Administered 2016-01-15: 6.25 mg via ORAL
  Filled 2016-01-15: qty 1

## 2016-01-15 MED ORDER — AMIODARONE IV BOLUS ONLY 150 MG/100ML: 150.0000 mg | Freq: Once | INTRAVENOUS | Status: AC

## 2016-01-15 MED ORDER — ACETAMINOPHEN 325 MG PO TABS: 650.0000 mg | ORAL_TABLET | Freq: Four times a day (QID) | ORAL | Status: DC | PRN

## 2016-01-15 MED ORDER — ENOXAPARIN SODIUM 40 MG/0.4ML ~~LOC~~ SOLN: 40.0000 mg | SUBCUTANEOUS | Status: DC

## 2016-01-15 MED ORDER — ASPIRIN EC 81 MG PO TBEC: 81.0000 mg | DELAYED_RELEASE_TABLET | Freq: Every day | ORAL | Status: DC

## 2016-01-15 MED ORDER — SODIUM CHLORIDE 0.9% FLUSH: 3.0000 mL | INTRAVENOUS | Status: DC | PRN

## 2016-01-15 MED ORDER — AMIODARONE HCL 200 MG PO TABS: 200.0000 mg | ORAL_TABLET | Freq: Two times a day (BID) | ORAL | 0 refills | Status: DC

## 2016-01-15 MED ORDER — SPIRONOLACTONE 25 MG PO TABS: 12.5000 mg | ORAL_TABLET | Freq: Every day | ORAL | Status: DC

## 2016-01-15 MED ORDER — GEMFIBROZIL 600 MG PO TABS
600.0000 mg | ORAL_TABLET | Freq: Two times a day (BID) | ORAL | Status: DC
  Administered 2016-01-15: 600 mg via ORAL
  Filled 2016-01-15: qty 1

## 2016-01-15 MED ORDER — PRAVASTATIN SODIUM 10 MG PO TABS: 10.0000 mg | ORAL_TABLET | Freq: Every day | ORAL | Status: DC

## 2016-01-15 NOTE — H&P (Signed)
[name] [place] NAME: Lance Nunez    MR#:  [id number]  DATE OF BIRTH:  [birth year]  DATE OF ADMISSION:  01/15/2016  PRIMARY CARE PHYSICIAN: [place]   REQUESTING/REFERRING PHYSICIAN:   CHIEF COMPLAINT:   Chief Complaint  Patient presents with  . Irregular Heart Beat    HISTORY OF PRESENT ILLNESS: Lance Nunez  is a 65 y.o. male with a known history of Coronary artery disease, myocardial infarction, defibrillator, diabetes mellitus type 2, COPD presented to the emergency room after having funny sensation in his chest. Patient was sitting on the couch and watching football game yesterday evening when he had this funny sensation in the chest. EMS was called and he was found to be in irregular rhythm. EMS found the patient to be having a fall to 15 beats of runs of Ventolin tachycardia on monitor. He was given one 50 mg of IV amiodarone infusion and brought to the emergency room for further evaluation. No new episodes of ventricular tachycardia were captured by the EMS. Patient has a defibrillator but denies it being fired. No complaints of any chest pain, shortness of breath. No complaints of any orthopnea and paroxysmal nocturnal dyspnea. Hospitalist service was consulted for further care of the patient. Case was discussed by ER physician with [facility] cardiology on-call who recommended the patient to be monitored and his defibrillator has been programmed for her for more than 15 beats of ventricular tachycardia. Potassium and magnesium levels were normal upon evaluation in the emergency room.  PAST MEDICAL HISTORY:   Past Medical History:  Diagnosis Date  . COPD (chronic obstructive pulmonary disease) ([place])   . Coronary artery disease   . Diabetes mellitus without complication ([place])   . Hypertension   . Myocardial infarction    95' 97' 98'    PAST SURGICAL HISTORY: Past Surgical History:  Procedure  Laterality Date  . Fractured leg Right   . IMPLANTABLE CARDIOVERTER DEFIBRILLATOR (ICD) GENERATOR CHANGE N/A 09/06/2015   Procedure: ICD GENERATOR CHANGE;  Surgeon: Isaias Cowman, MD;  Location: [facility] ORS;  Service: Cardiovascular;  Laterality: N/A;  . IMPLANTABLE CARDIOVERTER DEFIBRILLATOR (ICD) GENERATOR CHANGE Left 09/13/2015   Procedure: ICD GENERATOR CHANGE/REVISION;  Surgeon: Isaias Cowman, MD;  Location: [facility] ORS;  Service: Cardiovascular;  Laterality: Left;  . Stents N/A    4 cardiac stents    SOCIAL HISTORY:  Social History  Substance Use Topics  . Smoking status: Former Smoker    Quit date: 07/19/2004  . Smokeless tobacco: Never Used  . Alcohol use No    FAMILY HISTORY: No family history on file.  DRUG ALLERGIES:  Allergies  Allergen Reactions  . Prednisone Other (See Comments)  . Axid [Nizatidine]   . Codeine Itching    REVIEW OF SYSTEMS:   CONSTITUTIONAL: No fever, fatigue or weakness.  EYES: No blurred or double vision.  EARS, NOSE, AND THROAT: No tinnitus or ear pain.  RESPIRATORY: No cough, shortness of breath, wheezing or hemoptysis.  CARDIOVASCULAR: No chest pain, orthopnea, edema.  Funny sensation in the chest GASTROINTESTINAL: No nausea, vomiting, diarrhea or abdominal pain.  GENITOURINARY: No dysuria, hematuria.  ENDOCRINE: No polyuria, nocturia,  HEMATOLOGY: No anemia, easy bruising or bleeding SKIN: No rash or lesion. MUSCULOSKELETAL: No joint pain or arthritis.   NEUROLOGIC: No tingling, numbness, weakness.  Felt dizzy PSYCHIATRY: No anxiety or depression.   MEDICATIONS AT HOME:  Prior to Admission medications   Medication Sig Start Date  End Date Taking? Authorizing Provider  aspirin 81 MG tablet Take 81 mg by mouth daily.   Yes Historical Provider, MD  carvedilol (COREG) 6.25 MG tablet Take 6.25 mg by mouth 2 (two) times daily with a meal.   Yes Historical Provider, MD  gabapentin (NEURONTIN) 600 MG tablet Take 600 mg by mouth 3  (three) times daily.   Yes Historical Provider, MD  gemfibrozil (LOPID) 600 MG tablet Take 600 mg by mouth 2 (two) times daily before a meal.   Yes Historical Provider, MD  glipiZIDE (GLUCOTROL) 10 MG tablet Take 10 mg by mouth daily before breakfast.   Yes Historical Provider, MD  lovastatin (MEVACOR) 40 MG tablet Take 80 mg by mouth at bedtime.    Yes Historical Provider, MD  metFORMIN (GLUCOPHAGE) 500 MG tablet Take 500 mg by mouth daily at 6 (six) AM.   Yes Historical Provider, MD  nitroGLYCERIN (NITROSTAT) 0.4 MG SL tablet Place 0.4 mg under the tongue every 5 (five) minutes as needed for chest pain.   Yes Historical Provider, MD  quinapril (ACCUPRIL) 10 MG tablet Take 10 mg by mouth daily.   Yes Historical Provider, MD  ranitidine (ZANTAC) 150 MG tablet Take 150 mg by mouth 2 (two) times daily as needed for heartburn.    Yes Historical Provider, MD  spironolactone (ALDACTONE) 25 MG tablet Take 12.5 mg by mouth at bedtime.    Yes Historical Provider, MD  sucralfate (CARAFATE) 1 g tablet Take 1 g by mouth 3 (three) times daily.   Yes Historical Provider, MD  verapamil (CALAN-SR) 180 MG CR tablet Take 180 mg by mouth at bedtime.   Yes Historical Provider, MD      PHYSICAL EXAMINATION:   VITAL SIGNS: Blood pressure (!) 160/96, pulse 86, temperature 97.9 F (36.6 C), temperature source Oral, resp. rate 20, height 5\' 5"  (1.651 m), weight 97.5 kg (215 lb), SpO2 95 %.  GENERAL:  65 y.o.-year-old patient lying in the bed with no acute distress.  EYES: Pupils equal, round, reactive to light and accommodation. No scleral icterus. Extraocular muscles intact.  HEENT: Head atraumatic, normocephalic. Oropharynx and nasopharynx clear.  NECK:  Supple, no jugular venous distention. No thyroid enlargement, no tenderness.  LUNGS: Normal breath sounds bilaterally, no wheezing, rales,rhonchi or crepitation. No use of accessory muscles of respiration.  CARDIOVASCULAR: S1, S2 normal. No murmurs, rubs, or  gallops.  ABDOMEN: Soft, nontender, nondistended. Bowel sounds present. No organomegaly or mass.  EXTREMITIES: No pedal edema, cyanosis, or clubbing.  NEUROLOGIC: Cranial nerves II through XII are intact. Muscle strength 5/5 in all extremities. Sensation intact. Gait not checked.  PSYCHIATRIC: The patient is alert and oriented x 3.  SKIN: No obvious rash, lesion, or ulcer.   LABORATORY PANEL:   CBC  Recent Labs Lab 01/15/16 0018  WBC 8.9  HGB 13.3  HCT 39.1*  PLT 252  MCV 93.1  MCH 31.5  MCHC 33.9  RDW 14.5  LYMPHSABS 1.5  MONOABS 0.5  EOSABS 0.1  BASOSABS 0.1   ------------------------------------------------------------------------------------------------------------------  Chemistries   Recent Labs Lab 01/15/16 0018  NA 137  K 4.1  CL 101  CO2 28  GLUCOSE 269*  BUN 27*  CREATININE 1.71*  CALCIUM 8.9  MG 2.0   ------------------------------------------------------------------------------------------------------------------ estimated creatinine clearance is 46.2 mL/min (by C-G formula based on SCr of 1.71 mg/dL (H)). ------------------------------------------------------------------------------------------------------------------ No results for input(s): TSH, T4TOTAL, T3FREE, THYROIDAB in the last 72 hours.  Invalid input(s): FREET3   Coagulation profile No results for input(s):  INR, PROTIME in the last 168 hours. ------------------------------------------------------------------------------------------------------------------- No results for input(s): DDIMER in the last 72 hours. -------------------------------------------------------------------------------------------------------------------  Cardiac Enzymes  Recent Labs Lab 01/15/16 0018  TROPONINI <0.03   ------------------------------------------------------------------------------------------------------------------ Invalid input(s):  POCBNP  ---------------------------------------------------------------------------------------------------------------  Urinalysis No results found for: COLORURINE, APPEARANCEUR, LABSPEC, PHURINE, GLUCOSEU, HGBUR, BILIRUBINUR, KETONESUR, PROTEINUR, UROBILINOGEN, NITRITE, LEUKOCYTESUR   RADIOLOGY: Dg Chest Portable 1 View  Result Date: 01/15/2016 CLINICAL DATA:  Irregular heart rhythm.  Palpitations. EXAM: PORTABLE CHEST 1 VIEW COMPARISON:  09/10/2015 FINDINGS: Cardiac pacemaker. Shallow inspiration with linear atelectasis in the lung bases. No blunting of costophrenic angles. No pneumothorax. No focal consolidation. Heart size and pulmonary vascularity are normal. Mediastinal contours appear intact. IMPRESSION: Shallow inspiration with linear atelectasis in the lung bases. No evidence of active pulmonary disease. Electronically Signed   By: Lucienne Capers M.D.   On: 01/15/2016 00:44    EKG: Orders placed or performed during the hospital encounter of 01/15/16  . ED EKG  . ED EKG    IMPRESSION AND PLAN: 65 year old male patient with history of coronary artery disease, defibrillator, COPD, diabetes mellitus, hypertension presented to the emergency room with funny sensation in the chest. Patient was found to have runs of ventricular tachycardia by the EMS and was given amiodarone infusion 150 mg prior to arrival to the emergency room. Admitting diagnosis 1. Ventricular tachyarrhythmia 2. Coronary artery disease 3. COPD 4. Type 2 diabetes mellitus 5. Hypertension Treatment plan Admit patient to telemetry observation bed Cardiology consultation Cycle troponin to rule out ischemia Resume home cardiac medications DVT prophylaxis with subcutaneous Lovenox 40 MG daily Medical management for diabetes mellitus Monitor heart rate on telemetry for any new episodes of ventricular tachycardia. Monitor electrolytes.  All the records are reviewed and case discussed with ED  provider. Management plans discussed with the patient, family and they are in agreement.  CODE STATUS:FULL CODE Code Status History    Date Active Date Inactive Code Status Order ID Comments User Context   09/10/2015  8:48 PM 09/13/2015  8:16 PM Full Code [id number]  Idelle Crouch, MD Inpatient   09/06/2015  3:06 PM 09/06/2015  6:56 PM Full Code [id number]  Isaias Cowman, MD Inpatient       TOTAL TIME TAKING CARE OF THIS PATIENT: 54 minutes.    Saundra Shelling M.D on 01/15/2016 at 2:36 AM  Between 7am to 6pm - Pager - [phone]  After 6pm go to [email or web address] - password [email or web address] [facility]  [facility]  Office  [phone]  CC: Primary care physician; [place]

## 2016-01-15 NOTE — ED Notes (Signed)
Pt noted to have a 10-15 beat run of V-tach, which was captured and printed out on telemetry strip and given to Dr Alfred Levins. Code cart placed in room and pads placed on pt at this time.

## 2016-01-15 NOTE — Discharge Summary (Signed)
[facility] at [place] NAME: Lance Nunez    MR#:  [id number]  DATE OF BIRTH:  02-13-1951  DATE OF ADMISSION:  01/15/2016 ADMITTING PHYSICIAN: Saundra Shelling, MD  DATE OF DISCHARGE: **01/15/2016  PRIMARY CARE PHYSICIAN: [facility]    ADMISSION DIAGNOSIS:  Ventricular tachycardia ([place]) [I47.2]  DISCHARGE DIAGNOSIS:  Principal Problem:   Ventricular arrhythmia   SECONDARY DIAGNOSIS:   Past Medical History:  Diagnosis Date  . COPD (chronic obstructive pulmonary disease) ([place])   . Coronary artery disease   . Diabetes mellitus without complication ([place])   . Hypertension   . Myocardial infarction    95' 97' 98'    HOSPITAL COURSE:   65 year old male with known CAD status post MI and moderate dilated cardiomyopathy with chronic systolic heart failure/AICD and diabetes who presents with ventricular tachycardia. 1. Ventricular tachycardia: Patient of value by cardiology with recommendations to start amiodarone 200 mg by mouth twice a day. Plan to decrease dose of amiodarone after 2 weeks. Patient will follow-up with cardiology in 1 week.  2. Ischemic cardiomyopathy status post AICD placement: Continue Coreg, Aldactone, aspirin, quinapril and statin  3. Diabetes: Patient will continue ADA diet with glipizide/metformin  4. Essential hypertension: Continue verapamil, Aldactone, quinapril and Coreg  DISCHARGE CONDITIONS AND DIET:   Stable on diabetic heart healthy diet  CONSULTS OBTAINED:  Treatment Team:  Corey Skains, MD  DRUG ALLERGIES:   Allergies  Allergen Reactions  . Prednisone Other (See Comments)  . Axid [Nizatidine]   . Codeine Itching    DISCHARGE MEDICATIONS:   Current Discharge Medication List    START taking these medications   Details  amiodarone (PACERONE) 200 MG tablet Take 1 tablet (200 mg total) by mouth 2 (two) times daily. Qty: 60 tablet, Refills: 0      CONTINUE these  medications which have NOT CHANGED   Details  aspirin 81 MG tablet Take 81 mg by mouth daily.    carvedilol (COREG) 6.25 MG tablet Take 6.25 mg by mouth 2 (two) times daily with a meal.    gabapentin (NEURONTIN) 600 MG tablet Take 600 mg by mouth 3 (three) times daily.    gemfibrozil (LOPID) 600 MG tablet Take 600 mg by mouth 2 (two) times daily before a meal.    glipiZIDE (GLUCOTROL) 10 MG tablet Take 10 mg by mouth daily before breakfast.    lovastatin (MEVACOR) 40 MG tablet Take 80 mg by mouth at bedtime.     metFORMIN (GLUCOPHAGE) 500 MG tablet Take 500 mg by mouth daily at 6 (six) AM.    nitroGLYCERIN (NITROSTAT) 0.4 MG SL tablet Place 0.4 mg under the tongue every 5 (five) minutes as needed for chest pain.    quinapril (ACCUPRIL) 10 MG tablet Take 10 mg by mouth daily.    ranitidine (ZANTAC) 150 MG tablet Take 150 mg by mouth 2 (two) times daily as needed for heartburn.     spironolactone (ALDACTONE) 25 MG tablet Take 12.5 mg by mouth at bedtime.     sucralfate (CARAFATE) 1 g tablet Take 1 g by mouth 3 (three) times daily.    verapamil (CALAN-SR) 180 MG CR tablet Take 180 mg by mouth at bedtime.              Today   CHIEF COMPLAINT:  Patient doing well this morning. No episodes of ventricular tachycardia no chest pain or shortness of breath   VITAL SIGNS:  Blood pressure 133/80,  pulse 75, temperature 97.4 F (36.3 C), temperature source Oral, resp. rate 14, height 5\' 5"  (1.651 m), weight 97.5 kg (215 lb), SpO2 95 %.   REVIEW OF SYSTEMS:  Review of Systems  Constitutional: Negative.  Negative for chills, fever and malaise/fatigue.  HENT: Negative.  Negative for ear discharge, ear pain, hearing loss, nosebleeds and sore throat.   Eyes: Negative.  Negative for blurred vision and pain.  Respiratory: Negative.  Negative for cough, hemoptysis, shortness of breath and wheezing.   Cardiovascular: Negative.  Negative for chest pain, palpitations and leg swelling.   Gastrointestinal: Negative.  Negative for abdominal pain, blood in stool, diarrhea, nausea and vomiting.  Genitourinary: Negative.  Negative for dysuria.  Musculoskeletal: Negative.  Negative for back pain.  Skin: Negative.   Neurological: Negative for dizziness, tremors, speech change, focal weakness, seizures and headaches.  Endo/Heme/Allergies: Negative.  Does not bruise/bleed easily.  Psychiatric/Behavioral: Negative.  Negative for depression, hallucinations and suicidal ideas.     PHYSICAL EXAMINATION:  GENERAL:  65 y.o.-year-old patient lying in the bed with no acute distress.  NECK:  Supple, no jugular venous distention. No thyroid enlargement, no tenderness.  LUNGS: Normal breath sounds bilaterally, no wheezing, rales,rhonchi  No use of accessory muscles of respiration.  CARDIOVASCULAR: S1, S2 normal. No murmurs, rubs, or gallops.  AICD ABDOMEN: Soft, non-tender, non-distended. Bowel sounds present. No organomegaly or mass.  EXTREMITIES: No pedal edema, cyanosis, or clubbing.  PSYCHIATRIC: The patient is alert and oriented x 3.  SKIN: No obvious rash, lesion, or ulcer.   DATA REVIEW:   CBC  Recent Labs Lab 01/15/16 0413  WBC 9.1  HGB 12.6*  HCT 37.5*  PLT 246    Chemistries   Recent Labs Lab 01/15/16 0018 01/15/16 0413  NA 137 139  K 4.1 4.5  CL 101 107  CO2 28 27  GLUCOSE 269* 189*  BUN 27* 26*  CREATININE 1.71* 1.53*  CALCIUM 8.9 8.8*  MG 2.0  --     Cardiac Enzymes  Recent Labs Lab 01/15/16 0018 01/15/16 0633  TROPONINI <0.03 <0.03    Microbiology Results  @[email or web address] @  RADIOLOGY:  Dg Chest Portable 1 View  Result Date: 01/15/2016 CLINICAL DATA:  Irregular heart rhythm.  Palpitations. EXAM: PORTABLE CHEST 1 VIEW COMPARISON:  09/10/2015 FINDINGS: Cardiac pacemaker. Shallow inspiration with linear atelectasis in the lung bases. No blunting of costophrenic angles. No pneumothorax. No focal consolidation. Heart size and pulmonary vascularity  are normal. Mediastinal contours appear intact. IMPRESSION: Shallow inspiration with linear atelectasis in the lung bases. No evidence of active pulmonary disease. Electronically Signed   By: Lucienne Capers M.D.   On: 01/15/2016 00:44      Management plans discussed with the patient and he is in agreement. Stable for discharge home  Patient should follow up with pcp AND CARDIOLOGY  CODE STATUS:     Code Status Orders        Start     Ordered   01/15/16 0351  Full code  Continuous     01/15/16 0351    Code Status History    Date Active Date Inactive Code Status Order ID Comments User Context   09/10/2015  8:48 PM 09/13/2015  8:16 PM Full Code [id number]  Idelle Crouch, MD Inpatient   09/06/2015  3:06 PM 09/06/2015  6:56 PM Full Code [id number]  Isaias Cowman, MD Inpatient      TOTAL TIME TAKING CARE OF THIS PATIENT: 38 minutes.    Note: This dictation  was prepared with Dragon dictation along with smaller phrase technology. Any transcriptional errors that result from this process are unintentional.  Didi Ganaway M.D on 01/15/2016 at 12:21 PM  Between 7am to 6pm - Pager - [phone] After 6pm go to [email or web address] - password [email or web address] [facility]  Office  [phone]  CC: Primary care physician; [place]

## 2016-01-15 NOTE — ED Provider Notes (Signed)
[facility] Emergency Department Provider Note  ____________________________________________  Time seen: Approximately 12:16 AM  I have reviewed the triage vital signs and the nursing notes.   HISTORY  Chief Complaint Irregular Heart Beat   HPI Lance Nunez is a 65 y.o. male with a history of COPD, CAD status post multiple MIs with an ICD, DM, HTN who presents for evaluation of palpitations. Patient reports that he was in his usual state of health until earlier this evening watching a football game when he developed palpitations and reports that he felt dizzy. He called EMS. When EMS arrived the patient was back to his baseline. EKG and vital signs were within normal limits. When EMS was about to disconnect patient from the monitor he started having similar symptoms of palpitation and dizziness. Patient was noted to have 12-15 runs of V. tach on the monitor. Patient remained hemodynamically stable with normal mental status. Patient was started on amiodarone 150 mg IV. Patient denies being shocked by his defibrilator. He reports that this is his 3rd defibrillator and this one was implanted in 08/2015. Patient denies having any symptoms at this time. Patient denies chest pain or shortness of breath. Patient endorse recent URI for which he was treated with azithromycin. He finish his course of azithromycin a week ago. No fever, chills, SOB, wheezing, nausea, vomiting.  Past Medical History:  Diagnosis Date  . COPD (chronic obstructive pulmonary disease) ([place])   . Coronary artery disease   . Diabetes mellitus without complication ([place])   . Hypertension   . Myocardial infarction    95' 97' 98'    Patient Active Problem List   Diagnosis Date Noted  . 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 Dilated cardiomyopathy ([place]) 09/10/2015  . Diabetes mellitus without complication ([place]) [id number]  .  HTN, goal below 140/80 09/10/2015    Past Surgical History:  Procedure Laterality Date  . Fractured leg Right   . IMPLANTABLE CARDIOVERTER DEFIBRILLATOR (ICD) GENERATOR CHANGE N/A 09/06/2015   Procedure: ICD GENERATOR CHANGE;  Surgeon: Isaias Cowman, MD;  Location: [facility] ORS;  Service: Cardiovascular;  Laterality: N/A;  . IMPLANTABLE CARDIOVERTER DEFIBRILLATOR (ICD) GENERATOR CHANGE Left 09/13/2015   Procedure: ICD GENERATOR CHANGE/REVISION;  Surgeon: Isaias Cowman, MD;  Location: [facility] ORS;  Service: Cardiovascular;  Laterality: Left;  . Stents N/A    4 cardiac stents    Prior to Admission medications   Medication Sig Start Date End Date Taking? Authorizing Provider  aspirin 81 MG tablet Take 81 mg by mouth daily.   Yes Historical Provider, MD  carvedilol (COREG) 6.25 MG tablet Take 6.25 mg by mouth 2 (two) times daily with a meal.   Yes Historical Provider, MD  gabapentin (NEURONTIN) 600 MG tablet Take 600 mg by mouth 3 (three) times daily.   Yes Historical Provider, MD  gemfibrozil (LOPID) 600 MG tablet Take 600 mg by mouth 2 (two) times daily before a meal.   Yes Historical Provider, MD  glipiZIDE (GLUCOTROL) 10 MG tablet Take 10 mg by mouth daily before breakfast.   Yes Historical Provider, MD  lovastatin (MEVACOR) 40 MG tablet Take 80 mg by mouth at bedtime.    Yes Historical Provider, MD  metFORMIN (GLUCOPHAGE) 500 MG tablet Take 500 mg by mouth daily at 6 (six) AM.   Yes Historical Provider, MD  nitroGLYCERIN (NITROSTAT) 0.4 MG SL tablet Place 0.4 mg under the tongue every 5 (five) minutes as needed for chest pain.  Yes Historical Provider, MD  quinapril (ACCUPRIL) 10 MG tablet Take 10 mg by mouth daily.   Yes Historical Provider, MD  ranitidine (ZANTAC) 150 MG tablet Take 150 mg by mouth 2 (two) times daily as needed for heartburn.    Yes Historical Provider, MD  spironolactone (ALDACTONE) 25 MG tablet Take 12.5 mg by mouth at bedtime.    Yes Historical Provider, MD    sucralfate (CARAFATE) 1 g tablet Take 1 g by mouth 3 (three) times daily.   Yes Historical Provider, MD  verapamil (CALAN-SR) 180 MG CR tablet Take 180 mg by mouth at bedtime.   Yes Historical Provider, MD    Allergies Prednisone; Axid [nizatidine]; and Codeine  History reviewed. No pertinent family history.  Social History Social History  Substance Use Topics  . Smoking status: Former Smoker    Quit date: 07/19/2004  . Smokeless tobacco: Never Used  . Alcohol use No    Review of Systems Constitutional: Negative for fever. + dizziness Eyes: Negative for visual changes. ENT: Negative for sore throat. Neck: No neck pain  Cardiovascular: Negative for chest pain. + palpitations Respiratory: Negative for shortness of breath. Gastrointestinal: Negative for abdominal pain, vomiting or diarrhea. Genitourinary: Negative for dysuria. Musculoskeletal: Negative for back pain. Skin: Negative for rash. Neurological: Negative for headaches, weakness or numbness. Psych: No SI or HI  ____________________________________________   PHYSICAL EXAM:  VITAL SIGNS: Vitals:   01/15/16 0235 01/15/16 0348  BP: (!) 135/91 (!) 151/88  Pulse: 86 85  Resp: 20 18  Temp:  98.6 F (37 C)   Constitutional: Alert and oriented. Well appearing and in no apparent distress. HEENT:      Head: Normocephalic and atraumatic.         Eyes: Conjunctivae are normal. Sclera is non-icteric. EOMI. PERRL      Mouth/Throat: Mucous membranes are moist.       Neck: Supple with no signs of meningismus. Cardiovascular: Regular rate and rhythm. No murmurs, gallops, or rubs. 2+ symmetrical distal pulses are present in all extremities. No JVD. Respiratory: Normal respiratory effort. Lungs are clear to auscultation bilaterally. No wheezes, crackles, or rhonchi. Decrease air movement bilaterally Gastrointestinal: Soft, non tender, and non distended with positive bowel sounds. No rebound or guarding. Genitourinary: No CVA  tenderness. Musculoskeletal: Nontender with normal range of motion in all extremities. No edema, cyanosis, or erythema of extremities. Neurologic: Normal speech and language. Face is symmetric. Moving all extremities. No gross focal neurologic deficits are appreciated. Skin: Skin is warm, dry and intact. No rash noted. Psychiatric: Mood and affect are normal. Speech and behavior are normal.  ____________________________________________   LABS (all labs ordered are listed, but only abnormal results are displayed)  Labs Reviewed  CBC WITH DIFFERENTIAL/PLATELET - Abnormal; Notable for the following:       Result Value   RBC 4.20 (*)    HCT 39.1 (*)    Neutro Abs 6.6 (*)    All other components within normal limits  BASIC METABOLIC PANEL - Abnormal; Notable for the following:    Glucose, Bld 269 (*)    BUN 27 (*)    Creatinine, Ser 1.71 (*)    GFR calc non Af Amer 40 (*)    GFR calc Af Amer 47 (*)    All other components within normal limits  BASIC METABOLIC PANEL - Abnormal; Notable for the following:    Glucose, Bld 189 (*)    BUN 26 (*)    Creatinine, Ser 1.53 (*)  Calcium 8.8 (*)    GFR calc non Af Amer 46 (*)    GFR calc Af Amer 53 (*)    All other components within normal limits  CBC - Abnormal; Notable for the following:    RBC 4.01 (*)    Hemoglobin 12.6 (*)    HCT 37.5 (*)    All other components within normal limits  MAGNESIUM  TROPONIN I  TROPONIN I  TROPONIN I  TROPONIN I   ____________________________________________  EKG  ED ECG REPORT I, Rudene Re, the attending physician, personally viewed and interpreted this ECG.  Normal sinus rhythm, rate of 88, left bundle branch block, normal QTC, normal axis, no ST elevations or depressions. Prior EKG showing incomplete left bundle branch block. ____________________________________________  RADIOLOGY  CXR: Negative. Pacemaker leads in  place ____________________________________________   PROCEDURES  Procedure(s) performed: None Procedures Critical Care performed:  Yes  CRITICAL CARE Performed by: Rudene Re  ?  Total critical care time: 35 min  Critical care time was exclusive of separately billable procedures and treating other patients.  Critical care was necessary to treat or prevent imminent or life-threatening deterioration.  Critical care was time spent personally by me on the following activities: development of treatment plan with patient and/or surrogate as well as nursing, discussions with consultants, evaluation of patient's response to treatment, examination of patient, obtaining history from patient or surrogate, ordering and performing treatments and interventions, ordering and review of laboratory studies, ordering and review of radiographic studies, pulse oximetry and re-evaluation of patient's condition.  ____________________________________________   INITIAL IMPRESSION / ASSESSMENT AND PLAN / ED COURSE  65 y.o. male with a history of COPD, CAD status post multiple MIs with an ICD, DM, HTN who presents for evaluation of palpitations and found to be in stable Vtach by EMS. Currently receiving amiodarone. Defibrillator did not shock patient. Patient asymptomatic at this time in NSR. Plan for labs, EKG, CXR. Will monitor on telemetry.  Clinical Course as of Jan 15 720  Mon Jan 15, 2016  0103 Patient with two rounds of Vtach, with stable mental status and VS. Amiodarone done. Labs with no acute findings. Troponin is negative. Will admit to Hospitalist.  [CV]  0143 The vital was interrogated. I spoke with the technician from [employer] who reports that the device is capturing the V. tach appropriately. Device is set up to start pacing the patient rates more than 16 beats of V. tach. So far patient has been up to 12 beats of V. tach in the emergency room. I have discussed patient's presentation,  workup, lab work, and results of pacer interrogation with Dr. Saralyn Pilar who recommended no further interventions this evening other than monitoring. Patient will be admitted to the hospitalist.  [CV]    Clinical Course User Index [CV] Rudene Re, MD    Pertinent labs & imaging results that were available during my care of the patient were reviewed by me and considered in my medical decision making (see chart for details).    ____________________________________________   FINAL CLINICAL IMPRESSION(S) / ED DIAGNOSES  Final diagnoses:  Ventricular tachycardia ([place])      NEW MEDICATIONS STARTED DURING THIS VISIT:  Current Discharge Medication List       Note:  This document was prepared using Dragon voice recognition software and may include unintentional dictation errors.    Rudene Re, MD 01/15/16 [phone]

## 2016-01-15 NOTE — Progress Notes (Signed)
Pt tobe discharged today. Iv and tele removed. disch instructions and prescrips given to pt. disch via w.c. Accompanied by family

## 2016-01-15 NOTE — Care Management (Signed)
Patient understands all the services provided, at this time the patient does not want anything, he seemed focused on ordering his breakfast for the morning.

## 2016-01-15 NOTE — Consult Note (Signed)
[facility] Consultation Note  Patient ID: Lance Nunez, MRN: [id number], DOB/AGE: 65-Dec-1952 65 y.o. Admit date: 01/15/2016   Date of Consult: 01/15/2016 Primary Physician: [place] Primary Cardiologist: Nehemiah Massed  Chief Complaint:  Chief Complaint  Patient presents with  . Irregular Heart Beat   Reason for Consult: ventricular tachycardia  HPI: 65 y.o. male with known coronary artery disease status post previous myocardial infarction and moderate dilated cardiomyopathy with chronic systolic dysfunction heart failure diabetes with complication essential hypertension and history of the atrial fibrillation and ventricular tachycardia although on appropriate medication management doing fairly well for many years. The patient recently has had some palpitations for which showed no evidence of atrial fibrillation or sustained ventricular tachycardia by his ICD. At that time the patient had a change in his medical management with an increased dose of carvedilol to help with reduction in cardiovascular event and/or ventricular tachycardia. Over the last several weeks he is had an increase in frequency longer episodes of palpitations for which he has had ICD interrogation showing nonsustained wide-complex tachycardia at a slow rate. The patient has not had any shocks due to these long sustained ventricular tach artery via although he has had significant symptoms. Previously he has had on amiodarone in the past and was given intravenous amiodarone and has no further episodes at this time. He has had excellent treatment of chronic systolic dysfunction heart failure with beta blocker carvedilol lisinopril 5 mg and Aldactone. The patient also has coronary artery disease which is been stable and currently does not have an myocardial infarction with a normal troponin. Other medication management including hypertension control with lobe medication management as well as stable lipid  management. The patient is feeling well today with no further palpitations  Past Medical History:  Diagnosis Date  . COPD (chronic obstructive pulmonary disease) ([place])   . Coronary artery disease   . Diabetes mellitus without complication ([place])   . Hypertension   . Myocardial infarction    95' 97' 98'      Surgical History:  Past Surgical History:  Procedure Laterality Date  . Fractured leg Right   . IMPLANTABLE CARDIOVERTER DEFIBRILLATOR (ICD) GENERATOR CHANGE N/A 09/06/2015   Procedure: ICD GENERATOR CHANGE;  Surgeon: Isaias Cowman, MD;  Location: [facility] ORS;  Service: Cardiovascular;  Laterality: N/A;  . IMPLANTABLE CARDIOVERTER DEFIBRILLATOR (ICD) GENERATOR CHANGE Left 09/13/2015   Procedure: ICD GENERATOR CHANGE/REVISION;  Surgeon: Isaias Cowman, MD;  Location: [facility] ORS;  Service: Cardiovascular;  Laterality: Left;  . Stents N/A    4 cardiac stents     Home Meds: Prior to Admission medications   Medication Sig Start Date End Date Taking? Authorizing Provider  aspirin 81 MG tablet Take 81 mg by mouth daily.   Yes Historical Provider, MD  carvedilol (COREG) 6.25 MG tablet Take 6.25 mg by mouth 2 (two) times daily with a meal.   Yes Historical Provider, MD  gabapentin (NEURONTIN) 600 MG tablet Take 600 mg by mouth 3 (three) times daily.   Yes Historical Provider, MD  gemfibrozil (LOPID) 600 MG tablet Take 600 mg by mouth 2 (two) times daily before a meal.   Yes Historical Provider, MD  glipiZIDE (GLUCOTROL) 10 MG tablet Take 10 mg by mouth daily before breakfast.   Yes Historical Provider, MD  lovastatin (MEVACOR) 40 MG tablet Take 80 mg by mouth at bedtime.    Yes Historical Provider, MD  metFORMIN (GLUCOPHAGE) 500 MG tablet Take 500 mg by mouth daily at 6 (  six) AM.   Yes Historical Provider, MD  nitroGLYCERIN (NITROSTAT) 0.4 MG SL tablet Place 0.4 mg under the tongue every 5 (five) minutes as needed for chest pain.   Yes Historical Provider, MD  quinapril (ACCUPRIL) 10  MG tablet Take 10 mg by mouth daily.   Yes Historical Provider, MD  ranitidine (ZANTAC) 150 MG tablet Take 150 mg by mouth 2 (two) times daily as needed for heartburn.    Yes Historical Provider, MD  spironolactone (ALDACTONE) 25 MG tablet Take 12.5 mg by mouth at bedtime.    Yes Historical Provider, MD  sucralfate (CARAFATE) 1 g tablet Take 1 g by mouth 3 (three) times daily.   Yes Historical Provider, MD  verapamil (CALAN-SR) 180 MG CR tablet Take 180 mg by mouth at bedtime.   Yes Historical Provider, MD    Inpatient Medications:  . aspirin  81 mg Oral Daily  . carvedilol  6.25 mg Oral BID WC  . enoxaparin (LOVENOX) injection  40 mg Subcutaneous Q24H  . famotidine  10 mg Oral Daily  . gabapentin  600 mg Oral TID  . gemfibrozil  600 mg Oral BID AC  . glipiZIDE  10 mg Oral QAC breakfast  . [START ON 01/16/2016] Influenza vac split quadrivalent PF  0.5 mL Intramuscular Tomorrow-1000  . lisinopril  5 mg Oral Daily  . [START ON 01/16/2016] pneumococcal 23 valent vaccine  0.5 mL Intramuscular Tomorrow-1000  . pravastatin  10 mg Oral q1800  . sodium chloride flush  3 mL Intravenous Q12H  . sodium chloride flush  3 mL Intravenous Q12H  . spironolactone  12.5 mg Oral QHS  . sucralfate  1 g Oral TID  . verapamil  180 mg Oral QHS     Allergies:  Allergies  Allergen Reactions  . Prednisone Other (See Comments)  . Axid [Nizatidine]   . Codeine Itching    Social History   Social History  . Marital status: Widowed    Spouse name: N/A  . Number of children: N/A  . Years of education: N/A   Occupational History  . disabled    Social History Main Topics  . Smoking status: Former Smoker    Quit date: 07/19/2004  . Smokeless tobacco: Never Used  . Alcohol use No  . Drug use: No  . Sexual activity: Not on file   Other Topics Concern  . Not on file   Social History Narrative  . No narrative on file     History reviewed. No pertinent family history.   Review of  Systems Positive for Palpitations irregular heart beat Negative for: General:  chills, fever, night sweats or weight changes.  Cardiovascular: PND orthopnea syncope dizziness  Dermatological skin lesions rashes Respiratory: Cough congestion Urologic: Frequent urination urination at night and hematuria Abdominal: negative for nausea, vomiting, diarrhea, bright red blood per rectum, melena, or hematemesis Neurologic: negative for visual changes, and/or hearing changes  All other systems reviewed and are otherwise negative except as noted above.  Labs:  Recent Labs  01/15/16 0018 01/15/16 0633  TROPONINI <0.03 <0.03   Lab Results  Component Value Date   WBC 9.1 01/15/2016   HGB 12.6 (L) 01/15/2016   HCT 37.5 (L) 01/15/2016   MCV 93.5 01/15/2016   PLT 246 01/15/2016    Recent Labs Lab 01/15/16 0413  NA 139  K 4.5  CL 107  CO2 27  BUN 26*  CREATININE 1.53*  CALCIUM 8.8*  GLUCOSE 189*   Lab Results  Component Value Date   CHOL 160 12/13/2012   HDL 33 (L) 12/13/2012   LDLCALC 61 12/13/2012   TRIG 328 (H) 12/13/2012   No results found for: DDIMER  Radiology/Studies:  Dg Chest Portable 1 View  Result Date: 01/15/2016 CLINICAL DATA:  Irregular heart rhythm.  Palpitations. EXAM: PORTABLE CHEST 1 VIEW COMPARISON:  09/10/2015 FINDINGS: Cardiac pacemaker. Shallow inspiration with linear atelectasis in the lung bases. No blunting of costophrenic angles. No pneumothorax. No focal consolidation. Heart size and pulmonary vascularity are normal. Mediastinal contours appear intact. IMPRESSION: Shallow inspiration with linear atelectasis in the lung bases. No evidence of active pulmonary disease. Electronically Signed   By: Lucienne Capers M.D.   On: 01/15/2016 00:44    EKG: Normal sinus rhythm with left intraventricular conduction defect  Weights: Filed Weights   01/15/16 0019  Weight: 97.5 kg (215 lb)     Physical Exam: Blood pressure (!) 143/94, pulse 85, temperature  98.8 F (37.1 C), temperature source Oral, resp. rate 16, height 5\' 5"  (1.651 m), weight 97.5 kg (215 lb), SpO2 94 %. Body mass index is 35.78 kg/m. General: Well developed, well nourished, in no acute distress. Head eyes ears nose throat: Normocephalic, atraumatic, sclera non-icteric, no xanthomas, nares are without discharge. No apparent thyromegaly and/or mass  Lungs: Normal respiratory effort.  no wheezes, no rales, no rhonchi.  Heart: RRR with normal S1 S2. Apical murmur gallop, no rub, PMI is normal size and placement, carotid upstroke normal without bruit, jugular venous pressure is normal Abdomen: Soft, non-tender,  distended with normoactive bowel sounds. No hepatomegaly. No rebound/guarding. No obvious abdominal masses. Abdominal aorta is normal size without bruit Extremities: Trace edema. no cyanosis, no clubbing, no ulcers  Peripheral : 2+ bilateral upper extremity pulses, 2+ bilateral femoral pulses, 2+ bilateral dorsal pedal pulse Neuro: Alert and oriented. No facial asymmetry. No focal deficit. Moves all extremities spontaneously. Musculoskeletal: Normal muscle tone without kyphosis Psych:  Responds to questions appropriately with a normal affect.    Assessment: 65 year old male with known coronary artery disease status post previous myocardial infarction LV systolic dysfunction with chronic systolic dysfunction heart failure without current evidence of myocardial infarction diabetes with complications essential hypertension having nonsustained wide-complex tachycardia consistent with ventricular tachycardia not requiring ICD shocks  Plan: 1. Amiodarone 200 mg twice per day for further risk reduction in her continued episodes of ventricular tachycardia 2. Begin ambulation and follow for any further episodes of palpitations tachycardia requiring adjustments of medication but will likely decrease dose of amiodarone after 2 weeks of treatment as an outpatient 3. Continue congestive  heart failure treatment with appropriate medications including Aldactone beta blocker and lisinopril 4. High intensity cholesterol therapy with pravastatin 5. No further cardiac diagnostics necessary at this time due to no exacerbation of congestive heart failure or myocardial infarction 6. Begin ambulation and follow for improvements of symptoms and now okay for discharge home from cardiac standpoint with above change in medication management for follow-up in 2 weeks.  Signed, Corey Skains M.D. [facility] 01/15/2016, 8:29 AM

## 2016-01-15 NOTE — Progress Notes (Signed)
Inpatient Diabetes Program Recommendations  AACE/ADA: New Consensus Statement on Inpatient Glycemic Control (2015)  Target Ranges:  Prepandial:   less than 140 mg/dL      Peak postprandial:   less than 180 mg/dL (1-2 hours)      Critically ill patients:  140 - 180 mg/dL   Results for JABIER, CULHANE (MRN [id number]) as of 01/15/2016 11:16  Ref. Range 01/15/2016 00:18 01/15/2016 04:13  Glucose Latest Ref Range: 65 - 99 mg/dL 269 (H) 189 (H)     Admit with: Ventricular Tachycardia  History: DM, COPD, MI  Home DM Meds: Metformin 500 mg daily       Glipizide 10 mg daily  Current Insulin Orders: Glipizide 10 mg daily       MD- Please consider placing orders for Novolog Moderate Correction Scale/ SSI (0-15 units) TID AC + HS      --Will follow patient during hospitalization--  Wyn Quaker RN, MSN, CDE Diabetes Coordinator Inpatient Glycemic Control Team Team Pager: [phone] (8a-5p)

## 2016-01-15 NOTE — ED Notes (Signed)
Amiodarone infusion completed at this time.

## 2016-01-15 NOTE — ED Triage Notes (Signed)
Pt arrives to ED from home via [facility] with c/o irregular heart rhythm. EMS states they were called out for the pt having an "uncomfortable feeling in his chest" and noted occasional PVCs on the cardiac monitor. EMS stated they were about to leave when they noted a short 12-15 beat run of V-tach on the monitor and decided to bring him in for evaluation. EMS states no further episodes of V-tach were captured. Pt has a defibrillator and denies it having fired. Pt denies any c/o CP or SHOB. Pt arrives with NS and Amiodarone infusing at this time.

## 2016-03-11 ENCOUNTER — Emergency Department
Admission: EM | Admit: 2016-03-11 | Discharge: 2016-03-12 | Disposition: A | Discharge status: Home / Self Care | Payer: Medicare Other | Attending: Emergency Medicine | Admitting: Emergency Medicine

## 2016-03-11 ENCOUNTER — Encounter: Payer: Self-pay | Admitting: Emergency Medicine

## 2016-03-11 DIAGNOSIS — K625 Hemorrhage of anus and rectum: Secondary | ICD-10-CM | POA: Diagnosis present

## 2016-03-11 DIAGNOSIS — Z87891 Personal history of nicotine dependence: Secondary | ICD-10-CM | POA: Diagnosis not present

## 2016-03-11 DIAGNOSIS — I10 Essential (primary) hypertension: Secondary | ICD-10-CM | POA: Insufficient documentation

## 2016-03-11 DIAGNOSIS — Z794 Long term (current) use of insulin: Secondary | ICD-10-CM | POA: Insufficient documentation

## 2016-03-11 DIAGNOSIS — E119 Type 2 diabetes mellitus without complications: Secondary | ICD-10-CM | POA: Diagnosis not present

## 2016-03-11 DIAGNOSIS — I252 Old myocardial infarction: Secondary | ICD-10-CM | POA: Diagnosis not present

## 2016-03-11 DIAGNOSIS — Z79899 Other long term (current) drug therapy: Secondary | ICD-10-CM | POA: Diagnosis not present

## 2016-03-11 DIAGNOSIS — J449 Chronic obstructive pulmonary disease, unspecified: Secondary | ICD-10-CM | POA: Diagnosis not present

## 2016-03-11 DIAGNOSIS — K644 Residual hemorrhoidal skin tags: Secondary | ICD-10-CM | POA: Diagnosis not present

## 2016-03-11 DIAGNOSIS — I2510 Atherosclerotic heart disease of native coronary artery without angina pectoris: Secondary | ICD-10-CM | POA: Diagnosis not present

## 2016-03-11 LAB — CBC
HCT: 39.2 % — ABNORMAL LOW (ref 40.0–52.0)
Hemoglobin: 13.4 g/dL (ref 13.0–18.0)
MCH: 32.1 pg (ref 26.0–34.0)
MCHC: 34.2 g/dL (ref 32.0–36.0)
MCV: 93.8 fL (ref 80.0–100.0)
PLATELETS: 211 10*3/uL (ref 150–440)
RBC: 4.17 MIL/uL — AB (ref 4.40–5.90)
RDW: 14.6 % — AB (ref 11.5–14.5)
WBC: 8 10*3/uL (ref 3.8–10.6)

## 2016-03-11 LAB — TYPE AND SCREEN
ABO/RH(D): B POS
ANTIBODY SCREEN: NEGATIVE

## 2016-03-11 LAB — COMPREHENSIVE METABOLIC PANEL
ALT: 29 U/L (ref 17–63)
AST: 32 U/L (ref 15–41)
Albumin: 3.9 g/dL (ref 3.5–5.0)
Alkaline Phosphatase: 70 U/L (ref 38–126)
Anion gap: 7 (ref 5–15)
BILIRUBIN TOTAL: 0.8 mg/dL (ref 0.3–1.2)
BUN: 24 mg/dL — AB (ref 6–20)
CALCIUM: 8.7 mg/dL — AB (ref 8.9–10.3)
CHLORIDE: 100 mmol/L — AB (ref 101–111)
CO2: 28 mmol/L (ref 22–32)
CREATININE: 1.67 mg/dL — AB (ref 0.61–1.24)
GFR calc Af Amer: 48 mL/min — ABNORMAL LOW (ref >60)
GFR, EST NON AFRICAN AMERICAN: 41 mL/min — AB (ref >60)
Glucose, Bld: 201 mg/dL — ABNORMAL HIGH (ref 65–99)
Potassium: 4.2 mmol/L (ref 3.5–5.1)
Sodium: 135 mmol/L (ref 135–145)
TOTAL PROTEIN: 7.2 g/dL (ref 6.5–8.1)

## 2016-03-11 NOTE — ED Notes (Signed)
Pt states he had a flush like feeling and something didn't feel right, he informed RN of his defibrillator , pt denies any CP, VS stable, EKG completed, MD notified

## 2016-03-11 NOTE — ED Triage Notes (Signed)
Patient states after moving bowels this morning, he noticed blood in the toilet.  Patient states he had been straining when using bathroom.  Patient states rectal bleeding has continued all day.

## 2016-03-11 NOTE — ED Notes (Signed)
Pt states he noticed blood after BM this am. Denies any dizziness or weakness. Pt states he has had 2 episodes of rectal bleeding today, describes blood as bright red. Pt A&OX4, VS stable

## 2016-03-12 LAB — HEMOGLOBIN AND HEMATOCRIT, BLOOD
HCT: 40 % (ref 40.0–52.0)
Hemoglobin: 13.8 g/dL (ref 13.0–18.0)

## 2016-03-12 NOTE — Discharge Instructions (Signed)
Return to the emergency room if you start bleeding again, have chest pain or dizziness, shortness of breath, or any other symptoms concerning to you.

## 2016-03-12 NOTE — ED Provider Notes (Signed)
[facility] Emergency Department Provider Note  ____________________________________________  Time seen: Approximately 12:01 AM  I have reviewed the triage vital signs and the nursing notes.   HISTORY  Chief Complaint Rectal Bleeding   HPI Lance Nunez is a 66 y.o. male history of CAD, diabetes, hypertension, COPD who presents for evaluation of rectal bleeding. Patient reports that earlier this morning he was straining in the toilet to have a bowel movement and noticedbright red blood in the toilet. Later in the evening he was at [facility] eating dinner when he got up and saw blood on the chair that he was sitting down. He noticed that the blood was also on his pants. He hasn't had any further bowel movements since this morning. He denies abdominal pain. He does report some rectal discomfort. He is not on any blood thinners. He denies generalized malaise, fatigue, shortness of breath, dizziness, chest pain. Patient denies any prior history of GI bleeds. Last colonoscopy was greater than 10 years ago and normal according to patient. No known history of diverticulosis. He does not take NSAIDs.  Past Medical History:  Diagnosis Date  . COPD (chronic obstructive pulmonary disease) ([place])   . Coronary artery disease   . Diabetes mellitus without complication ([place])   . Hypertension   . Myocardial infarction    95' 97' 98'    Patient Active Problem List   Diagnosis Date Noted  . 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 Dilated cardiomyopathy ([place]) 09/10/2015  . Diabetes mellitus without complication ([place]) [id number]  . HTN, goal below 140/80 09/10/2015    Past Surgical History:  Procedure Laterality Date  . Fractured leg Right   . IMPLANTABLE CARDIOVERTER DEFIBRILLATOR (ICD) GENERATOR CHANGE N/A 09/06/2015   Procedure: ICD GENERATOR CHANGE;  Surgeon: Isaias Cowman, MD;  Location:  [facility] ORS;  Service: Cardiovascular;  Laterality: N/A;  . IMPLANTABLE CARDIOVERTER DEFIBRILLATOR (ICD) GENERATOR CHANGE Left 09/13/2015   Procedure: ICD GENERATOR CHANGE/REVISION;  Surgeon: Isaias Cowman, MD;  Location: [facility] ORS;  Service: Cardiovascular;  Laterality: Left;  . Stents N/A    4 cardiac stents    Prior to Admission medications   Medication Sig Start Date End Date Taking? Authorizing Provider  amiodarone (PACERONE) 200 MG tablet Take 1 tablet (200 mg total) by mouth 2 (two) times daily. 01/15/16   Bettey Costa, MD  aspirin 81 MG tablet Take 81 mg by mouth daily.    Historical Provider, MD  carvedilol (COREG) 6.25 MG tablet Take 6.25 mg by mouth 2 (two) times daily with a meal.    Historical Provider, MD  gabapentin (NEURONTIN) 600 MG tablet Take 600 mg by mouth 3 (three) times daily.    Historical Provider, MD  gemfibrozil (LOPID) 600 MG tablet Take 600 mg by mouth 2 (two) times daily before a meal.    Historical Provider, MD  glipiZIDE (GLUCOTROL) 10 MG tablet Take 10 mg by mouth daily before breakfast.    Historical Provider, MD  lovastatin (MEVACOR) 40 MG tablet Take 80 mg by mouth at bedtime.     Historical Provider, MD  metFORMIN (GLUCOPHAGE) 500 MG tablet Take 500 mg by mouth daily at 6 (six) AM.    Historical Provider, MD  nitroGLYCERIN (NITROSTAT) 0.4 MG SL tablet Place 0.4 mg under the tongue every 5 (five) minutes as needed for chest pain.    Historical Provider, MD  quinapril (ACCUPRIL) 10 MG tablet Take 10 mg by mouth daily.  Historical Provider, MD  ranitidine (ZANTAC) 150 MG tablet Take 150 mg by mouth 2 (two) times daily as needed for heartburn.     Historical Provider, MD  spironolactone (ALDACTONE) 25 MG tablet Take 12.5 mg by mouth at bedtime.     Historical Provider, MD  sucralfate (CARAFATE) 1 g tablet Take 1 g by mouth 3 (three) times daily.    Historical Provider, MD  verapamil (CALAN-SR) 180 MG CR tablet Take 180 mg by mouth at bedtime.    Historical  Provider, MD    Allergies Prednisone; Axid [nizatidine]; and Codeine  No family history on file.  Social History Social History  Substance Use Topics  . Smoking status: Former Smoker    Quit date: 07/19/2004  . Smokeless tobacco: Never Used  . Alcohol use No    Review of Systems  Constitutional: Negative for fever. Eyes: Negative for visual changes. ENT: Negative for sore throat. Neck: No neck pain  Cardiovascular: Negative for chest pain. Respiratory: Negative for shortness of breath. Gastrointestinal: Negative for abdominal pain, vomiting or diarrhea. Genitourinary: Negative for dysuria. + rectal bleeding Musculoskeletal: Negative for back pain. Skin: Negative for rash. Neurological: Negative for headaches, weakness or numbness. Psych: No SI or HI  ____________________________________________   PHYSICAL EXAM:  VITAL SIGNS: ED Triage Vitals  Enc Vitals Group     BP 03/11/16 2300 132/78     Pulse --      Resp 03/11/16 2300 (!) 21     Temp --      Temp src --      SpO2 --      Weight 03/11/16 1922 210 lb (95.3 kg)     Height 03/11/16 1922 5\' 4"  (1.626 m)     Head Circumference --      Peak Flow --      Pain Score 03/11/16 1922 0     Pain Loc --      Pain Edu? --      Excl. in [place]? --     Constitutional: Alert and oriented. Well appearing and in no apparent distress. HEENT:      Head: Normocephalic and atraumatic.         Eyes: Conjunctivae are normal. Sclera is non-icteric. EOMI. PERRL      Mouth/Throat: Mucous membranes are moist.       Neck: Supple with no signs of meningismus. Cardiovascular: Regular rate and rhythm. No murmurs, gallops, or rubs. 2+ symmetrical distal pulses are present in all extremities. No JVD. Respiratory: Normal respiratory effort. Lungs are clear to auscultation bilaterally. No wheezes, crackles, or rhonchi.  Gastrointestinal: Soft, non tender, and non distended with positive bowel sounds. No rebound or guarding. Genitourinary: No  CVA tenderness. Rectal exam shows an external hemorrhoid with a puncture site on it now with a stable blood clot. Anus is covered in red blood. Rectal exam showing brown stool with no melena. Musculoskeletal: Nontender with normal range of motion in all extremities. No edema, cyanosis, or erythema of extremities. Neurologic: Normal speech and language. Face is symmetric. Moving all extremities. No gross focal neurologic deficits are appreciated. Skin: Skin is warm, dry and intact. No rash noted. Psychiatric: Mood and affect are normal. Speech and behavior are normal.  ____________________________________________   LABS (all labs ordered are listed, but only abnormal results are displayed)  Labs Reviewed  COMPREHENSIVE METABOLIC PANEL - Abnormal; Notable for the following:       Result Value   Chloride 100 (*)    Glucose,  Bld 201 (*)    BUN 24 (*)    Creatinine, Ser 1.67 (*)    Calcium 8.7 (*)    GFR calc non Af Amer 41 (*)    GFR calc Af Amer 48 (*)    All other components within normal limits  CBC - Abnormal; Notable for the following:    RBC 4.17 (*)    HCT 39.2 (*)    RDW 14.6 (*)    All other components within normal limits  HEMOGLOBIN AND HEMATOCRIT, BLOOD  POC OCCULT BLOOD, ED  TYPE AND SCREEN   ____________________________________________  EKG  ED ECG REPORT I, Rudene Re, the attending physician, personally viewed and interpreted this ECG.  Normal sinus rhythm, rate of 76, normal QTC, normal axis, no ST elevations or depressions. Unchanged from prior ____________________________________________  RADIOLOGY  none  ____________________________________________   PROCEDURES  Procedure(s) performed: None Procedures Critical Care performed:  None ____________________________________________   INITIAL IMPRESSION / ASSESSMENT AND PLAN / ED COURSE  66 y.o. male history of CAD, diabetes, hypertension, COPD who presents for evaluation of rectal bleeding.  Patient found to have an external hemorrhoid with stable clot and evidence of prior bleed. No active bleeding, no melena. Hgb and VS stable. Patient not on blood thinner. Presentation concerning for hemorrhoid bleed. Patient with no further bleed for 5 hours since arriving to the ED. Will recheck hgb.   _________________________ 1:29 AM on 03/12/2016 -----------------------------------------  Repeat Hemoglobin stable. No further bleeding the emergency room. Patient be discharged home with close follow-up with primary care doctor tomorrow for recheck CBC. Recommend that he returns to the emergency room if the bleeding resumes.     Pertinent labs & imaging results that were available during my care of the patient were reviewed by me and considered in my medical decision making (see chart for details).    ____________________________________________   FINAL CLINICAL IMPRESSION(S) / ED DIAGNOSES  Final diagnoses:  External hemorrhoid, bleeding      NEW MEDICATIONS STARTED DURING THIS VISIT:  New Prescriptions   No medications on file     Note:  This document was prepared using Dragon voice recognition software and may include unintentional dictation errors.    Rudene Re, MD 03/12/16 0130

## 2016-12-14 ENCOUNTER — Encounter (HOSPITAL_COMMUNITY): Payer: Self-pay | Admitting: *Deleted

## 2016-12-14 ENCOUNTER — Emergency Department (HOSPITAL_COMMUNITY)
Admission: EM | Admit: 2016-12-14 | Discharge: 2016-12-14 | Disposition: A | Discharge status: Home / Self Care | Payer: Medicare Other | Attending: Emergency Medicine | Admitting: Emergency Medicine

## 2016-12-14 DIAGNOSIS — Z7984 Long term (current) use of oral hypoglycemic drugs: Secondary | ICD-10-CM | POA: Insufficient documentation

## 2016-12-14 DIAGNOSIS — I10 Essential (primary) hypertension: Secondary | ICD-10-CM | POA: Insufficient documentation

## 2016-12-14 DIAGNOSIS — I252 Old myocardial infarction: Secondary | ICD-10-CM | POA: Diagnosis not present

## 2016-12-14 DIAGNOSIS — Z79899 Other long term (current) drug therapy: Secondary | ICD-10-CM | POA: Insufficient documentation

## 2016-12-14 DIAGNOSIS — Z87891 Personal history of nicotine dependence: Secondary | ICD-10-CM | POA: Diagnosis not present

## 2016-12-14 DIAGNOSIS — Z95810 Presence of automatic (implantable) cardiac defibrillator: Secondary | ICD-10-CM | POA: Insufficient documentation

## 2016-12-14 DIAGNOSIS — Z7982 Long term (current) use of aspirin: Secondary | ICD-10-CM | POA: Diagnosis not present

## 2016-12-14 DIAGNOSIS — J449 Chronic obstructive pulmonary disease, unspecified: Secondary | ICD-10-CM | POA: Insufficient documentation

## 2016-12-14 DIAGNOSIS — R079 Chest pain, unspecified: Secondary | ICD-10-CM | POA: Diagnosis present

## 2016-12-14 DIAGNOSIS — R002 Palpitations: Secondary | ICD-10-CM | POA: Insufficient documentation

## 2016-12-14 DIAGNOSIS — E119 Type 2 diabetes mellitus without complications: Secondary | ICD-10-CM | POA: Diagnosis not present

## 2016-12-14 NOTE — Discharge Instructions (Signed)
Make sure that you are eating and drinking well.  Continue taking your medications as prescribed.  See your doctor or return here if needed for problems.

## 2016-12-14 NOTE — ED Triage Notes (Addendum)
Pt brought in by [facility] with c/o left sided chest pain and overall warm feeling that happened suddenly today. Pt reports he does feel better now. EKG normal per EMS. Pt has hx of 4 heart attacks and a defibrillator.

## 2016-12-14 NOTE — ED Notes (Signed)
Received fax from [employer] concerning interrogation of ICD. Given to EDP.

## 2016-12-14 NOTE — ED Provider Notes (Signed)
[facility] EMERGENCY DEPARTMENT Provider Note   CSN: [id number] Arrival date & time: 12/14/16  1454     History   Chief Complaint Chief Complaint  Patient presents with  . Chest Pain    HPI Lance Nunez is a 66 y.o. male. Both were feeling of chest discomfort which he described as a warm feeling", which occurred for a period of time approximately 60 minutes earlier today.  There is no known provocation.  He does not feel like his defibrillator fired.  He denies associated chest pain, dizziness, diaphoresis, nausea, vomiting, weakness or dizziness.  He is taking his usual medications, as directed.  He is eating well.  There are no other known modifying factors.  HPI  Past Medical History:  Diagnosis Date  . COPD (chronic obstructive pulmonary disease) ([place])   . Coronary artery disease   . Diabetes mellitus without complication ([place])   . Hypertension   . Myocardial infarction [facility])    95' 97' 98'    Patient Active Problem List   Diagnosis Date Noted  . 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 Dilated cardiomyopathy ([place]) 09/10/2015  . Diabetes mellitus without complication ([place]) 53/61/4431  . HTN, goal below 140/80 09/10/2015    Past Surgical History:  Procedure Laterality Date  . Fractured leg Right   . IMPLANTABLE CARDIOVERTER DEFIBRILLATOR (ICD) GENERATOR CHANGE N/A 09/06/2015   Procedure: ICD GENERATOR CHANGE;  Surgeon: Isaias Cowman, MD;  Location: [facility] ORS;  Service: Cardiovascular;  Laterality: N/A;  . IMPLANTABLE CARDIOVERTER DEFIBRILLATOR (ICD) GENERATOR CHANGE Left 09/13/2015   Procedure: ICD GENERATOR CHANGE/REVISION;  Surgeon: Isaias Cowman, MD;  Location: [facility] ORS;  Service: Cardiovascular;  Laterality: Left;  . Stents N/A    4 cardiac stents       Home Medications    Prior to Admission medications   Medication Sig Start Date End Date Taking? Authorizing Provider   amiodarone (PACERONE) 200 MG tablet Take 1 tablet (200 mg total) by mouth 2 (two) times daily. Patient taking differently: Take 200 mg by mouth daily.  01/15/16  Yes Bettey Costa, MD  aspirin 81 MG tablet Take 81 mg by mouth daily.   Yes [provider]  carvedilol (COREG) 6.25 MG tablet Take 6.25 mg by mouth 2 (two) times daily with a meal.   Yes [provider]  gabapentin (NEURONTIN) 600 MG tablet Take 600 mg by mouth 3 (three) times daily.   Yes [provider]  gemfibrozil (LOPID) 600 MG tablet Take 600 mg by mouth daily.    Yes [provider]  glipiZIDE (GLUCOTROL) 10 MG tablet Take 10 mg by mouth daily before breakfast.   Yes [provider]  lovastatin (MEVACOR) 40 MG tablet Take 80 mg by mouth at bedtime.    Yes [provider]  metFORMIN (GLUCOPHAGE) 500 MG tablet Take 500 mg by mouth daily at 6 (six) AM.   Yes [provider]  quinapril (ACCUPRIL) 10 MG tablet Take 10 mg by mouth daily.   Yes [provider]  ranitidine (ZANTAC) 150 MG tablet Take 150 mg by mouth 2 (two) times daily as needed for heartburn.    Yes [provider]  spironolactone (ALDACTONE) 25 MG tablet Take 12.5 mg by mouth at bedtime.    Yes [provider]  sucralfate (CARAFATE) 1 g tablet Take 1 g by mouth 3 (three) times daily.   Yes [provider]  verapamil (CALAN-SR) 180 MG CR tablet  Take 180 mg by mouth at bedtime.   Yes [provider]  nitroGLYCERIN (NITROSTAT) 0.4 MG SL tablet Place 0.4 mg under the tongue every 5 (five) minutes as needed for chest pain.    [provider]    Family History No family history on file.  Social History Social History  Substance Use Topics  . Smoking status: Former Smoker    Quit date: 07/19/2004  . Smokeless tobacco: Never Used  . Alcohol use No     Allergies   Prednisone; Axid [nizatidine]; and Codeine   Review of Systems Review of Systems  All  other systems reviewed and are negative.    Physical Exam Updated Vital Signs BP (!) 171/90 (BP Location: Right Arm)   Pulse 60   Temp 97.8 F (36.6 C) (Oral)   Resp (!) 25   Ht 5\' 4"  (1.626 m)   Wt 99.8 kg (220 lb)   SpO2 97%   BMI 37.76 kg/m   Physical Exam  Constitutional: He is oriented to person, place, and time. He appears well-developed. No distress.  Somewhat overweight  HENT:  Head: Normocephalic and atraumatic.  Right Ear: External ear normal.  Left Ear: External ear normal.  Eyes: Pupils are equal, round, and reactive to light. Conjunctivae and EOM are normal.  Neck: Normal range of motion and phonation normal. Neck supple.  Cardiovascular: Normal rate, regular rhythm and normal heart sounds.   Pulmonary/Chest: Effort normal and breath sounds normal. He exhibits no bony tenderness.  Abdominal: Soft. There is no tenderness.  Musculoskeletal: Normal range of motion. He exhibits no edema or tenderness.  Neurological: He is alert and oriented to person, place, and time. No cranial nerve deficit or sensory deficit. He exhibits normal muscle tone. Coordination normal.  Skin: Skin is warm, dry and intact.  Psychiatric: He has a normal mood and affect. His behavior is normal. Judgment and thought content normal.  Nursing note and vitals reviewed.    ED Treatments / Results  Labs (all labs ordered are listed, but only abnormal results are displayed) Labs Reviewed - No data to display  EKG  EKG Interpretation  Date/Time:  Saturday December 14 2016 15:03:02 EDT Ventricular Rate:  68 PR Interval:    QRS Duration: 135 QT Interval:  435 QTC Calculation: 463 R Axis:   67 Text Interpretation:  Sinus rhythm Prolonged PR interval Nonspecific intraventricular conduction delay Nonspecific T abnormalities, lateral leads since last tracing no significant change Confirmed by Daleen Bo [phone]) on 12/14/2016 3:29:19 PM       Radiology No results  found.  Procedures Procedures (including critical care time)  Medications Ordered in ED Medications - No data to display   Initial Impression / Assessment and Plan / ED Course  I have reviewed the triage vital signs and the nursing notes.  Pertinent labs & imaging results that were available during my care of the patient were reviewed by me and considered in my medical decision making (see chart for details).  Clinical Course as of Dec 14 1857  Sat Dec 14, 2016  1857 [employer] defibrillator interrogated, no arrhythmias or shock discharges.  [EW]    Clinical Course User Index [EW] Daleen Bo, MD     Patient Vitals for the past 24 hrs:  BP Temp Temp src Pulse Resp SpO2 Height Weight  12/14/16 1856 (!) 171/90 - - 60 (!) 25 97 % - -  12/14/16 1847 (!) 171/90 - - 60 19 97 % - -  12/14/16  1800 - - - - 14 - - -  12/14/16 1730 (!) 168/91 - - 62 18 98 % - -  12/14/16 1700 (!) 158/92 - - - 19 - - -  12/14/16 1630 (!) 150/81 - - 63 - 96 % - -  12/14/16 1600 (!) 151/82 - - 62 - 94 % - -  12/14/16 1530 (!) 147/83 - - 65 20 96 % - -  12/14/16 1459 (!) 159/88 97.8 F (36.6 C) Oral 68 16 97 % - -  12/14/16 1458 - - - - - - 5\' 4"  (1.626 m) 99.8 kg (220 lb)    7:00 PM Reevaluation with update and discussion. After initial assessment and treatment, an updated evaluation reveals he remains comfortable.  Cardiac monitor without arrhythmias, while here.  He states he had another episode of the warm feeling, that was transient.  Findings discussed with patient, all questions were answered. Latravia [place] L       Final Clinical Impressions(s) / ED Diagnoses   Final diagnoses:  Palpitation   Patient presented with nonspecific chest complaints, possibly palpitations however the defibrillator was interrogated and there were no significant nature seen.  An evaluation for cardiac ischemia, metabolic ischemia and untreated blood pressure were all reassuring.  Doubt ACS, PE, pneumonia.  Nursing  Notes Reviewed/ Care Coordinated Applicable Imaging Reviewed Interpretation of Laboratory Data incorporated into ED treatment  The patient appears reasonably screened and/or stabilized for discharge and I doubt any other medical condition or other [facility] requiring further screening, evaluation, or treatment in the ED at this time prior to discharge.  Plan: Home Medications-continue current medications; Home Treatments-rest, fluids, 3 meals a day; return here if the recommended treatment, does not improve the symptoms; Recommended follow up-PCP, as needed   New Prescriptions New Prescriptions   No medications on file     Daleen Bo, MD 12/14/16 1911

## 2016-12-17 ENCOUNTER — Ambulatory Visit: Payer: Medicare Other | Attending: Neurology

## 2016-12-17 DIAGNOSIS — G4730 Sleep apnea, unspecified: Secondary | ICD-10-CM | POA: Insufficient documentation

## 2017-04-04 IMAGING — DX DG CHEST 1V PORT
1 series · 1 of 1 positions shown · non-contrast
Comparison: 08/29/2015 and prior chest radiographs

CLINICAL DATA: 65-year-old male with defibrillator malfunction.

EXAM:
PORTABLE CHEST 1 VIEW

[chest ap]
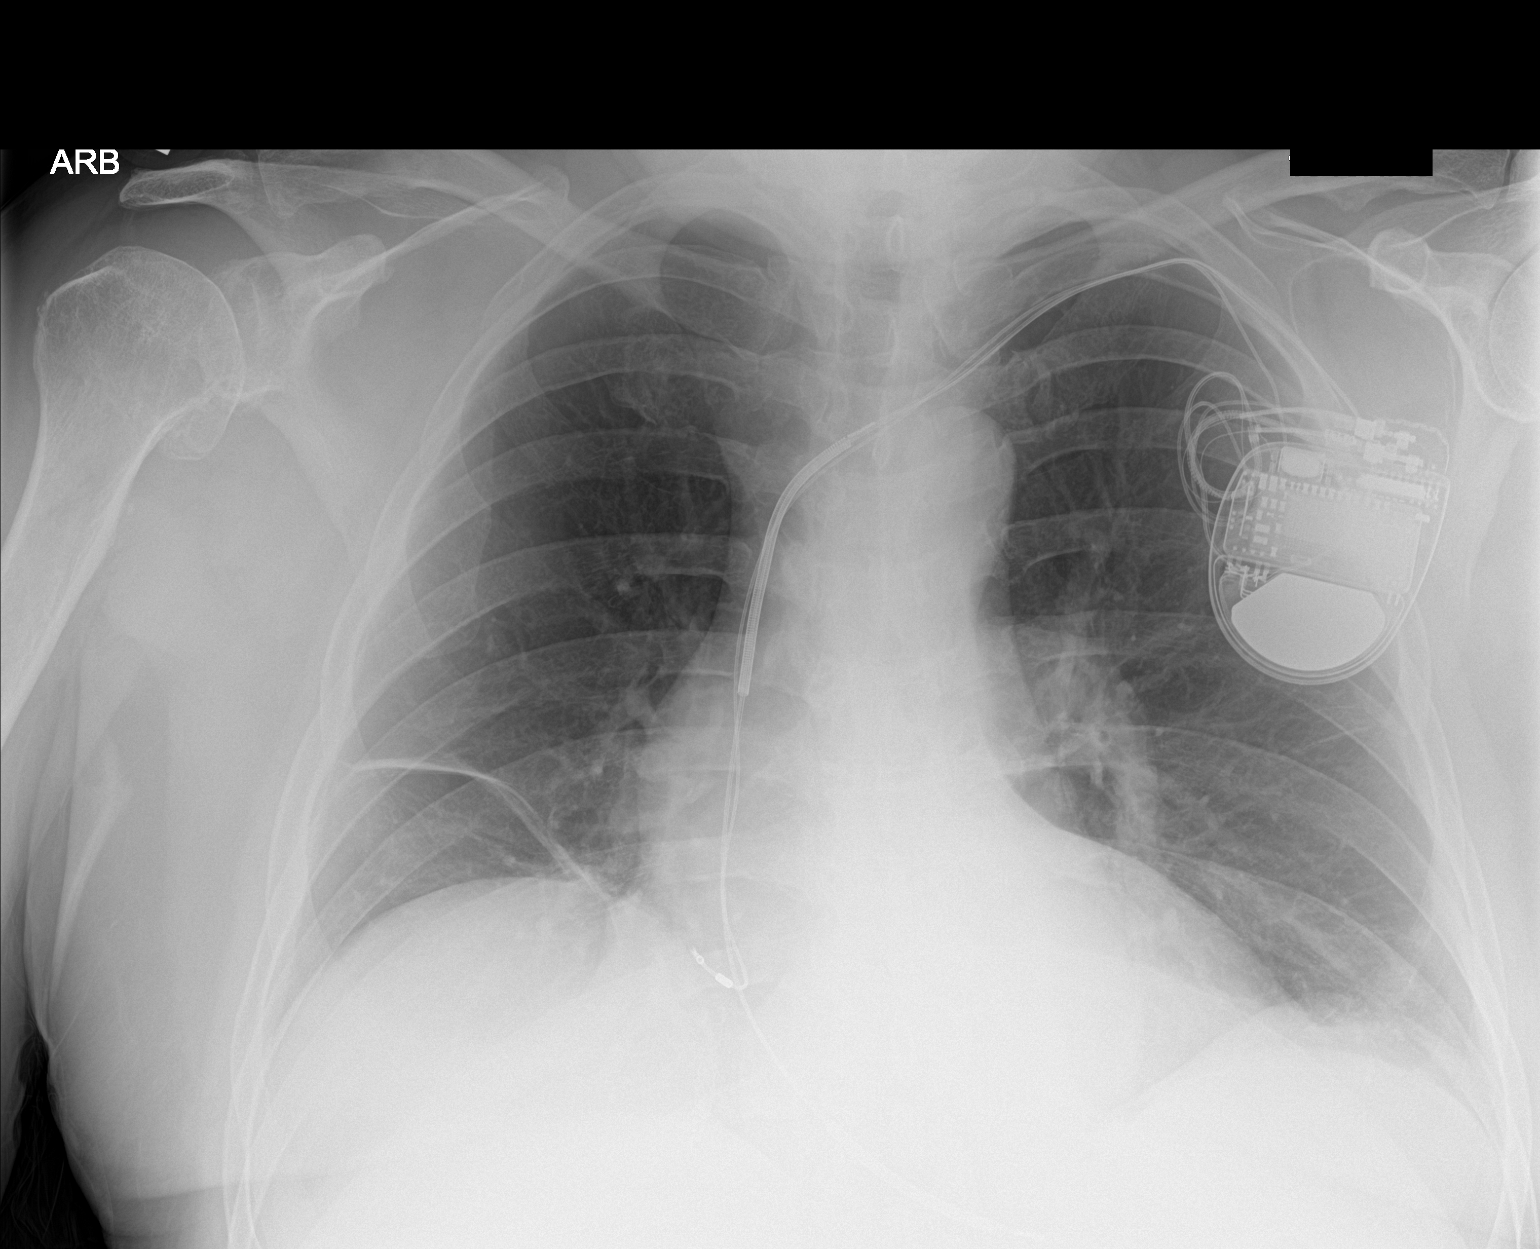

[1 of 1 positions shown; findings below may reference images not displayed]

FINDINGS: Heart size is upper limits of normal.

Left dual chamber ICD is unchanged without wire fracture. The leads
overlie the regions of the right atrium right ventricle.

Mild right basilar scarring is again noted.

There is no evidence of pleural effusion or pneumothorax.
IMPRESSION: Unchanged appearance of left ICD without definite complicating
features. No definite evidence of lead fracture.

## 2017-04-29 ENCOUNTER — Other Ambulatory Visit: Payer: Self-pay

## 2017-04-29 ENCOUNTER — Encounter: Payer: Self-pay | Admitting: Emergency Medicine

## 2017-04-29 ENCOUNTER — Emergency Department
Admission: EM | Admit: 2017-04-29 | Discharge: 2017-04-29 | Disposition: A | Discharge status: Home / Self Care | Payer: Medicare Other | Attending: Emergency Medicine | Admitting: Emergency Medicine

## 2017-04-29 DIAGNOSIS — Z7982 Long term (current) use of aspirin: Secondary | ICD-10-CM | POA: Diagnosis not present

## 2017-04-29 DIAGNOSIS — Z87891 Personal history of nicotine dependence: Secondary | ICD-10-CM | POA: Diagnosis not present

## 2017-04-29 DIAGNOSIS — I252 Old myocardial infarction: Secondary | ICD-10-CM | POA: Insufficient documentation

## 2017-04-29 DIAGNOSIS — Z95810 Presence of automatic (implantable) cardiac defibrillator: Secondary | ICD-10-CM | POA: Diagnosis not present

## 2017-04-29 DIAGNOSIS — E1159 Type 2 diabetes mellitus with other circulatory complications: Secondary | ICD-10-CM | POA: Insufficient documentation

## 2017-04-29 DIAGNOSIS — W010XXA Fall on same level from slipping, tripping and stumbling without subsequent striking against object, initial encounter: Secondary | ICD-10-CM | POA: Diagnosis not present

## 2017-04-29 DIAGNOSIS — W19XXXA Unspecified fall, initial encounter: Secondary | ICD-10-CM

## 2017-04-29 DIAGNOSIS — R55 Syncope and collapse: Secondary | ICD-10-CM | POA: Insufficient documentation

## 2017-04-29 DIAGNOSIS — I2510 Atherosclerotic heart disease of native coronary artery without angina pectoris: Secondary | ICD-10-CM | POA: Insufficient documentation

## 2017-04-29 DIAGNOSIS — J449 Chronic obstructive pulmonary disease, unspecified: Secondary | ICD-10-CM | POA: Diagnosis not present

## 2017-04-29 DIAGNOSIS — R42 Dizziness and giddiness: Secondary | ICD-10-CM

## 2017-04-29 DIAGNOSIS — I10 Essential (primary) hypertension: Secondary | ICD-10-CM | POA: Insufficient documentation

## 2017-04-29 DIAGNOSIS — Z7984 Long term (current) use of oral hypoglycemic drugs: Secondary | ICD-10-CM | POA: Insufficient documentation

## 2017-04-29 DIAGNOSIS — Z79899 Other long term (current) drug therapy: Secondary | ICD-10-CM | POA: Insufficient documentation

## 2017-04-29 LAB — CBC
HEMATOCRIT: 34.8 % — AB (ref 40.0–52.0)
HEMOGLOBIN: 11.2 g/dL — AB (ref 13.0–18.0)
MCH: 27.9 pg (ref 26.0–34.0)
MCHC: 32.3 g/dL (ref 32.0–36.0)
MCV: 86.5 fL (ref 80.0–100.0)
Platelets: 191 10*3/uL (ref 150–440)
RBC: 4.02 MIL/uL — AB (ref 4.40–5.90)
RDW: 15.8 % — ABNORMAL HIGH (ref 11.5–14.5)
WBC: 6.5 10*3/uL (ref 3.8–10.6)

## 2017-04-29 LAB — URINALYSIS, COMPLETE (UACMP) WITH MICROSCOPIC
BACTERIA UA: NONE SEEN
BILIRUBIN URINE: NEGATIVE
Glucose, UA: NEGATIVE mg/dL
Hgb urine dipstick: NEGATIVE
KETONES UR: NEGATIVE mg/dL
Leukocytes, UA: NEGATIVE
NITRITE: NEGATIVE
PROTEIN: 30 mg/dL — AB
SPECIFIC GRAVITY, URINE: 1.014 (ref 1.005–1.030)
pH: 6 (ref 5.0–8.0)

## 2017-04-29 LAB — BASIC METABOLIC PANEL
ANION GAP: 8 (ref 5–15)
BUN: 34 mg/dL — ABNORMAL HIGH (ref 6–20)
CO2: 25 mmol/L (ref 22–32)
Calcium: 8.6 mg/dL — ABNORMAL LOW (ref 8.9–10.3)
Chloride: 105 mmol/L (ref 101–111)
Creatinine, Ser: 1.76 mg/dL — ABNORMAL HIGH (ref 0.61–1.24)
GFR calc Af Amer: 45 mL/min — ABNORMAL LOW (ref >60)
GFR, EST NON AFRICAN AMERICAN: 39 mL/min — AB (ref >60)
GLUCOSE: 115 mg/dL — AB (ref 65–99)
POTASSIUM: 3.9 mmol/L (ref 3.5–5.1)
Sodium: 138 mmol/L (ref 135–145)

## 2017-04-29 LAB — TROPONIN I

## 2017-04-29 MED ORDER — MECLIZINE HCL 25 MG PO TABS
25.0000 mg | ORAL_TABLET | Freq: Once | ORAL | Status: AC
Start: 2017-04-29 — End: 2017-04-29
  Administered 2017-04-29: 25 mg via ORAL
  Filled 2017-04-29: qty 1

## 2017-04-29 MED ORDER — SODIUM CHLORIDE 0.9 % IV SOLN
1000.0000 mL | Freq: Once | INTRAVENOUS | Status: AC
  Administered 2017-04-29: 1000 mL via INTRAVENOUS

## 2017-04-29 MED ORDER — MECLIZINE HCL 25 MG PO TABS: 25.0000 mg | ORAL_TABLET | Freq: Three times a day (TID) | ORAL | 0 refills | Status: DC | PRN

## 2017-04-29 NOTE — ED Triage Notes (Signed)
Pt in via [facility] from home, reports getting up this morning to go to bathroom, becoming dizzy to the point of falling.  Pt denies LOC, denies hitting head, denies blood thinners.  Pt with no complaints of pain at this time.  Pt A/Ox3, speech repetitive.  Vitals WDL, NAD noted at this time.

## 2017-04-29 NOTE — ED Notes (Signed)
Pt ambulatory to wheel chair upon discharge; currently denying any dizziness to this RN. Verbalized understanding of discharge instructions, follow-up care and prescription. VSS. Skin warm and dry. A&O x4.

## 2017-04-29 NOTE — ED Provider Notes (Signed)
[facility] Emergency Department Provider Note   ____________________________________________    I have reviewed the triage vital signs and the nursing notes.   HISTORY  Chief Complaint Fall     HPI Lance Nunez is a 67 y.o. male presents after a near syncopal event with fall.  Patient reports this morning when he was getting out of bed he stood up and felt "swimmy headed" and fell down to the floor.  He denies injury.  No extremity pain, no hip pelvic or back pain.  No neck pain.  Not on blood thinners.  Feels better currently.  No palpitations or chest pain or shortness of breath.  Does have a history of diabetes and coronary artery disease.  This has happened to him several times in the past   Past Medical History:  Diagnosis Date  . COPD (chronic obstructive pulmonary disease) ([place])   . Coronary artery disease   . Diabetes mellitus without complication ([place])   . Hypertension   . Myocardial infarction [facility])    95' 97' 98'    Patient Active Problem List   Diagnosis Date Noted  . 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 Dilated cardiomyopathy ([place]) 09/10/2015  . Diabetes mellitus without complication ([place]) 17/40/8144  . HTN, goal below 140/80 09/10/2015    Past Surgical History:  Procedure Laterality Date  . Fractured leg Right   . IMPLANTABLE CARDIOVERTER DEFIBRILLATOR (ICD) GENERATOR CHANGE N/A 09/06/2015   Procedure: ICD GENERATOR CHANGE;  Surgeon: Isaias Cowman, MD;  Location: [facility] ORS;  Service: Cardiovascular;  Laterality: N/A;  . IMPLANTABLE CARDIOVERTER DEFIBRILLATOR (ICD) GENERATOR CHANGE Left 09/13/2015   Procedure: ICD GENERATOR CHANGE/REVISION;  Surgeon: Isaias Cowman, MD;  Location: [facility] ORS;  Service: Cardiovascular;  Laterality: Left;  . Stents N/A    4 cardiac stents    Prior to Admission medications   Medication Sig Start Date End Date  Taking? Authorizing Provider  amiodarone (PACERONE) 200 MG tablet Take 1 tablet (200 mg total) by mouth 2 (two) times daily. Patient taking differently: Take 200 mg by mouth daily.  01/15/16   Bettey Costa, MD  aspirin 81 MG tablet Take 81 mg by mouth daily.    [provider]  carvedilol (COREG) 6.25 MG tablet Take 6.25 mg by mouth 2 (two) times daily with a meal.    [provider]  gabapentin (NEURONTIN) 600 MG tablet Take 600 mg by mouth 3 (three) times daily.    [provider]  gemfibrozil (LOPID) 600 MG tablet Take 600 mg by mouth daily.     [provider]  glipiZIDE (GLUCOTROL) 10 MG tablet Take 10 mg by mouth daily before breakfast.    [provider]  lovastatin (MEVACOR) 40 MG tablet Take 80 mg by mouth at bedtime.     [provider]  meclizine (ANTIVERT) 25 MG tablet Take 1 tablet (25 mg total) by mouth 3 (three) times daily as needed for dizziness. 04/29/17   Lavonia Drafts, MD  metFORMIN (GLUCOPHAGE) 500 MG tablet Take 500 mg by mouth daily at 6 (six) AM.    [provider]  nitroGLYCERIN (NITROSTAT) 0.4 MG SL tablet Place 0.4 mg under the tongue every 5 (five) minutes as needed for chest pain.    [provider]  quinapril (ACCUPRIL) 10 MG tablet Take 10 mg by mouth daily.    [provider]  ranitidine (ZANTAC) 150 MG tablet Take 150 mg by mouth  2 (two) times daily as needed for heartburn.     [provider]  spironolactone (ALDACTONE) 25 MG tablet Take 12.5 mg by mouth at bedtime.     [provider]  sucralfate (CARAFATE) 1 g tablet Take 1 g by mouth 3 (three) times daily.    [provider]  verapamil (CALAN-SR) 180 MG CR tablet Take 180 mg by mouth at bedtime.    [provider]     Allergies Prednisone; Axid [nizatidine]; and Codeine  No family history on file.  Social History Social History   Tobacco Use  . Smoking status: Former Smoker    Last  attempt to quit: 07/19/2004    Years since quitting: 12.7  . Smokeless tobacco: Never Used  Substance Use Topics  . Alcohol use: No  . Drug use: No    Review of Systems  Constitutional: No fevers Eyes: No visual changes.  ENT: No neck pain Cardiovascular: no Palpitations Respiratory: Denies shortness of breath. Gastrointestinal: No abdominal pain.  Genitourinary: No groin injury Musculoskeletal: Negative for back pain.  No extremity pain Skin: Negative for abrasion or laceration Neurological: Negative for headaches, no deficits   ____________________________________________   PHYSICAL EXAM:  VITAL SIGNS: ED Triage Vitals  Enc Vitals Group     BP 04/29/17 0737 (!) 151/88     Pulse Rate 04/29/17 0737 62     Resp 04/29/17 0737 17     Temp 04/29/17 0737 97.7 F (36.5 C)     Temp Source 04/29/17 0737 Oral     SpO2 04/29/17 0737 93 %     Weight 04/29/17 0738 99.8 kg (220 lb)     Height 04/29/17 0738 1.626 m (5\' 4" )     Head Circumference --      Peak Flow --      Pain Score --      Pain Loc --      Pain Edu? --      Excl. in [place]? --     Constitutional: Alert and oriented. No acute distress. Pleasant and interactive Eyes: Conjunctivae are normal.  Head: Atraumatic. Nose: No congestion/rhinnorhea. Mouth/Throat: Mucous membranes are dry Neck:  Painless ROM, no vertebral tenderness to palpation Cardiovascular: Normal rate, regular rhythm. Grossly normal heart sounds.  Good peripheral circulation. Respiratory: Normal respiratory effort.  No retractions. Lungs CTAB. Gastrointestinal: Soft and nontender. No distention.   Genitourinary: deferred Musculoskeletal: Back: No vertebral tenderness palpation, normal range of movement without pain in all extremities, no pain with axial load of both hips.  No clavicle tenderness. warm and well perfused remedies Neurologic:  Normal speech and language. No gross focal neurologic deficits are appreciated.  Cranial nerves II through XII  are normal Skin:  Skin is warm, dry and intact. No rash noted. Psychiatric: Mood and affect are normal. Speech and behavior are normal.  ____________________________________________   LABS (all labs ordered are listed, but only abnormal results are displayed)  Labs Reviewed  BASIC METABOLIC PANEL - Abnormal; Notable for the following components:      Result Value   Glucose, Bld 115 (*)    BUN 34 (*)    Creatinine, Ser 1.76 (*)    Calcium 8.6 (*)    GFR calc non Af Amer 39 (*)    GFR calc Af Amer 45 (*)    All other components within normal limits  CBC - Abnormal; Notable for the following components:   RBC 4.02 (*)    Hemoglobin 11.2 (*)  HCT 34.8 (*)    RDW 15.8 (*)    All other components within normal limits  URINALYSIS, COMPLETE (UACMP) WITH MICROSCOPIC - Abnormal; Notable for the following components:   Color, Urine STRAW (*)    APPearance CLEAR (*)    Protein, ur 30 (*)    Squamous Epithelial / LPF 0-5 (*)    All other components within normal limits  TROPONIN I   ____________________________________________  EKG  ED ECG REPORT I, Lavonia Drafts, the attending physician, personally viewed and interpreted this ECG.  Date: 04/29/2017  Rhythm: normal sinus rhythm QRS Axis: normal Intervals: Left bundle branch block ST/T Wave abnormalities: normal   ____________________________________________  RADIOLOGY  None ____________________________________________   PROCEDURES  Procedure(s) performed: No  Procedures   Critical Care performed: No ____________________________________________   INITIAL IMPRESSION / ASSESSMENT AND PLAN / ED COURSE  Pertinent labs & imaging results that were available during my care of the patient were reviewed by me and considered in my medical decision making (see chart for details).  Patient well-appearing in no acute distress.  Vital signs are unremarkable.  No evidence of injury on thorough exam, patient denies any  pain.  We will check labs give IV fluids placed on a cardiac monitor and reevaluate.  Lab work overall reassuring, mild dehydration, creatinine is near baseline.  Orthostatics unremarkable, patient still has some vertiginous type symptoms when he stands but is feeling better.  Treated with meclizine.  Offered admission for further evaluation and workup of the patient declined would like to go home with meclizine, he will return if any worsening of symptoms.    ____________________________________________   FINAL CLINICAL IMPRESSION(S) / ED DIAGNOSES  Final diagnoses:  Vertigo  Fall, initial encounter        Note:  This document was prepared using Dragon voice recognition software and may include unintentional dictation errors.    Lavonia Drafts, MD 04/29/17 1150

## 2017-04-29 NOTE — ED Notes (Signed)
Pt with dizziness upon standing for orthostatic vitals, balance unsteady due to dizziness.

## 2017-04-30 ENCOUNTER — Emergency Department: Payer: Medicare Other

## 2017-04-30 ENCOUNTER — Emergency Department
Admission: EM | Admit: 2017-04-30 | Discharge: 2017-04-30 | Disposition: A | Discharge status: Home / Self Care | Payer: Medicare Other | Attending: Emergency Medicine | Admitting: Emergency Medicine

## 2017-04-30 ENCOUNTER — Encounter: Payer: Self-pay | Admitting: Emergency Medicine

## 2017-04-30 ENCOUNTER — Other Ambulatory Visit: Payer: Self-pay

## 2017-04-30 DIAGNOSIS — Z95810 Presence of automatic (implantable) cardiac defibrillator: Secondary | ICD-10-CM | POA: Diagnosis not present

## 2017-04-30 DIAGNOSIS — Z87891 Personal history of nicotine dependence: Secondary | ICD-10-CM | POA: Diagnosis not present

## 2017-04-30 DIAGNOSIS — R42 Dizziness and giddiness: Secondary | ICD-10-CM | POA: Insufficient documentation

## 2017-04-30 DIAGNOSIS — Z7984 Long term (current) use of oral hypoglycemic drugs: Secondary | ICD-10-CM | POA: Diagnosis not present

## 2017-04-30 DIAGNOSIS — J449 Chronic obstructive pulmonary disease, unspecified: Secondary | ICD-10-CM | POA: Diagnosis not present

## 2017-04-30 DIAGNOSIS — E119 Type 2 diabetes mellitus without complications: Secondary | ICD-10-CM | POA: Insufficient documentation

## 2017-04-30 DIAGNOSIS — H8149 Vertigo of central origin, unspecified ear: Secondary | ICD-10-CM | POA: Insufficient documentation

## 2017-04-30 DIAGNOSIS — Z79899 Other long term (current) drug therapy: Secondary | ICD-10-CM | POA: Diagnosis not present

## 2017-04-30 DIAGNOSIS — Z7982 Long term (current) use of aspirin: Secondary | ICD-10-CM | POA: Diagnosis not present

## 2017-04-30 DIAGNOSIS — I10 Essential (primary) hypertension: Secondary | ICD-10-CM | POA: Insufficient documentation

## 2017-04-30 DIAGNOSIS — I2510 Atherosclerotic heart disease of native coronary artery without angina pectoris: Secondary | ICD-10-CM | POA: Insufficient documentation

## 2017-04-30 MED ORDER — MECLIZINE HCL 25 MG PO TABS: 25.0000 mg | ORAL_TABLET | Freq: Three times a day (TID) | ORAL | 0 refills | Status: DC | PRN

## 2017-04-30 MED ORDER — MECLIZINE HCL 25 MG PO TABS
25.0000 mg | ORAL_TABLET | Freq: Once | ORAL | Status: AC
  Administered 2017-04-30: 25 mg via ORAL
  Filled 2017-04-30: qty 1

## 2017-04-30 NOTE — Discharge Instructions (Signed)
You are evaluated after a fall and dizziness which I do suspect is due to vertigo.  As we discussed, your prescription is refilled.  Be very careful when changing from lying to sitting to standing or any change of position to prevent falling.  If your symptoms are not improved after 1 week, you should see the specialist ENT, number provided for Dr. Reola Mosher office.  Return to emerge department immediately for any worsening condition including palpitations, chest pain, nausea, sweats, dizziness or passing out, weakness, numbness, or any other symptoms concerning to you.

## 2017-04-30 NOTE — ED Notes (Signed)
Reviewed pt's c/o and earlier visit with Dr Owens Shark; order obtained for head CT only at this time; pt voices good understanding of plan of care

## 2017-04-30 NOTE — ED Triage Notes (Signed)
Pt to triage via w/c with no distress noted, brought in by EMS; reports being seen yesterday, dx with vertigo; st fell getting OOB but denies any injury; took meclizine at 10pm but dizziness persists; denies any accomp symptoms

## 2017-04-30 NOTE — ED Provider Notes (Addendum)
[facility] Emergency Department Provider Note ____________________________________________   I have reviewed the triage vital signs and the triage nursing note.  HISTORY  Chief Complaint Dizziness   Historian Patient and daughter  HPI Lance Nunez is a 67 y.o. male seen yesterday for fall and diagnosed with vertigo, presents today after getting out of bed this morning and feeling the room spinning and then also falling.  Not sure if he struck his head.  States he was prescribed meclizine yesterday, but is not sure where he placed the medications.  Denies any chest pain or palpitations, focal weakness or numbness.  No vomiting or diarrhea or other fluid losses.  Symptoms mild to moderate.  No neck pain or injury.   Past Medical History:  Diagnosis Date  . COPD (chronic obstructive pulmonary disease) ([place])   . Coronary artery disease   . Diabetes mellitus without complication ([place])   . Hypertension   . Myocardial infarction [facility])    95' 97' 98'    Patient Active Problem List   Diagnosis Date Noted  . 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 Dilated cardiomyopathy ([place]) 09/10/2015  . Diabetes mellitus without complication ([place]) 01/60/1093  . HTN, goal below 140/80 09/10/2015    Past Surgical History:  Procedure Laterality Date  . Fractured leg Right   . IMPLANTABLE CARDIOVERTER DEFIBRILLATOR (ICD) GENERATOR CHANGE N/A 09/06/2015   Procedure: ICD GENERATOR CHANGE;  Surgeon: Isaias Cowman, MD;  Location: [facility] ORS;  Service: Cardiovascular;  Laterality: N/A;  . IMPLANTABLE CARDIOVERTER DEFIBRILLATOR (ICD) GENERATOR CHANGE Left 09/13/2015   Procedure: ICD GENERATOR CHANGE/REVISION;  Surgeon: Isaias Cowman, MD;  Location: [facility] ORS;  Service: Cardiovascular;  Laterality: Left;  . Stents N/A    4 cardiac stents    Prior to Admission medications   Medication Sig  Start Date End Date Taking? Authorizing Provider  amiodarone (PACERONE) 200 MG tablet Take 1 tablet (200 mg total) by mouth 2 (two) times daily. Patient taking differently: Take 200 mg by mouth daily.  01/15/16   Bettey Costa, MD  aspirin 81 MG tablet Take 81 mg by mouth daily.    [provider]  carvedilol (COREG) 6.25 MG tablet Take 6.25 mg by mouth 2 (two) times daily with a meal.    [provider]  gabapentin (NEURONTIN) 600 MG tablet Take 600 mg by mouth 3 (three) times daily.    [provider]  gemfibrozil (LOPID) 600 MG tablet Take 600 mg by mouth daily.     [provider]  glipiZIDE (GLUCOTROL) 10 MG tablet Take 10 mg by mouth daily before breakfast.    [provider]  lovastatin (MEVACOR) 40 MG tablet Take 80 mg by mouth at bedtime.     [provider]  meclizine (ANTIVERT) 25 MG tablet Take 1 tablet (25 mg total) by mouth 3 (three) times daily as needed for dizziness. 04/30/17   Lisa Roca, MD  metFORMIN (GLUCOPHAGE) 500 MG tablet Take 500 mg by mouth daily at 6 (six) AM.    [provider]  nitroGLYCERIN (NITROSTAT) 0.4 MG SL tablet Place 0.4 mg under the tongue every 5 (five) minutes as needed for chest pain.    [provider]  quinapril (ACCUPRIL) 10 MG tablet Take 10 mg by mouth daily.    [provider]  ranitidine (ZANTAC) 150 MG tablet Take 150 mg by mouth 2 (two) times daily as needed for heartburn.     [provider]  spironolactone (ALDACTONE) 25 MG tablet Take 12.5 mg by mouth at bedtime.     [provider]  sucralfate (CARAFATE) 1 g tablet Take 1 g by mouth 3 (three) times daily.    [provider]  verapamil (CALAN-SR) 180 MG CR tablet Take 180 mg by mouth at bedtime.    [provider]    Allergies  Allergen Reactions  . Prednisone Other (See Comments)    Pt states, "I just cant take it, I dont know"  . Axid [Nizatidine]   . Codeine Itching     No family history on file.  Social History Social History   Tobacco Use  . Smoking status: Former Smoker    Last attempt to quit: 07/19/2004    Years since quitting: 12.7  . Smokeless tobacco: Never Used  Substance Use Topics  . Alcohol use: No  . Drug use: No    Review of Systems  Constitutional: Negative for any recent illnesses. Eyes: Negative for visual changes. ENT: Negative for sore throat. Cardiovascular: Negative for chest pain. Respiratory: Negative for shortness of breath. Gastrointestinal: Negative for abdominal pain, vomiting and diarrhea. Genitourinary: Negative for dysuria. Musculoskeletal: Negative for back pain. Skin: Negative for rash. Neurological: Negative for headache.  Positive for dizzy or spinning especially when he gets up in the morning.  ____________________________________________   PHYSICAL EXAM:  VITAL SIGNS: ED Triage Vitals  Enc Vitals Group     BP 04/30/17 0406 108/81     Pulse Rate 04/30/17 0406 (!) 57     Resp 04/30/17 0406 18     Temp 04/30/17 0406 98 F (36.7 C)     Temp src --      SpO2 04/30/17 0406 100 %     Weight 04/30/17 0406 220 lb (99.8 kg)     Height 04/30/17 0406 5\' 4"  (1.626 m)     Head Circumference --      Peak Flow --      Pain Score 04/30/17 0727 0     Pain Loc --      Pain Edu? --      Excl. in [place]? --      Constitutional: Alert and oriented. Well appearing and in no distress. HEENT   Head: Normocephalic and atraumatic.      Eyes: Conjunctivae are normal. Pupils equal and round.       Ears:         Nose: No congestion/rhinnorhea.   Mouth/Throat: Mucous membranes are moist.   Neck: No stridor.  Nontender C-spine posteriorly to palpation and range of motion Cardiovascular/Chest: Normal rate, regular rhythm.  No murmurs, rubs, or gallops. Respiratory: Normal respiratory effort without tachypnea nor retractions. Breath sounds are clear and equal bilaterally. No  wheezes/rales/rhonchi. Gastrointestinal: Soft. No distention, no guarding, no rebound. Nontender.    Genitourinary/rectal: Nontender.  Brown stool, Hemoccult negative Musculoskeletal: Nontender with normal range of motion in all extremities. No joint effusions.  No lower extremity tenderness.  No edema. Neurologic: No facial droop.  Finger-nose intact bilaterally.  Normal speech and language. No gross or focal neurologic deficits are appreciated. Skin:  Skin is warm, dry and intact. No rash noted. Psychiatric: Mood and affect are normal. Speech and behavior are normal. Patient exhibits appropriate insight and judgment.   ____________________________________________  LABS (pertinent positives/negatives) I, Lisa Roca, MD the attending physician have reviewed the labs noted below.  Labs Reviewed - No data to display  ____________________________________________    EKG Courtney Paris  Rhylynn Perdomo, MD, the attending physician have personally viewed and interpreted all ECGs.  61 bpm.  Normal sinus rhythm with first-degree AV block.  Nonspecific intraventricular conduction delay.  Nonspecific ST and T wave.  Normal axis. ____________________________________________  RADIOLOGY   CT head radiologist report: FINDINGS: Brain: No mass lesion, intraparenchymal hemorrhage or extra-axial collection. No evidence of acute cortical infarct. Unchanged atrophy pattern.  Vascular: No hyperdense vessel or unexpected vascular calcification.  Skull: Normal visualized skull base, calvarium and extracranial soft tissues.  Sinuses/Orbits: No sinus fluid levels or advanced mucosal thickening. No mastoid effusion. Normal orbits.  IMPRESSION: Unchanged atrophy without acute intracranial abnormality.  __________________________________________  PROCEDURES  Procedure(s) performed: None  Critical Care performed: None   ____________________________________________  ED COURSE / ASSESSMENT AND  PLAN  Pertinent labs & imaging results that were available during my care of the patient were reviewed by me and considered in my medical decision making (see chart for details).   Patient was evaluated yesterday for same episode, patient states that he lost his meclizine pills that worked yesterday.  In any case he thinks he struck his head, CT scan was done today and is reassuring for no traumatic injury and again no other acute intracranial findings.  He had no high risk red flag features in terms of cardiac etiology of the fall.  It certainly sounds like vertigo.  He again states that he feels somewhat better and does not need to stay in the hospital, and is happy to go home.  We discussed to be significantly careful especially in the mornings and especially with changing position and we will go ahead and rewrite the prescription for meclizine.  I did not repeat laboratory studies today, as yesterday he had the exact same symptoms, and he had laboratory studies which showed chronic similar to prior known renal dysfunction.  Normal troponin.  He had anemia to 11, prior was around 13.  Patient denies any black or bloody stools.  I did go ahead and Hemoccult check and this was brown stool which was negative.  He did not want to do repeat blood testing today and I think this is reasonable without symptoms and negative Hemoccult testing.  DIFFERENTIAL DIAGNOSIS: Including but not limited to bradycardia, tachycardia, orthostatic hypotension, vertigo, stroke, etc.  CONSULTATIONS:   None   Patient / Family / Caregiver informed of clinical course, medical decision-making process, and agree with plan.   I discussed return precautions, follow-up instructions, and discharge instructions with patient and/or family.  Discharge Instructions : You are evaluated after a fall and dizziness which I do suspect is due to vertigo.  As we discussed, your prescription is refilled.  Be very careful when  changing from lying to sitting to standing or any change of position to prevent falling.  If your symptoms are not improved after 1 week, you should see the specialist ENT, number provided for Dr. Reola Mosher office.  Return to emerge department immediately for any worsening condition including palpitations, chest pain, nausea, sweats, dizziness or passing out, weakness, numbness, or any other symptoms concerning to you.    ___________________________________________   FINAL CLINICAL IMPRESSION(S) / ED DIAGNOSES   Final diagnoses:  Vertigo      ___________________________________________        Note: This dictation was prepared with Dragon dictation. Any transcriptional errors that result from this process are unintentional    Lisa Roca, MD 04/30/17 7824    Lisa Roca, MD 04/30/17 [phone]

## 2017-04-30 NOTE — ED Notes (Signed)
Pt ambulatory at departure, VSS, pt in NAD. Pt verbalizes d/c teaching and follow up

## 2017-05-06 ENCOUNTER — Other Ambulatory Visit: Payer: Self-pay

## 2017-05-06 ENCOUNTER — Emergency Department: Payer: Medicare Other

## 2017-05-06 ENCOUNTER — Emergency Department
Admission: EM | Admit: 2017-05-06 | Discharge: 2017-05-06 | Disposition: A | Discharge status: Home / Self Care | Payer: Medicare Other | Attending: Emergency Medicine | Admitting: Emergency Medicine

## 2017-05-06 DIAGNOSIS — T82198A Other mechanical complication of other cardiac electronic device, initial encounter: Secondary | ICD-10-CM | POA: Insufficient documentation

## 2017-05-06 DIAGNOSIS — R079 Chest pain, unspecified: Secondary | ICD-10-CM | POA: Diagnosis present

## 2017-05-06 DIAGNOSIS — Y713 Surgical instruments, materials and cardiovascular devices (including sutures) associated with adverse incidents: Secondary | ICD-10-CM | POA: Diagnosis not present

## 2017-05-06 DIAGNOSIS — Z4502 Encounter for adjustment and management of automatic implantable cardiac defibrillator: Secondary | ICD-10-CM

## 2017-05-06 LAB — CBC
HCT: 35.1 % — ABNORMAL LOW (ref 40.0–52.0)
Hemoglobin: 11.5 g/dL — ABNORMAL LOW (ref 13.0–18.0)
MCH: 28.1 pg (ref 26.0–34.0)
MCHC: 32.7 g/dL (ref 32.0–36.0)
MCV: 86.1 fL (ref 80.0–100.0)
PLATELETS: 243 10*3/uL (ref 150–440)
RBC: 4.08 MIL/uL — ABNORMAL LOW (ref 4.40–5.90)
RDW: 15.7 % — AB (ref 11.5–14.5)
WBC: 7.1 10*3/uL (ref 3.8–10.6)

## 2017-05-06 LAB — COMPREHENSIVE METABOLIC PANEL
ALBUMIN: 3.5 g/dL (ref 3.5–5.0)
ALT: 36 U/L (ref 17–63)
ANION GAP: 8 (ref 5–15)
AST: 34 U/L (ref 15–41)
Alkaline Phosphatase: 98 U/L (ref 38–126)
BUN: 21 mg/dL — ABNORMAL HIGH (ref 6–20)
CO2: 25 mmol/L (ref 22–32)
Calcium: 8.1 mg/dL — ABNORMAL LOW (ref 8.9–10.3)
Chloride: 103 mmol/L (ref 101–111)
Creatinine, Ser: 1.52 mg/dL — ABNORMAL HIGH (ref 0.61–1.24)
GFR calc Af Amer: 53 mL/min — ABNORMAL LOW (ref >60)
GFR calc non Af Amer: 46 mL/min — ABNORMAL LOW (ref >60)
Glucose, Bld: 104 mg/dL — ABNORMAL HIGH (ref 65–99)
POTASSIUM: 3.9 mmol/L (ref 3.5–5.1)
SODIUM: 136 mmol/L (ref 135–145)
Total Bilirubin: 0.2 mg/dL — ABNORMAL LOW (ref 0.3–1.2)
Total Protein: 6.9 g/dL (ref 6.5–8.1)

## 2017-05-06 LAB — TROPONIN I: Troponin I: <0.03 ng/mL (ref <0.03)

## 2017-05-06 NOTE — ED Notes (Signed)
Reviewed discharge instructions and follow-up care with patient. Patient verbalized understanding of all information reviewed. Patient stable, with no distress noted at this time.    

## 2017-05-06 NOTE — ED Provider Notes (Signed)
[facility] Emergency Department Provider Note   ____________________________________________   First MD Initiated Contact with Patient 05/06/17 [phone]     (approximate)  I have reviewed the triage vital signs and the nursing notes.   HISTORY  Chief Complaint Chest Pain    HPI Lance Nunez is a 67 y.o. male who comes into the hospital tonight stating that his defibrillator fired.  The patient states that he was sleeping and he was woken up by his defibrillator firing.  He reports that afterwards he felt fine but he called EMS to get checked out.  He states that it only went off once.  He denies pain at this time.  The patient's cardiologist is Dr. Nehemiah Massed and he states he saw him maybe 6 months ago.  The patient does not have any other complaints at this time.  He denies any shortness of breath nausea vomiting dizziness lightheadedness.   Past Medical History:  Diagnosis Date  . COPD (chronic obstructive pulmonary disease) ([place])   . Coronary artery disease   . Diabetes mellitus without complication ([place])   . Hypertension   . Myocardial infarction [facility])    95' 97' 98'    Patient Active Problem List   Diagnosis Date Noted  . 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 Dilated cardiomyopathy ([place]) 09/10/2015  . Diabetes mellitus without complication ([place]) 41/93/7902  . HTN, goal below 140/80 09/10/2015    Past Surgical History:  Procedure Laterality Date  . Fractured leg Right   . IMPLANTABLE CARDIOVERTER DEFIBRILLATOR (ICD) GENERATOR CHANGE N/A 09/06/2015   Procedure: ICD GENERATOR CHANGE;  Surgeon: Isaias Cowman, MD;  Location: [facility] ORS;  Service: Cardiovascular;  Laterality: N/A;  . IMPLANTABLE CARDIOVERTER DEFIBRILLATOR (ICD) GENERATOR CHANGE Left 09/13/2015   Procedure: ICD GENERATOR CHANGE/REVISION;  Surgeon: Isaias Cowman, MD;  Location: [facility] ORS;  Service:  Cardiovascular;  Laterality: Left;  . Stents N/A    4 cardiac stents    Prior to Admission medications   Medication Sig Start Date End Date Taking? Authorizing Provider  amiodarone (PACERONE) 200 MG tablet Take 1 tablet (200 mg total) by mouth 2 (two) times daily. Patient taking differently: Take 200 mg by mouth daily.  01/15/16   Bettey Costa, MD  aspirin 81 MG tablet Take 81 mg by mouth daily.    [provider]  carvedilol (COREG) 6.25 MG tablet Take 6.25 mg by mouth 2 (two) times daily with a meal.    [provider]  gabapentin (NEURONTIN) 600 MG tablet Take 600 mg by mouth 3 (three) times daily.    [provider]  gemfibrozil (LOPID) 600 MG tablet Take 600 mg by mouth daily.     [provider]  glipiZIDE (GLUCOTROL) 10 MG tablet Take 10 mg by mouth daily before breakfast.    [provider]  lovastatin (MEVACOR) 40 MG tablet Take 80 mg by mouth at bedtime.     [provider]  meclizine (ANTIVERT) 25 MG tablet Take 1 tablet (25 mg total) by mouth 3 (three) times daily as needed for dizziness. 04/30/17   Lisa Roca, MD  metFORMIN (GLUCOPHAGE) 500 MG tablet Take 500 mg by mouth daily at 6 (six) AM.    [provider]  nitroGLYCERIN (NITROSTAT) 0.4 MG SL tablet Place 0.4 mg under the tongue every 5 (five) minutes as needed for chest pain.    [provider]  quinapril (ACCUPRIL) 10 MG tablet Take 10 mg  by mouth daily.    [provider]  ranitidine (ZANTAC) 150 MG tablet Take 150 mg by mouth 2 (two) times daily as needed for heartburn.     [provider]  spironolactone (ALDACTONE) 25 MG tablet Take 12.5 mg by mouth at bedtime.     [provider]  sucralfate (CARAFATE) 1 g tablet Take 1 g by mouth 3 (three) times daily.    [provider]  verapamil (CALAN-SR) 180 MG CR tablet Take 180 mg by mouth at bedtime.    [provider]    Allergies Prednisone; Axid  [nizatidine]; and Codeine  No family history on file.  Social History Social History   Tobacco Use  . Smoking status: Former Smoker    Last attempt to quit: 07/19/2004    Years since quitting: 12.8  . Smokeless tobacco: Never Used  Substance Use Topics  . Alcohol use: No  . Drug use: No    Review of Systems  Constitutional: No fever/chills Eyes: No visual changes. ENT: No sore throat. Cardiovascular: Denies chest pain. Respiratory: Denies shortness of breath. Gastrointestinal: No abdominal pain.  No nausea, no vomiting.  No diarrhea.  No constipation. Genitourinary: Negative for dysuria. Musculoskeletal: Negative for back pain. Skin: Negative for rash. Neurological: Negative for headaches, focal weakness or numbness.   ____________________________________________   PHYSICAL EXAM:  VITAL SIGNS: ED Triage Vitals  Enc Vitals Group     BP 05/06/17 0319 (!) 154/92     Pulse Rate 05/06/17 0319 68     Resp 05/06/17 0319 18     Temp 05/06/17 0319 97.9 F (36.6 C)     Temp Source 05/06/17 0319 Oral     SpO2 05/06/17 0319 97 %     Weight 05/06/17 0319 210 lb (95.3 kg)     Height 05/06/17 0319 5\' 4"  (1.626 m)     Head Circumference --      Peak Flow --      Pain Score 05/06/17 0357 0     Pain Loc --      Pain Edu? --      Excl. in [employer]? --     Constitutional: Alert and oriented. Well appearing and in no acute distress. Eyes: Conjunctivae are normal. PERRL. EOMI. Head: Atraumatic. Nose: No congestion/rhinnorhea. Mouth/Throat: Mucous membranes are moist.  Oropharynx non-erythematous. Cardiovascular: Normal rate, regular rhythm. Grossly normal heart sounds.  Good peripheral circulation. Respiratory: Normal respiratory effort.  No retractions. Lungs CTAB. Gastrointestinal: Soft and nontender. No distention.  Positive bowel sounds Musculoskeletal: No lower extremity tenderness nor edema.   Neurologic:  Normal speech and language.   Skin:  Skin is warm, dry and intact.    Psychiatric: Mood and affect are normal.   ____________________________________________   LABS (all labs ordered are listed, but only abnormal results are displayed)  Labs Reviewed  CBC - Abnormal; Notable for the following components:      Result Value   RBC 4.08 (*)    Hemoglobin 11.5 (*)    HCT 35.1 (*)    RDW 15.7 (*)    All other components within normal limits  COMPREHENSIVE METABOLIC PANEL - Abnormal; Notable for the following components:   Glucose, Bld 104 (*)    BUN 21 (*)    Creatinine, Ser 1.52 (*)    Calcium 8.1 (*)    Total Bilirubin 0.2 (*)    GFR calc non Af Amer 46 (*)    GFR calc Af Amer 53 (*)  All other components within normal limits  TROPONIN I   ____________________________________________  EKG  ED ECG REPORT I, Loney Hering, the attending physician, personally viewed and interpreted this ECG.   Date: 05/06/2017  EKG Time: 314  Rate: 69  Rhythm: normal sinus rhythm  Axis: normal  Intervals:incomplete LBBB  ST&T Change: none  ____________________________________________  RADIOLOGY  ED MD interpretation:  CXR: No acute abnormality  Official radiology report(s): Dg Chest 2 View  Result Date: 05/06/2017 CLINICAL DATA:  Chest pain. Patient reports defibrillator shocked while sleeping. EXAM: CHEST - 2 VIEW COMPARISON:  Radiograph 01/15/2016 FINDINGS: Left-sided pacemaker in place with leads projecting over the right atrium and ventricle. Leads appear intact. Heart size upper normal. Normal mediastinal contours. Coronary stent or calcification. No pulmonary edema. Linear atelectasis in the right lower lung zone. No confluent airspace disease. No pleural fluid or pneumothorax. No acute osseous abnormalities. IMPRESSION: No acute abnormality.  Left-sided pacemaker in place. Electronically Signed   By: Jeb Levering M.D.   On: 05/06/2017 03:50    ____________________________________________   PROCEDURES  Procedure(s) performed:  None  Procedures  Critical Care performed: No  ____________________________________________   INITIAL IMPRESSION / ASSESSMENT AND PLAN / ED COURSE  As part of my medical decision making, I reviewed the following data within the electronic MEDICAL RECORD NUMBER Notes from prior ED visits and  Controlled Substance Database   This is a 67 year old male who comes into the hospital stating that his defibrillator fired.  My differential diagnosis includes ventricular tachycardia, atrial fibrillation with rapid ventricular response.  We did check some blood work to include a CBC troponin and a CMP.  We also sent the patient for a chest x-ray and we interrogated his pacemaker.  The patient's blood work was unremarkable.  His creatinine was 1.52 and his hemoglobin was 11.5.  The patient's troponin is negative and his chest x-ray is negative.  The patient's blood work is at his baseline.   I did receive a phone call from the [employer] representative. According to the representative the patient's last interrogation was December 14, 2016.  Since then no arrhythmias have been detected and no defibrillation therapies have been delivered.  There is some possible intrathoracic fluid accumulation but otherwise the device appears to be functioning.  Since the patient did not receive any shocks I will discharge him to home to have a follow-up with his cardiologist.  The patient has no complaints at this time.      ____________________________________________   FINAL CLINICAL IMPRESSION(S) / ED DIAGNOSES  Final diagnoses:  Defibrillator discharge     ED Discharge Orders    None       Note:  This document was prepared using Dragon voice recognition software and may include unintentional dictation errors.    Loney Hering, MD 05/06/17 [phone]

## 2017-05-06 NOTE — ED Triage Notes (Signed)
Pt states defibrillator shocked him while asleep. States woke him up, no pain at this time.

## 2017-05-06 NOTE — Discharge Instructions (Signed)
I discussed your defibrillator results with the [employer] representative.  They state that no arrhythmias have been detected and no defibrillation therapies have been delivered by the device.  Please follow back up with Dr. Nehemiah Massed for further evaluation of your defibrillator.  Please return with any other concerns.

## 2017-05-06 NOTE — ED Notes (Signed)
Patient reports he was awoke from sleep approx 45 minutes ago due to implanted ICD shocking him. Patient denies pain, SOB, dizziness, lightheadedness, weakness and nausea.

## 2017-08-17 ENCOUNTER — Other Ambulatory Visit: Payer: Self-pay

## 2017-08-17 ENCOUNTER — Emergency Department: Payer: Medicare Other

## 2017-08-17 ENCOUNTER — Inpatient Hospital Stay
Admission: EM | Admit: 2017-08-17 | Discharge: 2017-08-19 | DRG: 302 | Disposition: A | Discharge status: Home / Self Care | Payer: Medicare Other | Attending: Internal Medicine | Admitting: Internal Medicine

## 2017-08-17 DIAGNOSIS — I129 Hypertensive chronic kidney disease with stage 1 through stage 4 chronic kidney disease, or unspecified chronic kidney disease: Secondary | ICD-10-CM | POA: Diagnosis present

## 2017-08-17 DIAGNOSIS — J9601 Acute respiratory failure with hypoxia: Secondary | ICD-10-CM | POA: Diagnosis present

## 2017-08-17 DIAGNOSIS — J449 Chronic obstructive pulmonary disease, unspecified: Secondary | ICD-10-CM | POA: Diagnosis present

## 2017-08-17 DIAGNOSIS — Z885 Allergy status to narcotic agent status: Secondary | ICD-10-CM

## 2017-08-17 DIAGNOSIS — I25110 Atherosclerotic heart disease of native coronary artery with unstable angina pectoris: Principal | ICD-10-CM | POA: Diagnosis present

## 2017-08-17 DIAGNOSIS — N183 Chronic kidney disease, stage 3 (moderate): Secondary | ICD-10-CM | POA: Diagnosis present

## 2017-08-17 DIAGNOSIS — Z888 Allergy status to other drugs, medicaments and biological substances status: Secondary | ICD-10-CM

## 2017-08-17 DIAGNOSIS — R079 Chest pain, unspecified: Secondary | ICD-10-CM | POA: Diagnosis present

## 2017-08-17 DIAGNOSIS — E1122 Type 2 diabetes mellitus with diabetic chronic kidney disease: Secondary | ICD-10-CM | POA: Diagnosis present

## 2017-08-17 DIAGNOSIS — I200 Unstable angina: Secondary | ICD-10-CM | POA: Diagnosis not present

## 2017-08-17 DIAGNOSIS — G4733 Obstructive sleep apnea (adult) (pediatric): Secondary | ICD-10-CM | POA: Diagnosis present

## 2017-08-17 DIAGNOSIS — Z7984 Long term (current) use of oral hypoglycemic drugs: Secondary | ICD-10-CM

## 2017-08-17 DIAGNOSIS — R7989 Other specified abnormal findings of blood chemistry: Secondary | ICD-10-CM

## 2017-08-17 DIAGNOSIS — Z95810 Presence of automatic (implantable) cardiac defibrillator: Secondary | ICD-10-CM

## 2017-08-17 DIAGNOSIS — Z7982 Long term (current) use of aspirin: Secondary | ICD-10-CM

## 2017-08-17 DIAGNOSIS — I255 Ischemic cardiomyopathy: Secondary | ICD-10-CM | POA: Diagnosis present

## 2017-08-17 DIAGNOSIS — I252 Old myocardial infarction: Secondary | ICD-10-CM

## 2017-08-17 DIAGNOSIS — I447 Left bundle-branch block, unspecified: Secondary | ICD-10-CM | POA: Diagnosis present

## 2017-08-17 DIAGNOSIS — Z955 Presence of coronary angioplasty implant and graft: Secondary | ICD-10-CM

## 2017-08-17 DIAGNOSIS — Z87891 Personal history of nicotine dependence: Secondary | ICD-10-CM

## 2017-08-17 LAB — GLUCOSE, CAPILLARY: GLUCOSE-CAPILLARY: 76 mg/dL (ref 70–99)

## 2017-08-17 LAB — CBC
HEMATOCRIT: 33.9 % — AB (ref 40.0–52.0)
Hemoglobin: 11.1 g/dL — ABNORMAL LOW (ref 13.0–18.0)
MCH: 26.1 pg (ref 26.0–34.0)
MCHC: 32.6 g/dL (ref 32.0–36.0)
MCV: 80.2 fL (ref 80.0–100.0)
PLATELETS: 265 10*3/uL (ref 150–440)
RBC: 4.23 MIL/uL — AB (ref 4.40–5.90)
RDW: 18.5 % — ABNORMAL HIGH (ref 11.5–14.5)
WBC: 7.8 10*3/uL (ref 3.8–10.6)

## 2017-08-17 LAB — BASIC METABOLIC PANEL
Anion gap: 6 (ref 5–15)
BUN: 21 mg/dL (ref 8–23)
CHLORIDE: 104 mmol/L (ref 98–111)
CO2: 31 mmol/L (ref 22–32)
Calcium: 8.1 mg/dL — ABNORMAL LOW (ref 8.9–10.3)
Creatinine, Ser: 1.44 mg/dL — ABNORMAL HIGH (ref 0.61–1.24)
GFR, EST AFRICAN AMERICAN: 57 mL/min — AB (ref >60)
GFR, EST NON AFRICAN AMERICAN: 49 mL/min — AB (ref >60)
Glucose, Bld: 105 mg/dL — ABNORMAL HIGH (ref 70–99)
POTASSIUM: 4 mmol/L (ref 3.5–5.1)
SODIUM: 141 mmol/L (ref 135–145)

## 2017-08-17 LAB — TROPONIN I
Troponin I: <0.03 ng/mL (ref <0.03)
Troponin I: <0.03 ng/mL (ref <0.03)

## 2017-08-17 LAB — PROTIME-INR
INR: 0.91
PROTHROMBIN TIME: 12.2 s (ref 11.4–15.2)

## 2017-08-17 MED ORDER — SODIUM CHLORIDE 0.9% FLUSH
3.0000 mL | INTRAVENOUS | Status: DC | PRN
  Administered 2017-08-17: 3 mL via INTRAVENOUS
  Filled 2017-08-17: qty 3

## 2017-08-17 MED ORDER — MORPHINE SULFATE (PF) 2 MG/ML IV SOLN: 2.0000 mg | INTRAVENOUS | Status: DC | PRN

## 2017-08-17 MED ORDER — VERAPAMIL HCL ER 180 MG PO TBCR
180.0000 mg | EXTENDED_RELEASE_TABLET | Freq: Every day | ORAL | Status: DC
  Administered 2017-08-17: 180 mg via ORAL
  Filled 2017-08-17: qty 1

## 2017-08-17 MED ORDER — LISINOPRIL 10 MG PO TABS
10.0000 mg | ORAL_TABLET | Freq: Every day | ORAL | Status: DC
Start: 2017-08-18 — End: 2017-08-19
  Administered 2017-08-18 – 2017-08-19 (×2): 10 mg via ORAL
  Filled 2017-08-17 (×2): qty 1

## 2017-08-17 MED ORDER — SODIUM CHLORIDE 0.9% FLUSH
3.0000 mL | Freq: Two times a day (BID) | INTRAVENOUS | Status: DC
  Administered 2017-08-17 – 2017-08-19 (×4): 3 mL via INTRAVENOUS

## 2017-08-17 MED ORDER — ACETAMINOPHEN 325 MG PO TABS: 650.0000 mg | ORAL_TABLET | ORAL | Status: DC | PRN

## 2017-08-17 MED ORDER — MECLIZINE HCL 25 MG PO TABS
25.0000 mg | ORAL_TABLET | Freq: Three times a day (TID) | ORAL | Status: DC | PRN
  Filled 2017-08-17: qty 1

## 2017-08-17 MED ORDER — ASPIRIN 81 MG PO CHEW: 324.0000 mg | CHEWABLE_TABLET | ORAL | Status: AC

## 2017-08-17 MED ORDER — ONDANSETRON HCL 4 MG/2ML IJ SOLN: 4.0000 mg | Freq: Four times a day (QID) | INTRAMUSCULAR | Status: DC | PRN

## 2017-08-17 MED ORDER — GEMFIBROZIL 600 MG PO TABS
600.0000 mg | ORAL_TABLET | Freq: Every day | ORAL | Status: DC
  Filled 2017-08-17: qty 1

## 2017-08-17 MED ORDER — SODIUM CHLORIDE 0.9 % IV SOLN: 250.0000 mL | INTRAVENOUS | Status: DC | PRN

## 2017-08-17 MED ORDER — HEPARIN SODIUM (PORCINE) 5000 UNIT/ML IJ SOLN
5000.0000 [IU] | Freq: Three times a day (TID) | INTRAMUSCULAR | Status: DC
  Administered 2017-08-17 – 2017-08-19 (×4): 5000 [IU] via SUBCUTANEOUS
  Filled 2017-08-17 (×4): qty 1

## 2017-08-17 MED ORDER — ASPIRIN EC 81 MG PO TBEC
81.0000 mg | DELAYED_RELEASE_TABLET | Freq: Every day | ORAL | Status: DC
  Administered 2017-08-18 – 2017-08-19 (×2): 81 mg via ORAL
  Filled 2017-08-17 (×2): qty 1

## 2017-08-17 MED ORDER — ASPIRIN 300 MG RE SUPP: 300.0000 mg | RECTAL | Status: AC

## 2017-08-17 MED ORDER — SPIRONOLACTONE 25 MG PO TABS
12.5000 mg | ORAL_TABLET | Freq: Every day | ORAL | Status: DC
  Administered 2017-08-17 – 2017-08-18 (×2): 12.5 mg via ORAL
  Filled 2017-08-17: qty 0.5
  Filled 2017-08-17: qty 1
  Filled 2017-08-17: qty 0.5
  Filled 2017-08-17: qty 1
  Filled 2017-08-17: qty 0.5

## 2017-08-17 MED ORDER — INSULIN ASPART 100 UNIT/ML ~~LOC~~ SOLN: 0.0000 [IU] | Freq: Every day | SUBCUTANEOUS | Status: DC

## 2017-08-17 MED ORDER — AMIODARONE HCL 200 MG PO TABS
200.0000 mg | ORAL_TABLET | Freq: Every day | ORAL | Status: DC
  Administered 2017-08-17 – 2017-08-19 (×3): 200 mg via ORAL
  Filled 2017-08-17 (×3): qty 1

## 2017-08-17 MED ORDER — INSULIN ASPART 100 UNIT/ML ~~LOC~~ SOLN
0.0000 [IU] | Freq: Three times a day (TID) | SUBCUTANEOUS | Status: DC
  Administered 2017-08-18: 5 [IU] via SUBCUTANEOUS
  Administered 2017-08-19: 3 [IU] via SUBCUTANEOUS
  Filled 2017-08-17 (×2): qty 1

## 2017-08-17 MED ORDER — GLIPIZIDE 10 MG PO TABS
10.0000 mg | ORAL_TABLET | Freq: Every day | ORAL | Status: DC
  Filled 2017-08-17: qty 1

## 2017-08-17 MED ORDER — PRAVASTATIN SODIUM 40 MG PO TABS: 40.0000 mg | ORAL_TABLET | Freq: Every day | ORAL | Status: DC

## 2017-08-17 MED ORDER — GABAPENTIN 600 MG PO TABS
600.0000 mg | ORAL_TABLET | Freq: Three times a day (TID) | ORAL | Status: DC
  Administered 2017-08-17: 600 mg via ORAL
  Filled 2017-08-17: qty 1

## 2017-08-17 MED ORDER — ASPIRIN 81 MG PO TABS: 81.0000 mg | ORAL_TABLET | Freq: Every day | ORAL | Status: DC

## 2017-08-17 MED ORDER — CARVEDILOL 6.25 MG PO TABS: 6.2500 mg | ORAL_TABLET | Freq: Two times a day (BID) | ORAL | Status: DC

## 2017-08-17 MED ORDER — NITROGLYCERIN 0.4 MG SL SUBL: 0.4000 mg | SUBLINGUAL_TABLET | SUBLINGUAL | Status: DC | PRN

## 2017-08-17 MED ORDER — SUCRALFATE 1 G PO TABS
1.0000 g | ORAL_TABLET | Freq: Three times a day (TID) | ORAL | Status: DC
  Administered 2017-08-17 – 2017-08-19 (×5): 1 g via ORAL
  Filled 2017-08-17 (×5): qty 1

## 2017-08-17 MED ORDER — ASPIRIN 81 MG PO CHEW
324.0000 mg | CHEWABLE_TABLET | Freq: Once | ORAL | Status: AC
  Administered 2017-08-17: 324 mg via ORAL
  Filled 2017-08-17: qty 4

## 2017-08-17 NOTE — H&P (Signed)
[facility] [place] NAME: Lance Nunez    MR#:  [id number]  DATE OF BIRTH:  01-16-1951  DATE OF ADMISSION:  08/17/2017  PRIMARY CARE PHYSICIAN: Donnie Coffin, MD   REQUESTING/REFERRING PHYSICIAN:   CHIEF COMPLAINT:   Chief Complaint  Patient presents with  . Chest Pain    HISTORY OF PRESENT ILLNESS: Rogelio Waynick  is a 67 y.o. male with a known history of coronary artery disease, cardiac stent, type 2 diabetes mellitus, hypertension, hyperlipidemia, COPD presented to the emergency room with chest pain.  The chest pain started this morning and is located in left-sided chest sharp in nature 7 out of 10 on a scale of 1-10.  Patient follows up with [facility] clinic cardiology as outpatient.  No complaints of any shortness of breath, orthopnea.  First set of troponin is negative.  Hospitalist service was consulted for further care.  PAST MEDICAL HISTORY:   Past Medical History:  Diagnosis Date  . COPD (chronic obstructive pulmonary disease) ([place])   . Coronary artery disease   . Diabetes mellitus without complication ([place])   . Hypertension   . Myocardial infarction [facility])    95' 97' 98'    PAST SURGICAL HISTORY:  Past Surgical History:  Procedure Laterality Date  . Fractured leg Right   . IMPLANTABLE CARDIOVERTER DEFIBRILLATOR (ICD) GENERATOR CHANGE N/A 09/06/2015   Procedure: ICD GENERATOR CHANGE;  Surgeon: Isaias Cowman, MD;  Location: [facility] ORS;  Service: Cardiovascular;  Laterality: N/A;  . IMPLANTABLE CARDIOVERTER DEFIBRILLATOR (ICD) GENERATOR CHANGE Left 09/13/2015   Procedure: ICD GENERATOR CHANGE/REVISION;  Surgeon: Isaias Cowman, MD;  Location: [facility] ORS;  Service: Cardiovascular;  Laterality: Left;  . Stents N/A    4 cardiac stents    SOCIAL HISTORY:  Social History   Tobacco Use  . Smoking status: Former Smoker    Last attempt to quit: 07/19/2004    Years since quitting: 13.0  . Smokeless tobacco:  Never Used  Substance Use Topics  . Alcohol use: No    FAMILY HISTORY: History reviewed. No pertinent family history.  DRUG ALLERGIES:  Allergies  Allergen Reactions  . Prednisone Other (See Comments)    Pt states, "I just cant take it, I dont know"  . Axid [Nizatidine]   . Codeine Itching    REVIEW OF SYSTEMS:   CONSTITUTIONAL: No fever, fatigue or weakness.  EYES: No blurred or double vision.  EARS, NOSE, AND THROAT: No tinnitus or ear pain.  RESPIRATORY: No cough, shortness of breath, wheezing or hemoptysis.  CARDIOVASCULAR: Has chest pain, no orthopnea, edema.  GASTROINTESTINAL: No nausea, vomiting, diarrhea or abdominal pain.  GENITOURINARY: No dysuria, hematuria.  ENDOCRINE: No polyuria, nocturia,  HEMATOLOGY: No anemia, easy bruising or bleeding SKIN: No rash or lesion. MUSCULOSKELETAL: No joint pain or arthritis.   NEUROLOGIC: No tingling, numbness, weakness.  PSYCHIATRY: No anxiety or depression.   MEDICATIONS AT HOME:  Prior to Admission medications   Medication Sig Start Date End Date Taking? Authorizing Provider  amiodarone (PACERONE) 200 MG tablet Take 1 tablet (200 mg total) by mouth 2 (two) times daily. Patient taking differently: Take 200 mg by mouth daily.  01/15/16   Bettey Costa, MD  aspirin 81 MG tablet Take 81 mg by mouth daily.    [provider]  carvedilol (COREG) 6.25 MG tablet Take 6.25 mg by mouth 2 (two) times daily with a meal.    [provider]  gabapentin (NEURONTIN) 600 MG tablet  Take 600 mg by mouth 3 (three) times daily.    [provider]  gemfibrozil (LOPID) 600 MG tablet Take 600 mg by mouth daily.     [provider]  glipiZIDE (GLUCOTROL) 10 MG tablet Take 10 mg by mouth daily before breakfast.    [provider]  lovastatin (MEVACOR) 40 MG tablet Take 80 mg by mouth at bedtime.     [provider]  meclizine (ANTIVERT) 25 MG tablet Take 1 tablet (25 mg total) by mouth 3 (three)  times daily as needed for dizziness. 04/30/17   Lisa Roca, MD  metFORMIN (GLUCOPHAGE) 500 MG tablet Take 500 mg by mouth daily at 6 (six) AM.    [provider]  nitroGLYCERIN (NITROSTAT) 0.4 MG SL tablet Place 0.4 mg under the tongue every 5 (five) minutes as needed for chest pain.    [provider]  quinapril (ACCUPRIL) 10 MG tablet Take 10 mg by mouth daily.    [provider]  ranitidine (ZANTAC) 150 MG tablet Take 150 mg by mouth 2 (two) times daily as needed for heartburn.     [provider]  spironolactone (ALDACTONE) 25 MG tablet Take 12.5 mg by mouth at bedtime.     [provider]  sucralfate (CARAFATE) 1 g tablet Take 1 g by mouth 3 (three) times daily.    [provider]  verapamil (CALAN-SR) 180 MG CR tablet Take 180 mg by mouth at bedtime.    [provider]      PHYSICAL EXAMINATION:   VITAL SIGNS: Blood pressure 138/65, pulse 68, temperature 99.2 F (37.3 C), temperature source Oral, resp. rate (!) 26, height 5\' 4"  (1.626 m), weight 99.8 kg (220 lb), SpO2 (!) 88 %.  GENERAL:  67 y.o.-year-old patient lying in the bed with no acute distress.  EYES: Pupils equal, round, reactive to light and accommodation. No scleral icterus. Extraocular muscles intact.  HEENT: Head atraumatic, normocephalic. Oropharynx and nasopharynx clear.  NECK:  Supple, no jugular venous distention. No thyroid enlargement, no tenderness.  LUNGS: Normal breath sounds bilaterally, no wheezing, rales,rhonchi or crepitation. No use of accessory muscles of respiration.  CARDIOVASCULAR: S1, S2 normal. No murmurs, rubs, or gallops.  ABDOMEN: Soft, nontender, nondistended. Bowel sounds present. No organomegaly or mass.  EXTREMITIES: No pedal edema, cyanosis, or clubbing.  NEUROLOGIC: Cranial nerves II through XII are intact. Muscle strength 5/5 in all extremities. Sensation intact. Gait not checked.  PSYCHIATRIC: The patient is alert and oriented  x 3.  SKIN: No obvious rash, lesion, or ulcer.   LABORATORY PANEL:   CBC Recent Labs  Lab 08/17/17 1739  WBC 7.8  HGB 11.1*  HCT 33.9*  PLT 265  MCV 80.2  MCH 26.1  MCHC 32.6  RDW 18.5*   ------------------------------------------------------------------------------------------------------------------  Chemistries  Recent Labs  Lab 08/17/17 1739  NA 141  K 4.0  CL 104  CO2 31  GLUCOSE 105*  BUN 21  CREATININE 1.44*  CALCIUM 8.1*   ------------------------------------------------------------------------------------------------------------------ estimated creatinine clearance is 53.1 mL/min (A) (by C-G formula based on SCr of 1.44 mg/dL (H)). ------------------------------------------------------------------------------------------------------------------ No results for input(s): TSH, T4TOTAL, T3FREE, THYROIDAB in the last 72 hours.  Invalid input(s): FREET3   Coagulation profile Recent Labs  Lab 08/17/17 1739  INR 0.91   ------------------------------------------------------------------------------------------------------------------- No results for input(s): DDIMER in the last 72 hours. -------------------------------------------------------------------------------------------------------------------  Cardiac Enzymes Recent Labs  Lab 08/17/17 1739  TROPONINI <0.03   ------------------------------------------------------------------------------------------------------------------ Invalid input(s): POCBNP  ---------------------------------------------------------------------------------------------------------------  Urinalysis  Component Value Date/Time   COLORURINE STRAW (A) 04/29/2017 0755   APPEARANCEUR CLEAR (A) 04/29/2017 0755   LABSPEC 1.014 04/29/2017 0755   PHURINE 6.0 04/29/2017 0755   GLUCOSEU NEGATIVE 04/29/2017 0755   HGBUR NEGATIVE 04/29/2017 0755   BILIRUBINUR NEGATIVE 04/29/2017 0755   KETONESUR NEGATIVE 04/29/2017 0755    PROTEINUR 30 (A) 04/29/2017 0755   NITRITE NEGATIVE 04/29/2017 0755   LEUKOCYTESUR NEGATIVE 04/29/2017 0755     RADIOLOGY: Dg Chest 2 View  Result Date: 08/17/2017 CLINICAL DATA:  Chest pain and shortness of breath EXAM: CHEST - 2 VIEW COMPARISON:  05/06/2017 01/15/2016 FINDINGS: Left-sided pacing device as before. Scarring or atelectasis at the right base. No acute consolidation or pleural effusion. Stable mildly enlarged cardiomediastinal silhouette. No pneumothorax. IMPRESSION: No active cardiopulmonary disease. Linear scarring or atelectasis at the right base. Electronically Signed   By: Donavan Foil M.D.   On: 08/17/2017 18:26    EKG: Orders placed or performed during the hospital encounter of 08/17/17  . ED EKG within 10 minutes  . ED EKG within 10 minutes    IMPRESSION AND PLAN:  67 year old male patient with history of coronary disease, cardiac stent, type 2 diabetes mellitus, COPD, hypertension presented to the emergency room for chest pain.  -Chest pain Admit patient to telemetry observation bed Start patient on aspirin, nitrates and PRN morphine Cycle troponin and check echocardiogram Cardiology evaluation  -Coronary disease with history of cardiac stent Resume statin medication, beta-blocker  -Type 2 diabetes mellitus Diabetic diet with sliding scale coverage with insulin  -Emphysema Home dose inhaler treatments  -DVT prophylaxis with subcu heparin  All the records are reviewed and case discussed with ED provider. Management plans discussed with the patient, family and they are in agreement.  CODE STATUS: Full code Code Status History    Date Active Date Inactive Code Status Order ID Comments User Context   01/15/2016 0351 01/15/2016 1629 Full Code [id number]  Saundra Shelling, MD Inpatient   09/10/2015 2048 09/13/2015 2016 Full Code [id number]  Idelle Crouch, MD Inpatient   09/06/2015 1506 09/06/2015 1856 Full Code [id number]  Isaias Cowman, MD Inpatient        TOTAL TIME TAKING CARE OF THIS PATIENT: 53 minutes.    Saundra Shelling M.D on 08/17/2017 at 7:20 PM  Between 7am to 6pm - Pager - [phone]  After 6pm go to [email or web address] - password [email or web address] [facility]  [place] [facility]  Office  [phone]  CC: Primary care physician; Donnie Coffin, MD

## 2017-08-17 NOTE — ED Provider Notes (Signed)
[facility] Emergency Department Provider Note  ____________________________________________  Time seen: Approximately 5:39 PM  I have reviewed the triage vital signs and the nursing notes.   HISTORY  Chief Complaint Chest Pain   HPI Saiquan Hands Lease is a 67 y.o. male with a history of CAD status post stent and MI, COPD, diabetes, V. tach status post AICD, HTN, and OSA who presents for evaluation of chest pain.  Patient reports that he was walking to the bathroom after getting up from the couch when he developed a sudden onset of severe substernal chest pressure, non radiating, associated with SOB, dizziness, and diaphoresis. Patient sat down and pain resolved after 5 min. While being transported into the ambulance he had another episode lasting 5 min. He is now pain free.    Past Medical History:  Diagnosis Date  . COPD (chronic obstructive pulmonary disease) ([place])   . Coronary artery disease   . Diabetes mellitus without complication ([place])   . Hypertension   . Myocardial infarction [facility])    95' 97' 98'    Patient Active Problem List   Diagnosis Date Noted  . Acute respiratory failure with hypoxemia ([place]) 08/18/2017  . Chest pain 08/17/2017  . 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 Dilated cardiomyopathy ([place]) 09/10/2015  . Diabetes mellitus without complication ([place]) 53/61/4431  . HTN, goal below 140/80 09/10/2015    Past Surgical History:  Procedure Laterality Date  . Fractured leg Right   . IMPLANTABLE CARDIOVERTER DEFIBRILLATOR (ICD) GENERATOR CHANGE N/A 09/06/2015   Procedure: ICD GENERATOR CHANGE;  Surgeon: Isaias Cowman, MD;  Location: [facility] ORS;  Service: Cardiovascular;  Laterality: N/A;  . IMPLANTABLE CARDIOVERTER DEFIBRILLATOR (ICD) GENERATOR CHANGE Left 09/13/2015   Procedure: ICD GENERATOR CHANGE/REVISION;  Surgeon: Isaias Cowman, MD;  Location: [facility]  ORS;  Service: Cardiovascular;  Laterality: Left;  . Stents N/A    4 cardiac stents    Prior to Admission medications   Medication Sig Start Date End Date Taking? Authorizing Provider  aspirin EC 81 MG tablet Take 81 mg by mouth daily.   Yes [provider]  glipiZIDE (GLUCOTROL XL) 10 MG 24 hr tablet Take 10 mg by mouth 2 (two) times daily.   Yes [provider]  meclizine (ANTIVERT) 25 MG tablet Take 1 tablet (25 mg total) by mouth 3 (three) times daily as needed for dizziness. 04/30/17  Yes Lisa Roca, MD  metFORMIN (GLUCOPHAGE) 500 MG tablet Take 500 mg by mouth daily.    Yes [provider]  nitroGLYCERIN (NITROSTAT) 0.4 MG SL tablet Place 0.4 mg under the tongue every 5 (five) minutes as needed for chest pain.   Yes [provider]  quinapril (ACCUPRIL) 20 MG tablet Take 20 mg by mouth daily.   Yes [provider]  ranitidine (ZANTAC) 150 MG tablet Take 150 mg by mouth 2 (two) times daily as needed for heartburn.    Yes [provider]  verapamil (CALAN-SR) 180 MG CR tablet Take 180 mg by mouth daily.   Yes [provider]  albuterol (PROVENTIL HFA;VENTOLIN HFA) 108 (90 Base) MCG/ACT inhaler Inhale 1-2 puffs into the lungs every 6 (six) hours as needed for wheezing or shortness of breath. 08/18/17   Demetrios Loll, MD  amiodarone (PACERONE) 200 MG tablet Take 1 tablet (200 mg total) by mouth daily. 08/18/17   Demetrios Loll, MD  atorvastatin (LIPITOR) 40 MG tablet Take 1 tablet (40 mg total) by mouth  daily at 6 PM. 08/18/17   Demetrios Loll, MD  carvedilol (COREG) 12.5 MG tablet Take 1 tablet (12.5 mg total) by mouth 2 (two) times daily with a meal. 08/18/17   Demetrios Loll, MD    Allergies Prednisone; Axid [nizatidine]; and Codeine  History reviewed. No pertinent family history.  Social History Social History   Tobacco Use  . Smoking status: Former Smoker    Last attempt to quit: 07/19/2004    Years since quitting: 13.0  . Smokeless  tobacco: Never Used  Substance Use Topics  . Alcohol use: No  . Drug use: No    Review of Systems  Constitutional: Negative for fever. + dizziness Eyes: Negative for visual changes. ENT: Negative for sore throat. Neck: No neck pain  Cardiovascular: + chest pain and diaphoresis Respiratory: + shortness of breath. Gastrointestinal: Negative for abdominal pain, vomiting or diarrhea. Genitourinary: Negative for dysuria. Musculoskeletal: Negative for back pain. Skin: Negative for rash. Neurological: Negative for headaches, weakness or numbness. Psych: No SI or HI  ____________________________________________   PHYSICAL EXAM:  VITAL SIGNS: ED Triage Vitals  Enc Vitals Group     BP 08/17/17 1734 (!) 146/99     Pulse Rate 08/17/17 1728 71     Resp 08/17/17 1728 18     Temp 08/17/17 1728 99.2 F (37.3 C)     Temp Source 08/17/17 1728 Oral     SpO2 08/17/17 1728 98 %     Weight 08/17/17 1732 220 lb (99.8 kg)     Height 08/17/17 1732 5\' 4"  (1.626 Nunez)     Head Circumference --      Peak Flow --      Pain Score 08/17/17 1730 0     Pain Loc --      Pain Edu? --      Excl. in [place]? --     Constitutional: Alert and oriented. Well appearing and in no apparent distress. HEENT:      Head: Normocephalic and atraumatic.         Eyes: Conjunctivae are normal. Sclera is non-icteric.       Mouth/Throat: Mucous membranes are moist.       Neck: Supple with no signs of meningismus. Cardiovascular: Regular rate and rhythm. No murmurs, gallops, or rubs. 2+ symmetrical distal pulses are present in all extremities. No JVD. Respiratory: Normal respiratory effort. Lungs are clear to auscultation bilaterally. No wheezes, crackles, or rhonchi.  Gastrointestinal: Soft, non tender, and non distended with positive bowel sounds. No rebound or guarding. Musculoskeletal: 2+ pitting edema on the RLE and 1+ on the LLE Neurologic: Normal speech and language. Face is symmetric. Moving all extremities. No  gross focal neurologic deficits are appreciated. Skin: Skin is warm, dry and intact. No rash noted. Psychiatric: Mood and affect are normal. Speech and behavior are normal.  ____________________________________________   LABS (all labs ordered are listed, but only abnormal results are displayed)  Labs Reviewed  BASIC METABOLIC PANEL - Abnormal; Notable for the following components:      Result Value   Glucose, Bld 105 (*)    Creatinine, Ser 1.44 (*)    Calcium 8.1 (*)    GFR calc non Af Amer 49 (*)    GFR calc Af Amer 57 (*)    All other components within normal limits  CBC - Abnormal; Notable for the following components:   RBC 4.23 (*)    Hemoglobin 11.1 (*)    HCT 33.9 (*)    RDW  18.5 (*)    All other components within normal limits  BASIC METABOLIC PANEL - Abnormal; Notable for the following components:   CO2 33 (*)    Glucose, Bld 108 (*)    Creatinine, Ser 1.36 (*)    Calcium 8.2 (*)    GFR calc non Af Amer 52 (*)    All other components within normal limits  LIPID PANEL - Abnormal; Notable for the following components:   Cholesterol 217 (*)    Triglycerides 183 (*)    HDL 39 (*)    LDL Cholesterol 141 (*)    All other components within normal limits  CBC - Abnormal; Notable for the following components:   Hemoglobin 11.6 (*)    HCT 37.0 (*)    MCH 25.2 (*)    MCHC 31.3 (*)    RDW 19.0 (*)    All other components within normal limits  GLUCOSE, CAPILLARY - Abnormal; Notable for the following components:   Glucose-Capillary 120 (*)    All other components within normal limits  GLUCOSE, CAPILLARY - Abnormal; Notable for the following components:   Glucose-Capillary 103 (*)    All other components within normal limits  GLUCOSE, CAPILLARY - Abnormal; Notable for the following components:   Glucose-Capillary 234 (*)    All other components within normal limits  FIBRIN DERIVATIVES D-DIMER ([facility] ONLY) - Abnormal; Notable for the following components:   Fibrin  derivatives D-dimer ([facility]) 1,380.12 (*)    All other components within normal limits  GLUCOSE, CAPILLARY - Abnormal; Notable for the following components:   Glucose-Capillary 113 (*)    All other components within normal limits  TROPONIN I  PROTIME-INR  TROPONIN I  TROPONIN I  TROPONIN I  GLUCOSE, CAPILLARY  MAGNESIUM  GLUCOSE, CAPILLARY  HIV ANTIBODY (ROUTINE TESTING)  BASIC METABOLIC PANEL   ____________________________________________  EKG  ED ECG REPORT I, Rudene Re, the attending physician, personally viewed and interpreted this ECG.  Normal sinus rhythm, rate of 68, first-degree AV block, left bundle branch block, normal QTC, normal axis, no ST elevations or depressions.  Unchanged from prior from March 2019 ____________________________________________  RADIOLOGY  I have personally reviewed the images performed during this visit and I agree with the Radiologist's read.   Interpretation by Radiologist:  Nm Pulmonary Perf And Vent  Result Date: 08/18/2017 CLINICAL DATA:  67 year old male with acute chest pain. EXAM: NUCLEAR MEDICINE VENTILATION - PERFUSION LUNG SCAN TECHNIQUE: Ventilation images were obtained in multiple projections using inhaled aerosol Tc-31m DTPA. Perfusion images were obtained in multiple projections after intravenous injection of Tc-49m-MAA. RADIOPHARMACEUTICALS:  27.0 mCi of Tc-44m DTPA aerosol inhalation and 4.5 mCi Tc93m-MAA IV COMPARISON:  08/17/2017 and prior chest radiographs FINDINGS: Ventilation: Central clumping identified. Patchy areas of decreased ventilation noted. Perfusion: No wedge shaped peripheral perfusion defects to suggest acute pulmonary embolism. Elevated RIGHT hemidiaphragm defect noted. No VQ mismatches are identified. IMPRESSION: 1. No very low probability of pulmonary embolus (less than 10%) Electronically Signed   By: Margarette Canada Nunez.D.   On: 08/18/2017 16:04   US Venous Img Lower Bilateral  Result Date:  08/18/2017 CLINICAL DATA:  Elevated D-dimer, BILATERAL lower extremity swelling; history diabetes mellitus, hypertension, coronary artery disease post MI, COPD EXAM: BILATERAL LOWER EXTREMITY VENOUS DOPPLER ULTRASOUND TECHNIQUE: Gray-scale sonography with graded compression, as well as color Doppler and duplex ultrasound were performed to evaluate the lower extremity deep venous systems from the level of the common femoral vein and including the common femoral, femoral, profunda  femoral, popliteal and calf veins including the posterior tibial, peroneal and gastrocnemius veins when visible. The superficial great saphenous vein was also interrogated. Spectral Doppler was utilized to evaluate flow at rest and with distal augmentation maneuvers in the common femoral, femoral and popliteal veins. COMPARISON:  None. FINDINGS: RIGHT LOWER EXTREMITY Common Femoral Vein: No evidence of thrombus. Normal compressibility, respiratory phasicity and response to augmentation. Saphenofemoral Junction: No evidence of thrombus. Normal compressibility and flow on color Doppler imaging. Profunda Femoral Vein: No evidence of thrombus. Normal compressibility and flow on color Doppler imaging. Femoral Vein: No evidence of thrombus. Normal compressibility, respiratory phasicity and response to augmentation. Popliteal Vein: No evidence of thrombus. Normal compressibility, respiratory phasicity and response to augmentation. Calf Veins: No evidence of thrombus. Normal compressibility and flow on color Doppler imaging. Superficial Great Saphenous Vein: No evidence of thrombus. Normal compressibility. Venous Reflux:  None. Other Findings:  Mild subcutaneous edema at RIGHT calf. LEFT LOWER EXTREMITY Common Femoral Vein: No evidence of thrombus. Normal compressibility, respiratory phasicity and response to augmentation. Saphenofemoral Junction: No evidence of thrombus. Normal compressibility and flow on color Doppler imaging. Profunda Femoral  Vein: No evidence of thrombus. Normal compressibility and flow on color Doppler imaging. Femoral Vein: No evidence of thrombus. Normal compressibility, respiratory phasicity and response to augmentation. Popliteal Vein: No evidence of thrombus. Normal compressibility, respiratory phasicity and response to augmentation. Calf Veins: No evidence of thrombus. Normal compressibility and flow on color Doppler imaging. Superficial Great Saphenous Vein: No evidence of thrombus. Normal compressibility. Venous Reflux:  None. Other Findings:  None. IMPRESSION: No evidence of deep venous thrombosis in either lower extremity. Electronically Signed   By: Lavonia Dana Nunez.D.   On: 08/18/2017 18:15      ____________________________________________   PROCEDURES  Procedure(s) performed: None Procedures Critical Care performed:  None ____________________________________________   INITIAL IMPRESSION / ASSESSMENT AND PLAN / ED COURSE   67 y.o. male with a history of CAD status post stent and MI, COPD, diabetes, V. tach status post AICD, HTN, and OSA who presents for evaluation of chest pain.  History of chest pain concerning for possible cardiac etiology. Exam is benign. EKG and initial cardiac labs are without evidence of ischemia. Clinical picture and evaluation not suggestive of other serious cause including pneumonia, pulmonary embolism, pneumothorax, esophageal rupture, or aortic dissection. Patient will be admitted for chest pain evaluation.  I discussed the diagnosis and plan of care with the patient and answered all his questions. He states understanding and is in agreement with admission for further work up.       As part of my medical decision making, I reviewed the following data within the [place] notes reviewed and incorporated, Labs reviewed , EKG interpreted , Old EKG reviewed, Old chart reviewed, Radiograph reviewed , Discussed with admitting physician , Notes from  prior ED visits and [place] Controlled Substance Database    Pertinent labs & imaging results that were available during my care of the patient were reviewed by me and considered in my medical decision making (see chart for details).    ____________________________________________   FINAL CLINICAL IMPRESSION(S) / ED DIAGNOSES  Final diagnoses:  Unstable angina ([place])      NEW MEDICATIONS STARTED DURING THIS VISIT:  ED Discharge Orders        Ordered    Increase activity slowly     08/18/17 0809    Diet - low sodium heart healthy     08/18/17 0809  carvedilol (COREG) 12.5 MG tablet  2 times daily with meals     08/18/17 0911    atorvastatin (LIPITOR) 40 MG tablet  Daily-1800     08/18/17 0911    amiodarone (PACERONE) 200 MG tablet  Daily     08/18/17 0911    albuterol (PROVENTIL HFA;VENTOLIN HFA) 108 (90 Base) MCG/ACT inhaler  Every 6 hours PRN     08/18/17 1326       Note:  This document was prepared using Dragon voice recognition software and may include unintentional dictation errors.    Alfred Levins, [place], MD 08/18/17 2249

## 2017-08-17 NOTE — ED Triage Notes (Signed)
Arrives via [facility] for CP and SOB. Pt reports pain is gone now. Sats 90% on RA at home. Pt reports hallucinations over last couple days. +dizziness

## 2017-08-17 NOTE — ED Notes (Signed)
Report was called to [facility] and pt updated with plan of care and transfer to floor. Pt given meal tray.

## 2017-08-17 NOTE — Progress Notes (Signed)
Advanced care plan. Purpose of the Encounter: CODE STATUS Parties in Attendance: Patient and family Patient's Decision Capacity: Good Subjective/Patient's story: Presented to the emergency room for chest pain Objective/Medical story Patient will be admitted for evaluation of chest pain Goals of care determination:  Advance care directives and goals of care discussed with the patient and family They want everything done which includes CPR and intubation if the need arises CODE STATUS: Full code Time spent discussing advanced care planning: 16 minutes

## 2017-08-18 ENCOUNTER — Observation Stay: Payer: Medicare Other

## 2017-08-18 ENCOUNTER — Observation Stay
Admit: 2017-08-18 | Discharge: 2017-08-18 | Disposition: A | Discharge status: Home / Self Care | Payer: Medicare Other | Attending: Internal Medicine | Admitting: Internal Medicine

## 2017-08-18 DIAGNOSIS — I129 Hypertensive chronic kidney disease with stage 1 through stage 4 chronic kidney disease, or unspecified chronic kidney disease: Secondary | ICD-10-CM | POA: Diagnosis present

## 2017-08-18 DIAGNOSIS — I200 Unstable angina: Secondary | ICD-10-CM | POA: Diagnosis present

## 2017-08-18 DIAGNOSIS — N183 Chronic kidney disease, stage 3 (moderate): Secondary | ICD-10-CM | POA: Diagnosis present

## 2017-08-18 DIAGNOSIS — Z955 Presence of coronary angioplasty implant and graft: Secondary | ICD-10-CM | POA: Diagnosis not present

## 2017-08-18 DIAGNOSIS — G4733 Obstructive sleep apnea (adult) (pediatric): Secondary | ICD-10-CM | POA: Diagnosis present

## 2017-08-18 DIAGNOSIS — Z87891 Personal history of nicotine dependence: Secondary | ICD-10-CM | POA: Diagnosis not present

## 2017-08-18 DIAGNOSIS — Z885 Allergy status to narcotic agent status: Secondary | ICD-10-CM | POA: Diagnosis not present

## 2017-08-18 DIAGNOSIS — I255 Ischemic cardiomyopathy: Secondary | ICD-10-CM | POA: Diagnosis present

## 2017-08-18 DIAGNOSIS — I252 Old myocardial infarction: Secondary | ICD-10-CM | POA: Diagnosis not present

## 2017-08-18 DIAGNOSIS — J9601 Acute respiratory failure with hypoxia: Secondary | ICD-10-CM

## 2017-08-18 DIAGNOSIS — J449 Chronic obstructive pulmonary disease, unspecified: Secondary | ICD-10-CM | POA: Diagnosis present

## 2017-08-18 DIAGNOSIS — I447 Left bundle-branch block, unspecified: Secondary | ICD-10-CM | POA: Diagnosis present

## 2017-08-18 DIAGNOSIS — R079 Chest pain, unspecified: Secondary | ICD-10-CM | POA: Diagnosis not present

## 2017-08-18 DIAGNOSIS — Z95810 Presence of automatic (implantable) cardiac defibrillator: Secondary | ICD-10-CM | POA: Diagnosis not present

## 2017-08-18 DIAGNOSIS — I25110 Atherosclerotic heart disease of native coronary artery with unstable angina pectoris: Secondary | ICD-10-CM | POA: Diagnosis present

## 2017-08-18 DIAGNOSIS — Z7984 Long term (current) use of oral hypoglycemic drugs: Secondary | ICD-10-CM | POA: Diagnosis not present

## 2017-08-18 DIAGNOSIS — Z7982 Long term (current) use of aspirin: Secondary | ICD-10-CM | POA: Diagnosis not present

## 2017-08-18 DIAGNOSIS — Z888 Allergy status to other drugs, medicaments and biological substances status: Secondary | ICD-10-CM | POA: Diagnosis not present

## 2017-08-18 DIAGNOSIS — E1122 Type 2 diabetes mellitus with diabetic chronic kidney disease: Secondary | ICD-10-CM | POA: Diagnosis present

## 2017-08-18 LAB — LIPID PANEL
CHOL/HDL RATIO: 5.6 ratio
Cholesterol: 217 mg/dL — ABNORMAL HIGH (ref 0–200)
HDL: 39 mg/dL — ABNORMAL LOW (ref >40)
LDL Cholesterol: 141 mg/dL — ABNORMAL HIGH (ref 0–99)
Triglycerides: 183 mg/dL — ABNORMAL HIGH (ref <150)
VLDL: 37 mg/dL (ref 0–40)

## 2017-08-18 LAB — BASIC METABOLIC PANEL
ANION GAP: 5 (ref 5–15)
BUN: 22 mg/dL (ref 8–23)
CO2: 33 mmol/L — AB (ref 22–32)
CREATININE: 1.36 mg/dL — AB (ref 0.61–1.24)
Calcium: 8.2 mg/dL — ABNORMAL LOW (ref 8.9–10.3)
Chloride: 103 mmol/L (ref 98–111)
GFR calc Af Amer: >60 mL/min (ref >60)
GFR calc non Af Amer: 52 mL/min — ABNORMAL LOW (ref >60)
Glucose, Bld: 108 mg/dL — ABNORMAL HIGH (ref 70–99)
Potassium: 4.1 mmol/L (ref 3.5–5.1)
Sodium: 141 mmol/L (ref 135–145)

## 2017-08-18 LAB — TROPONIN I: Troponin I: <0.03 ng/mL (ref <0.03)

## 2017-08-18 LAB — CBC
HCT: 37 % — ABNORMAL LOW (ref 40.0–52.0)
HEMOGLOBIN: 11.6 g/dL — AB (ref 13.0–18.0)
MCH: 25.2 pg — ABNORMAL LOW (ref 26.0–34.0)
MCHC: 31.3 g/dL — ABNORMAL LOW (ref 32.0–36.0)
MCV: 80.4 fL (ref 80.0–100.0)
PLATELETS: 266 10*3/uL (ref 150–440)
RBC: 4.6 MIL/uL (ref 4.40–5.90)
RDW: 19 % — ABNORMAL HIGH (ref 11.5–14.5)
WBC: 7.3 10*3/uL (ref 3.8–10.6)

## 2017-08-18 LAB — FIBRIN DERIVATIVES D-DIMER (ARMC ONLY): Fibrin derivatives D-dimer (ARMC): 1380.12 ng/mL (FEU) — ABNORMAL HIGH (ref 0.00–499.00)

## 2017-08-18 LAB — GLUCOSE, CAPILLARY
GLUCOSE-CAPILLARY: 103 mg/dL — AB (ref 70–99)
GLUCOSE-CAPILLARY: 120 mg/dL — AB (ref 70–99)
GLUCOSE-CAPILLARY: 234 mg/dL — AB (ref 70–99)
GLUCOSE-CAPILLARY: 72 mg/dL (ref 70–99)
Glucose-Capillary: 113 mg/dL — ABNORMAL HIGH (ref 70–99)

## 2017-08-18 LAB — MAGNESIUM: Magnesium: 2.3 mg/dL (ref 1.7–2.4)

## 2017-08-18 MED ORDER — FUROSEMIDE 10 MG/ML IJ SOLN
40.0000 mg | Freq: Two times a day (BID) | INTRAMUSCULAR | Status: AC
  Administered 2017-08-18 – 2017-08-19 (×2): 40 mg via INTRAVENOUS
  Filled 2017-08-18 (×2): qty 4

## 2017-08-18 MED ORDER — ATORVASTATIN CALCIUM 40 MG PO TABS: 40.0000 mg | ORAL_TABLET | Freq: Every day | ORAL | 1 refills | Status: AC

## 2017-08-18 MED ORDER — AMIODARONE HCL 200 MG PO TABS: 200.0000 mg | ORAL_TABLET | Freq: Every day | ORAL | Status: DC

## 2017-08-18 MED ORDER — IPRATROPIUM-ALBUTEROL 0.5-2.5 (3) MG/3ML IN SOLN
3.0000 mL | RESPIRATORY_TRACT | Status: DC
  Administered 2017-08-18 – 2017-08-19 (×6): 3 mL via RESPIRATORY_TRACT
  Filled 2017-08-18 (×6): qty 3

## 2017-08-18 MED ORDER — TECHNETIUM TO 99M ALBUMIN AGGREGATED
4.0000 | Freq: Once | INTRAVENOUS | Status: AC | PRN
  Administered 2017-08-18: 4.55 via INTRAVENOUS

## 2017-08-18 MED ORDER — TECHNETIUM TC 99M DIETHYLENETRIAME-PENTAACETIC ACID
30.0000 | Freq: Once | INTRAVENOUS | Status: AC | PRN
  Administered 2017-08-18: 27 via RESPIRATORY_TRACT

## 2017-08-18 MED ORDER — ALPRAZOLAM 0.5 MG PO TABS
0.2500 mg | ORAL_TABLET | Freq: Once | ORAL | Status: AC
  Administered 2017-08-18: 0.25 mg via ORAL
  Filled 2017-08-18: qty 1

## 2017-08-18 MED ORDER — MOMETASONE FURO-FORMOTEROL FUM 200-5 MCG/ACT IN AERO
2.0000 | INHALATION_SPRAY | Freq: Two times a day (BID) | RESPIRATORY_TRACT | Status: DC
  Administered 2017-08-18 – 2017-08-19 (×2): 2 via RESPIRATORY_TRACT
  Filled 2017-08-18: qty 8.8

## 2017-08-18 MED ORDER — ATORVASTATIN CALCIUM 20 MG PO TABS
40.0000 mg | ORAL_TABLET | Freq: Every day | ORAL | Status: DC
  Administered 2017-08-18: 40 mg via ORAL
  Filled 2017-08-18: qty 2

## 2017-08-18 MED ORDER — ALBUTEROL SULFATE HFA 108 (90 BASE) MCG/ACT IN AERS: 1.0000 | INHALATION_SPRAY | Freq: Four times a day (QID) | RESPIRATORY_TRACT | 2 refills | Status: AC | PRN

## 2017-08-18 MED ORDER — CARVEDILOL 12.5 MG PO TABS: 12.5000 mg | ORAL_TABLET | Freq: Two times a day (BID) | ORAL | 0 refills | Status: DC

## 2017-08-18 MED ORDER — CARVEDILOL 12.5 MG PO TABS
12.5000 mg | ORAL_TABLET | Freq: Two times a day (BID) | ORAL | Status: DC
  Administered 2017-08-18 (×2): 12.5 mg via ORAL
  Filled 2017-08-18 (×2): qty 1

## 2017-08-18 MED ORDER — TRAZODONE HCL 50 MG PO TABS
50.0000 mg | ORAL_TABLET | Freq: Every evening | ORAL | Status: DC | PRN
  Administered 2017-08-18 (×2): 50 mg via ORAL
  Filled 2017-08-18 (×2): qty 1

## 2017-08-18 NOTE — Progress Notes (Signed)
Pt unable to sleep. MD called. Orders placed. Will continue to monitor and assess

## 2017-08-18 NOTE — Progress Notes (Signed)
MD orders to recheck room air o2 sats/ recheck 87% on room air at rest/ 3l o2 reapplied - o2 sats 93%/ pt also complains of some dizziness/ MD made aware/ VSS/ will continue to monitor close

## 2017-08-18 NOTE — Progress Notes (Signed)
Dr. Chen notified of elevated d-dimer.  

## 2017-08-18 NOTE — Care Management (Addendum)
Placed in observation for chest pain.  Patient declining stress test due to anxiety and anticipated discharge today.  It was found that he has resting room air sat at 84%.  There is documentation of copd but does not appear that patient is on any medications for copd. Pulmonary consult is in progress.  Updated UR

## 2017-08-18 NOTE — Progress Notes (Signed)
Pt requesting med to tolerate VQ scan-Xanax order

## 2017-08-18 NOTE — Progress Notes (Signed)
*  PRELIMINARY RESULTS* Echocardiogram 2D Echocardiogram has been performed.  [facility] 08/18/2017, 6:39 PM

## 2017-08-18 NOTE — Progress Notes (Signed)
Pt refusing stress test / Dr. Bridgett Larsson made aware/ due to low 02 sats (84%) on room air/ discharge orders suspended/ will continue to monitor.

## 2017-08-18 NOTE — Consult Note (Signed)
[facility] Cardiology  CARDIOLOGY CONSULT NOTE  Patient ID: Lance Nunez MRN: [id number] DOB/AGE: [birth year] 67 y.o.  Admit date: 08/17/2017 Referring Physician Pyreddy Primary Physician [place] Primary Cardiologist Nehemiah Massed Reason for Consultation Chest pain  HPI: 67 year old male referred for evaluation of chest pain. The patient has a history of coronary artery disease, status post MIs and stents in the late 1990s, with most recent cardiac catheterization in 2006 revealing total occlusion of RCA, normal left main, and insignificant LAD and LCX. The patient also has a history of hypertension, ventricular tachycardia, status post AICD, with recent generator change-out in 2017, COPD, type II diabetes, chronic kidney disease, and OSA. The patient reports experiencing chest pain yesterday when rising from the couch and walking across the room. He described the chest pain as 5/10 in intensity, substernal, non-radiating, with possible associated diaphoresis and shortness, though the patient cannot recall at this time. The episode lasted about 5 minutes and eased off. The patient denies experiencing chest pain like this before. The patient was transported to [facility] ER. While in the ambulance, the patient had recurrent chest pain, lasting about 5 minutes. ECG revealed sinus rhythm at a rate of 68 bpm with known LBBB without acute ST or T wave abnormalities. Troponin x 3 was less than 0.03. Currently, the patient denies chest pain. He has chronic exertional shortness of breath with underlying COPD and OSA.  Review of systems complete and found to be negative unless listed above     Past Medical History:  Diagnosis Date  . COPD (chronic obstructive pulmonary disease) ([address])   . Coronary artery disease   . Diabetes mellitus without complication ([place])   . Hypertension   . Myocardial infarction [facility])    95' 97' 98'    Past Surgical History:  Procedure Laterality Date  . Fractured leg Right   .  IMPLANTABLE CARDIOVERTER DEFIBRILLATOR (ICD) GENERATOR CHANGE N/A 09/06/2015   Procedure: ICD GENERATOR CHANGE;  Surgeon: Isaias Cowman, MD;  Location: [facility] ORS;  Service: Cardiovascular;  Laterality: N/A;  . IMPLANTABLE CARDIOVERTER DEFIBRILLATOR (ICD) GENERATOR CHANGE Left 09/13/2015   Procedure: ICD GENERATOR CHANGE/REVISION;  Surgeon: Isaias Cowman, MD;  Location: [facility] ORS;  Service: Cardiovascular;  Laterality: Left;  . Stents N/A    4 cardiac stents    Medications Prior to Admission  Medication Sig Dispense Refill Last Dose  . amiodarone (PACERONE) 200 MG tablet Take 1 tablet (200 mg total) by mouth 2 (two) times daily. (Patient taking differently: Take 200 mg by mouth daily. ) 60 tablet 0 12/13/2016 at 1800  . aspirin 81 MG tablet Take 81 mg by mouth daily.   12/14/2016 at 0900  . carvedilol (COREG) 6.25 MG tablet Take 6.25 mg by mouth 2 (two) times daily with a meal.   12/14/2016 at 0900  . gabapentin (NEURONTIN) 600 MG tablet Take 600 mg by mouth 3 (three) times daily.   12/14/2016 at 0900  . gemfibrozil (LOPID) 600 MG tablet Take 600 mg by mouth daily.    12/13/2016 at 1800  . glipiZIDE (GLUCOTROL) 10 MG tablet Take 10 mg by mouth daily before breakfast.   12/14/2016 at 0900  . lovastatin (MEVACOR) 40 MG tablet Take 80 mg by mouth at bedtime.    12/14/2016 at 0900  . meclizine (ANTIVERT) 25 MG tablet Take 1 tablet (25 mg total) by mouth 3 (three) times daily as needed for dizziness. 20 tablet 0   . metFORMIN (GLUCOPHAGE) 500 MG tablet Take 500 mg by mouth daily at 6 (  six) AM.   12/14/2016 at 0900  . nitroGLYCERIN (NITROSTAT) 0.4 MG SL tablet Place 0.4 mg under the tongue every 5 (five) minutes as needed for chest pain.   Not Taking at Unknown time  . quinapril (ACCUPRIL) 10 MG tablet Take 10 mg by mouth daily.   12/14/2016 at 0900  . ranitidine (ZANTAC) 150 MG tablet Take 150 mg by mouth 2 (two) times daily as needed for heartburn.    01/14/2016 at Unknown time  .  spironolactone (ALDACTONE) 25 MG tablet Take 12.5 mg by mouth at bedtime.    12/14/2016 at 0900  . sucralfate (CARAFATE) 1 g tablet Take 1 g by mouth 3 (three) times daily.   01/14/2016 at Unknown time  . verapamil (CALAN-SR) 180 MG CR tablet Take 180 mg by mouth at bedtime.   12/14/2016 at Unknown time   Social History   Socioeconomic History  . Marital status: Widowed    Spouse name: Not on file  . Number of children: Not on file  . Years of education: Not on file  . Highest education level: Not on file  Occupational History  . Occupation: disabled  Social Needs  . Financial resource strain: Not on file  . Food insecurity:    Worry: Not on file    Inability: Not on file  . Transportation needs:    Medical: Not on file    Non-medical: Not on file  Tobacco Use  . Smoking status: Former Smoker    Last attempt to quit: 07/19/2004    Years since quitting: 13.0  . Smokeless tobacco: Never Used  Substance and Sexual Activity  . Alcohol use: No  . Drug use: No  . Sexual activity: Not on file  Lifestyle  . Physical activity:    Days per week: Not on file    Minutes per session: Not on file  . Stress: Not on file  Relationships  . Social connections:    Talks on phone: Not on file    Gets together: Not on file    Attends religious service: Not on file    Active member of club or organization: Not on file    Attends meetings of clubs or organizations: Not on file    Relationship status: Not on file  . Intimate partner violence:    Fear of current or ex partner: Not on file    Emotionally abused: Not on file    Physically abused: Not on file    Forced sexual activity: Not on file  Other Topics Concern  . Not on file  Social History Narrative  . Not on file    History reviewed. No pertinent family history.    Review of systems complete and found to be negative unless listed above      PHYSICAL EXAM  General: Well developed, well nourished, in no acute  distress HEENT:  Normocephalic and atramatic Neck:  No JVD.  Lungs: Normal effort of breathing on room air. Decreased bibasilar breath sounds Heart: HRRR . 2/6 murmur Abdomen: Bowel sounds are positive, abdomen soft  Msk:  Back normal, sitting on side of bed. Normal tone for age. Extremities: No peripheral edema.   Neuro: Alert and oriented X 3. Psych:  Good affect, responds appropriately  Labs:   Lab Results  Component Value Date   WBC 7.8 08/17/2017   HGB 11.1 (L) 08/17/2017   HCT 33.9 (L) 08/17/2017   MCV 80.2 08/17/2017   PLT 265 08/17/2017    Recent  Labs  Lab 08/17/17 1739  NA 141  K 4.0  CL 104  CO2 31  BUN 21  CREATININE 1.44*  CALCIUM 8.1*  GLUCOSE 105*   Lab Results  Component Value Date   CKTOTAL 486 (H) 12/13/2012   CKMB 9.9 (H) 12/13/2012   TROPONINI <0.03 08/18/2017    Lab Results  Component Value Date   CHOL 160 12/13/2012   CHOL 159 10/23/2011   Lab Results  Component Value Date   HDL 33 (L) 12/13/2012   HDL 30 (L) 10/23/2011   Lab Results  Component Value Date   LDLCALC 61 12/13/2012   LDLCALC SEE COMMENT 10/23/2011   Lab Results  Component Value Date   TRIG 328 (H) 12/13/2012   TRIG 568 (H) 10/23/2011   No results found for: CHOLHDL No results found for: LDLDIRECT    Radiology: Dg Chest 2 View  Result Date: 08/17/2017 CLINICAL DATA:  Chest pain and shortness of breath EXAM: CHEST - 2 VIEW COMPARISON:  05/06/2017 01/15/2016 FINDINGS: Left-sided pacing device as before. Scarring or atelectasis at the right base. No acute consolidation or pleural effusion. Stable mildly enlarged cardiomediastinal silhouette. No pneumothorax. IMPRESSION: No active cardiopulmonary disease. Linear scarring or atelectasis at the right base. Electronically Signed   By: Donavan Foil M.D.   On: 08/17/2017 18:26    EKG: Sinus rhythm, known LBBB   ASSESSMENT AND PLAN:  1. Chest pain, without a known history of CAD, status post MIs and stents of LAD and PDA  in 1990s, troponin x 3 <0.03, ECG without evidence of ischemia.  2. Ischemic cardiomyopathy, EF 35% per echo in 2016 3. COPD 4. Hypertension 5. History of VTach, status post AICD, 2017, on amiodarone. Most recent interrogation in 05/2017 6. Type II diabetes 7. OSA 8. Known LBBB   Plan: 1. Lexiscan Myoview this morning 2. 2D echocardiogram   Signed: Clabe Seal PA-C 08/18/2017, 7:53 AM

## 2017-08-18 NOTE — Consult Note (Signed)
[facility] Consultation      Assessment and Plan:  Acute hypoxic respiratory failure. - Given history of ischemic cardiomyopathy, patient may have some degree of volume overload/pulmonary edema.  Will give a dose of Lasix to see if his hypoxia improves.  Chest pain. -Patient initially presented to the ED with chest pain, though this appears to have resolved, troponins negative, patient has refused further cardiac work-up with Lexiscan. - Will order stat d-dimer.  COPD. - History of smoking with suspected COPD. -Continue empiric DuoNeb. - We will need outpatient pulmonary function test.   Date: 08/18/2017  MRN# [id number] Lance Nunez 05/31/1950  Referring Physician: Dr. Bridgett Larsson.   Lance Nunez is a 67 y.o. old male seen in consultation for chief complaint of:    Chief Complaint  Patient presents with  . Chest Pain    HPI:  The patient is a 67 year old male, history of coronary artery disease, status post MI, ischemic cardia myopathy, COPD, diabetes mellitus, V. tach status post AICD, OSA who presented to the hospital on 08/17/2017 with chest pain.  The patient was admitted to the hospital, troponins were negative.  Patient was seen by cardiology, Carlton Adam was ordered, however the patient declined. Since the patient's admission he has been on room air for most of the time, last night he was noted the patient's oxygen saturation was 84% on room air, he was then started on nasal cannula oxygen at 3 L.  **Chest x-ray 08/17/2017>> images personally reviewed, elevation seen of diaphragmatic leaflets bilaterally.  Lungs are otherwise normal.   PMHX:   Past Medical History:  Diagnosis Date  . COPD (chronic obstructive pulmonary disease) ([place])   . Coronary artery disease   . Diabetes mellitus without complication ([place])   . Hypertension   . Myocardial infarction [facility])    95' 97' 98'   Surgical Hx:  Past Surgical History:  Procedure Laterality Date  .  Fractured leg Right   . IMPLANTABLE CARDIOVERTER DEFIBRILLATOR (ICD) GENERATOR CHANGE N/A 09/06/2015   Procedure: ICD GENERATOR CHANGE;  Surgeon: Isaias Cowman, MD;  Location: [facility] ORS;  Service: Cardiovascular;  Laterality: N/A;  . IMPLANTABLE CARDIOVERTER DEFIBRILLATOR (ICD) GENERATOR CHANGE Left 09/13/2015   Procedure: ICD GENERATOR CHANGE/REVISION;  Surgeon: Isaias Cowman, MD;  Location: [facility] ORS;  Service: Cardiovascular;  Laterality: Left;  . Stents N/A    4 cardiac stents   Family Hx:  History reviewed. No pertinent family history. Social Hx:   Social History   Tobacco Use  . Smoking status: Former Smoker    Last attempt to quit: 07/19/2004    Years since quitting: 13.0  . Smokeless tobacco: Never Used  Substance Use Topics  . Alcohol use: No  . Drug use: No   Medication:    Current Facility-Administered Medications:  .  0.9 %  sodium chloride infusion, 250 mL, Intravenous, PRN, Pyreddy, Pavan, MD .  acetaminophen (TYLENOL) tablet 650 mg, 650 mg, Oral, Q4H PRN, Pyreddy, Pavan, MD .  amiodarone (PACERONE) tablet 200 mg, 200 mg, Oral, Daily, Pyreddy, Pavan, MD, 200 mg at 08/18/17 0959 .  aspirin chewable tablet 324 mg, 324 mg, Oral, NOW **OR** aspirin suppository 300 mg, 300 mg, Rectal, NOW, Pyreddy, Pavan, MD .  aspirin EC tablet 81 mg, 81 mg, Oral, Daily, Pyreddy, Pavan, MD, 81 mg at 08/18/17 0959 .  atorvastatin (LIPITOR) tablet 40 mg, 40 mg, Oral, q1800, Teodoro Spray, MD .  carvedilol (COREG) tablet 12.5 mg, 12.5 mg, Oral, BID WC, Fath,  Javier Docker, MD, 12.5 mg at 08/18/17 0959 .  heparin injection 5,000 Units, 5,000 Units, Subcutaneous, Q8H, Pyreddy, Pavan, MD, 5,000 Units at 08/17/17 2101 .  insulin aspart (novoLOG) injection 0-15 Units, 0-15 Units, Subcutaneous, TID WC, Pyreddy, Pavan, MD, 5 Units at 08/18/17 1215 .  insulin aspart (novoLOG) injection 0-5 Units, 0-5 Units, Subcutaneous, QHS, Pyreddy, Pavan, MD .  ipratropium-albuterol (DUONEB) 0.5-2.5 (3)  MG/3ML nebulizer solution 3 mL, 3 mL, Nebulization, Q4H, Demetrios Loll, MD, 3 mL at 08/18/17 1109 .  lisinopril (PRINIVIL,ZESTRIL) tablet 10 mg, 10 mg, Oral, Daily, Pyreddy, Pavan, MD, 10 mg at 08/18/17 0959 .  meclizine (ANTIVERT) tablet 25 mg, 25 mg, Oral, TID PRN, Pyreddy, Pavan, MD .  mometasone-formoterol (DULERA) 200-5 MCG/ACT inhaler 2 puff, 2 puff, Inhalation, BID, Demetrios Loll, MD .  morphine 2 MG/ML injection 2 mg, 2 mg, Intravenous, Q4H PRN, Pyreddy, Pavan, MD .  nitroGLYCERIN (NITROSTAT) SL tablet 0.4 mg, 0.4 mg, Sublingual, Q5 Min x 3 PRN, Pyreddy, Pavan, MD .  ondansetron (ZOFRAN) injection 4 mg, 4 mg, Intravenous, Q6H PRN, Pyreddy, Pavan, MD .  sodium chloride flush (NS) 0.9 % injection 3 mL, 3 mL, Intravenous, Q12H, Pyreddy, Pavan, MD, 3 mL at 08/18/17 0959 .  sodium chloride flush (NS) 0.9 % injection 3 mL, 3 mL, Intravenous, PRN, Pyreddy, Pavan, MD, 3 mL at 08/17/17 2108 .  spironolactone (ALDACTONE) tablet 12.5 mg, 12.5 mg, Oral, QHS, Pyreddy, Pavan, MD, 12.5 mg at 08/17/17 2101 .  sucralfate (CARAFATE) tablet 1 g, 1 g, Oral, TID, Pyreddy, Pavan, MD, 1 g at 08/18/17 0959 .  traZODone (DESYREL) tablet 50 mg, 50 mg, Oral, QHS PRN, Lance Coon, MD, 50 mg at 08/18/17 0032   Allergies:  Prednisone; Axid [nizatidine]; and Codeine  Review of Systems: Gen:  Denies  fever, sweats, chills HEENT: Denies blurred vision, double vision. bleeds, sore throat Cvc:  No dizziness, chest pain. Resp:   Denies cough or sputum production, shortness of breath Gi: Denies swallowing difficulty, stomach pain. Gu:  Denies bladder incontinence, burning urine Ext:   No Joint pain, stiffness. Skin: No skin rash,  hives  Endoc:  No polyuria, polydipsia. Psych: No depression, insomnia. Other:  All other systems were reviewed with the patient and were negative other that what is mentioned in the HPI.   Physical Examination:   VS: BP 114/86   Pulse (!) 56   Temp 97.6 F (36.4 C)   Resp 18   Ht 5'  4" (1.626 m)   Wt 220 lb (99.8 kg)   SpO2 93%   BMI 37.76 kg/m   General Appearance: No distress  Neuro:without focal findings,  speech normal,  HEENT: PERRLA, EOM intact.   Pulmonary: normal breath sounds, No wheezing.  CardiovascularNormal S1,S2.  No m/r/g.   Abdomen: Benign, Soft, non-tender. Renal:  No costovertebral tenderness  GU:  No performed at this time. Endoc: No evident thyromegaly, no signs of acromegaly. Skin:   warm, no rashes, no ecchymosis  Extremities: normal, no cyanosis, clubbing.  Other findings:    LABORATORY PANEL:   CBC Recent Labs  Lab 08/18/17 0802  WBC 7.3  HGB 11.6*  HCT 37.0*  PLT 266   ------------------------------------------------------------------------------------------------------------------  Chemistries  Recent Labs  Lab 08/18/17 0802  NA 141  K 4.1  CL 103  CO2 33*  GLUCOSE 108*  BUN 22  CREATININE 1.36*  CALCIUM 8.2*   ------------------------------------------------------------------------------------------------------------------  Cardiac Enzymes Recent Labs  Lab 08/18/17 0802  TROPONINI <0.03   ------------------------------------------------------------  RADIOLOGY:  Dg Chest 2 View  Result Date: 08/17/2017 CLINICAL DATA:  Chest pain and shortness of breath EXAM: CHEST - 2 VIEW COMPARISON:  05/06/2017 01/15/2016 FINDINGS: Left-sided pacing device as before. Scarring or atelectasis at the right base. No acute consolidation or pleural effusion. Stable mildly enlarged cardiomediastinal silhouette. No pneumothorax. IMPRESSION: No active cardiopulmonary disease. Linear scarring or atelectasis at the right base. Electronically Signed   By: Donavan Foil M.D.   On: 08/17/2017 18:26       Thank  you for the consultation and for allowing [facility] to assist in the care of your patient. Our recommendations are noted above.  Please contact us if we can be of further service.   Marda Stalker, M.D., F.C.C.P.  Board Certified in Internal Medicine, Pulmonary Medicine, [place], and Sleep Medicine.  [facility] Office Number: [phone]   08/18/2017

## 2017-08-18 NOTE — Progress Notes (Signed)
[facility] [place] NAME: Lance Nunez    MR#:  [id number]  DATE OF BIRTH:  20-May-1950  SUBJECTIVE:  CHIEF COMPLAINT:   Chief Complaint  Patient presents with  . Chest Pain   The patient has no complaints of chest pain.  He refused stress test.  He was found hypoxia with O2 sat decreased to 84% in room air, put on oxygen 3 L.  Patient has complaints of dizziness in the afternoon. REVIEW OF SYSTEMS:  Review of Systems  Constitutional: Negative for chills, fever and malaise/fatigue.  HENT: Negative for sore throat.   Eyes: Negative for blurred vision and double vision.  Respiratory: Negative for cough, hemoptysis, shortness of breath, wheezing and stridor.   Cardiovascular: Negative for chest pain, palpitations, orthopnea and leg swelling.  Gastrointestinal: Negative for abdominal pain, blood in stool, diarrhea, melena, nausea and vomiting.  Genitourinary: Negative for dysuria, flank pain and hematuria.  Musculoskeletal: Negative for back pain and joint pain.  Skin: Negative for rash.  Neurological: Positive for dizziness. Negative for sensory change, focal weakness, seizures, loss of consciousness, weakness and headaches.  Endo/Heme/Allergies: Negative for polydipsia.  Psychiatric/Behavioral: Negative for depression. The patient is not nervous/anxious.     DRUG ALLERGIES:   Allergies  Allergen Reactions  . Prednisone Other (See Comments)    Pt states, "I just cant take it, I dont know"  . Axid [Nizatidine]   . Codeine Itching   VITALS:  Blood pressure 114/86, pulse (!) 56, temperature 97.6 F (36.4 C), resp. rate 18, height 5\' 4"  (1.626 m), weight 220 lb (99.8 kg), SpO2 93 %. PHYSICAL EXAMINATION:  Physical Exam  Constitutional: He is oriented to person, place, and time. He appears well-developed.  Obesity.  HENT:  Head: Normocephalic.  Mouth/Throat: Oropharynx is clear and moist.  Eyes: Pupils are equal, round, and  reactive to light. Conjunctivae and EOM are normal. No scleral icterus.  Neck: Normal range of motion. Neck supple. No JVD present. No tracheal deviation present.  Cardiovascular: Normal rate, regular rhythm and normal heart sounds. Exam reveals no gallop.  No murmur heard. Pulmonary/Chest: Effort normal and breath sounds normal. No respiratory distress. He has no wheezes. He has no rales.  Abdominal: Soft. Bowel sounds are normal. He exhibits no distension. There is no tenderness. There is no rebound.  Musculoskeletal: Normal range of motion. He exhibits no edema or tenderness.  Neurological: He is alert and oriented to person, place, and time. No cranial nerve deficit.  Skin: No rash noted. No erythema.   LABORATORY PANEL:  Male CBC Recent Labs  Lab 08/18/17 0802  WBC 7.3  HGB 11.6*  HCT 37.0*  PLT 266   ------------------------------------------------------------------------------------------------------------------ Chemistries  Recent Labs  Lab 08/18/17 0802  NA 141  K 4.1  CL 103  CO2 33*  GLUCOSE 108*  BUN 22  CREATININE 1.36*  CALCIUM 8.2*   RADIOLOGY:  Dg Chest 2 View  Result Date: 08/17/2017 CLINICAL DATA:  Chest pain and shortness of breath EXAM: CHEST - 2 VIEW COMPARISON:  05/06/2017 01/15/2016 FINDINGS: Left-sided pacing device as before. Scarring or atelectasis at the right base. No acute consolidation or pleural effusion. Stable mildly enlarged cardiomediastinal silhouette. No pneumothorax. IMPRESSION: No active cardiopulmonary disease. Linear scarring or atelectasis at the right base. Electronically Signed   By: Donavan Foil M.D.   On: 08/17/2017 18:26   ASSESSMENT AND PLAN:   67 year old male patient with history of coronary disease, cardiac stent, type  2 diabetes mellitus, COPD, hypertension presented to the emergency room for chest pain.  -Chest pain, resolved. Troponin levels are normal. Start patient on aspirin, nitrates and PRN morphine Patient  refused stress test.  Per Dr. Saralyn Pilar, the patient can be discharged to home.  -Coronary disease with history of cardiac stent Continue aspirin and nitro as needed, changed to Lipitor and increased Coreg to 12.5 mg twice daily per Dr. Ubaldo Glassing.  -Type 2 diabetes mellitus Diabetic diet with sliding scale coverage with insulin  Acute respiratory failure with hypoxia with history of COPD and Emphysema.  Rule out PE. DuoNeb every 6 hours, start Dulera, VQ scan and pulmonary consult. Per Dr. Felicie Morn, check d-dimer and give Lasix.  CKD stage III.  Stable.  I discussed with Dr. Ubaldo Glassing and Dr. Felicie Morn. All the records are reviewed and case discussed with Care Management/Social Worker. Management plans discussed with the patient, family and they are in agreement.  CODE STATUS: Full Code  TOTAL TIME TAKING CARE OF THIS PATIENT: 43 minutes.   More than 50% of the time was spent in counseling/coordination of care: YES  POSSIBLE D/C IN 1-2 DAYS, DEPENDING ON CLINICAL CONDITION.   Demetrios Loll M.D on 08/18/2017 at 2:23 PM  Between 7am to 6pm - Pager - [phone]  After 6pm go to [email or web address] - [facility]

## 2017-08-18 NOTE — Progress Notes (Signed)
SATURATION QUALIFICATIONS: (This note is used to comply with regulatory documentation for home oxygen)  Patient Saturations on Room Air at Rest = 84%  Patient Saturations on Room Air while Ambulating = %  Patient Saturations on  Liters of oxygen while Ambulating = %  Please briefly explain why patient needs home oxygen: hx COPD

## 2017-08-18 NOTE — Care Management Obs Status (Signed)
[facility] NOTIFICATION   Patient Details  Name: PURVIS SIDLE MRN: [id number] Date of Birth: 1950/06/03   Medicare Observation Status Notification Given:  Yes    Katrina Stack, RN 08/18/2017, 5:17 PM

## 2017-08-19 LAB — ECHOCARDIOGRAM COMPLETE
Height: 64 in
WEIGHTICAEL: 3520 [oz_av]

## 2017-08-19 LAB — BASIC METABOLIC PANEL
ANION GAP: 11 (ref 5–15)
BUN: 28 mg/dL — AB (ref 8–23)
CHLORIDE: 97 mmol/L — AB (ref 98–111)
CO2: 34 mmol/L — ABNORMAL HIGH (ref 22–32)
Calcium: 8.3 mg/dL — ABNORMAL LOW (ref 8.9–10.3)
Creatinine, Ser: 1.7 mg/dL — ABNORMAL HIGH (ref 0.61–1.24)
GFR calc Af Amer: 46 mL/min — ABNORMAL LOW (ref >60)
GFR, EST NON AFRICAN AMERICAN: 40 mL/min — AB (ref >60)
GLUCOSE: 119 mg/dL — AB (ref 70–99)
POTASSIUM: 4.4 mmol/L (ref 3.5–5.1)
Sodium: 142 mmol/L (ref 135–145)

## 2017-08-19 LAB — GLUCOSE, CAPILLARY
GLUCOSE-CAPILLARY: 113 mg/dL — AB (ref 70–99)
Glucose-Capillary: 147 mg/dL — ABNORMAL HIGH (ref 70–99)

## 2017-08-19 LAB — HIV ANTIBODY (ROUTINE TESTING W REFLEX): HIV Screen 4th Generation wRfx: NONREACTIVE

## 2017-08-19 MED ORDER — MOMETASONE FURO-FORMOTEROL FUM 200-5 MCG/ACT IN AERO: 2.0000 | INHALATION_SPRAY | Freq: Two times a day (BID) | RESPIRATORY_TRACT | 0 refills | Status: AC

## 2017-08-19 NOTE — Progress Notes (Signed)
[facility]  SUBJECTIVE: The patient reports feeling better this morning. He denies recurrent chest pain. He is sitting up on the side of the bed drinking coffee without supplemental oxygen, denying shortness of breath.    Vitals:   08/19/17 0411 08/19/17 0429 08/19/17 0731 08/19/17 0748  BP:  (!) 121/52  100/77  Pulse:  68  73  Resp:  17  16  Temp:  98.1 F (36.7 C)  98 F (36.7 C)  TempSrc:  Oral  Oral  SpO2: 93% 91% 93% 91%  Weight:  99.3 kg (218 lb 14.4 oz)    Height:         Intake/Output Summary (Last 24 hours) at 08/19/2017 2703 Last data filed at 08/19/2017 0700 Gross per 24 hour  Intake 0 ml  Output 0 ml  Net 0 ml      PHYSICAL EXAM  General: Well developed, well nourished, in no acute distress, sitting on side of bed drinking coffee HEENT:  Normocephalic and atramatic Neck:  No JVD.  Lungs: Clear bilaterally to auscultation. Normal effort of breathing Heart: HRRR .2/6 systolic murmur  Abdomen: Bowel sounds are positive, abdomen nondistended Msk:  Back normal, sitting on side of bed. Gait not assessed. Normal strength and tone for age. Extremities: No clubbing, cyanosis or edema.   Neuro: Alert and oriented X 3. Psych:  Good affect, responds appropriately   LABS: Basic Metabolic Panel: Recent Labs    08/18/17 0802 08/18/17 1555 08/19/17 0548  NA 141  --  142  K 4.1  --  4.4  CL 103  --  97*  CO2 33*  --  34*  GLUCOSE 108*  --  119*  BUN 22  --  28*  CREATININE 1.36*  --  1.70*  CALCIUM 8.2*  --  8.3*  MG  --  2.3  --    Liver Function Tests: No results for input(s): AST, ALT, ALKPHOS, BILITOT, PROT, ALBUMIN in the last 72 hours. No results for input(s): LIPASE, AMYLASE in the last 72 hours. CBC: Recent Labs    08/17/17 1739 08/18/17 0802  WBC 7.8 7.3  HGB 11.1* 11.6*  HCT 33.9* 37.0*  MCV 80.2 80.4  PLT 265 266   Cardiac Enzymes: Recent Labs    08/17/17 2044 08/18/17 0135 08/18/17 0802  TROPONINI <0.03 <0.03 <0.03    BNP: Invalid input(s): POCBNP D-Dimer: No results for input(s): DDIMER in the last 72 hours. Hemoglobin A1C: No results for input(s): HGBA1C in the last 72 hours. Fasting Lipid Panel: Recent Labs    08/18/17 0802  CHOL 217*  HDL 39*  LDLCALC 141*  TRIG 183*  CHOLHDL 5.6   Thyroid Function Tests: No results for input(s): TSH, T4TOTAL, T3FREE, THYROIDAB in the last 72 hours.  Invalid input(s): FREET3 Anemia Panel: No results for input(s): VITAMINB12, FOLATE, FERRITIN, TIBC, IRON, RETICCTPCT in the last 72 hours.  Dg Chest 2 View  Result Date: 08/17/2017 CLINICAL DATA:  Chest pain and shortness of breath EXAM: CHEST - 2 VIEW COMPARISON:  05/06/2017 01/15/2016 FINDINGS: Left-sided pacing device as before. Scarring or atelectasis at the right base. No acute consolidation or pleural effusion. Stable mildly enlarged cardiomediastinal silhouette. No pneumothorax. IMPRESSION: No active cardiopulmonary disease. Linear scarring or atelectasis at the right base. Electronically Signed   By: Donavan Foil M.D.   On: 08/17/2017 18:26   Nm Pulmonary Perf And Vent  Result Date: 08/18/2017 CLINICAL DATA:  67 year old male with acute chest pain. EXAM: NUCLEAR MEDICINE VENTILATION - PERFUSION  LUNG SCAN TECHNIQUE: Ventilation images were obtained in multiple projections using inhaled aerosol Tc-83m DTPA. Perfusion images were obtained in multiple projections after intravenous injection of Tc-66m-MAA. RADIOPHARMACEUTICALS:  27.0 mCi of Tc-72m DTPA aerosol inhalation and 4.5 mCi Tc25m-MAA IV COMPARISON:  08/17/2017 and prior chest radiographs FINDINGS: Ventilation: Central clumping identified. Patchy areas of decreased ventilation noted. Perfusion: No wedge shaped peripheral perfusion defects to suggest acute pulmonary embolism. Elevated RIGHT hemidiaphragm defect noted. No VQ mismatches are identified. IMPRESSION: 1. No very low probability of pulmonary embolus (less than 10%) Electronically Signed   By:  Margarette Canada M.D.   On: 08/18/2017 16:04   US Venous Img Lower Bilateral  Result Date: 08/18/2017 CLINICAL DATA:  Elevated D-dimer, BILATERAL lower extremity swelling; history diabetes mellitus, hypertension, coronary artery disease post MI, COPD EXAM: BILATERAL LOWER EXTREMITY VENOUS DOPPLER ULTRASOUND TECHNIQUE: Gray-scale sonography with graded compression, as well as color Doppler and duplex ultrasound were performed to evaluate the lower extremity deep venous systems from the level of the common femoral vein and including the common femoral, femoral, profunda femoral, popliteal and calf veins including the posterior tibial, peroneal and gastrocnemius veins when visible. The superficial great saphenous vein was also interrogated. Spectral Doppler was utilized to evaluate flow at rest and with distal augmentation maneuvers in the common femoral, femoral and popliteal veins. COMPARISON:  None. FINDINGS: RIGHT LOWER EXTREMITY Common Femoral Vein: No evidence of thrombus. Normal compressibility, respiratory phasicity and response to augmentation. Saphenofemoral Junction: No evidence of thrombus. Normal compressibility and flow on color Doppler imaging. Profunda Femoral Vein: No evidence of thrombus. Normal compressibility and flow on color Doppler imaging. Femoral Vein: No evidence of thrombus. Normal compressibility, respiratory phasicity and response to augmentation. Popliteal Vein: No evidence of thrombus. Normal compressibility, respiratory phasicity and response to augmentation. Calf Veins: No evidence of thrombus. Normal compressibility and flow on color Doppler imaging. Superficial Great Saphenous Vein: No evidence of thrombus. Normal compressibility. Venous Reflux:  None. Other Findings:  Mild subcutaneous edema at RIGHT calf. LEFT LOWER EXTREMITY Common Femoral Vein: No evidence of thrombus. Normal compressibility, respiratory phasicity and response to augmentation. Saphenofemoral Junction: No evidence of  thrombus. Normal compressibility and flow on color Doppler imaging. Profunda Femoral Vein: No evidence of thrombus. Normal compressibility and flow on color Doppler imaging. Femoral Vein: No evidence of thrombus. Normal compressibility, respiratory phasicity and response to augmentation. Popliteal Vein: No evidence of thrombus. Normal compressibility, respiratory phasicity and response to augmentation. Calf Veins: No evidence of thrombus. Normal compressibility and flow on color Doppler imaging. Superficial Great Saphenous Vein: No evidence of thrombus. Normal compressibility. Venous Reflux:  None. Other Findings:  None. IMPRESSION: No evidence of deep venous thrombosis in either lower extremity. Electronically Signed   By: Lavonia Dana M.D.   On: 08/18/2017 18:15     Echo Pending  TELEMETRY: Sinus rhythm, 70 bpm  ASSESSMENT AND PLAN:  Active Problems:   Chest pain   Acute respiratory failure with hypoxemia (HCC)    1. Chest pain, with a known history of CAD, status post multiple myocardial infarctions and stents of LAD and PDA in 1990s; troponin x 3 less than 0.03, ECG without evidence of ischemia. Patient deferred stress testing due to claustrophobia and deferred anxiolytics. Patient denies recurrent chest pain. VQ scan low probability for PE. 2. Ischemic cardiomyopathy, EF 35% per echo in 2016. Repeat echo pending 3. History of V Tach, status post AICD generator change-out in 2017. On amiodarone. 4. Acute hypoxic respiratory failure, oxygen saturations were  84% on room air, possibly secondary to pulmonary edema, improved with Lasix 5. Hypertension, low normal this morning 6. COPD  Plan: 1. Hold morning carvedilol dose due to low normal blood pressure; resume afternoon dose if blood pressure permits 2. Review 2D echocardiogram 3. Continue lisinopril, spironolactone, amiodarone, aspirin, atorvastatin 4. Recommend follow-up with Dr. Ubaldo Glassing as outpatient in 1 week.    Clabe Seal,  PA-C 08/19/2017 8:21 AM

## 2017-08-19 NOTE — Progress Notes (Signed)
Per Vicente Males, [place] okay to hold carvedilol this morning due to low bp, BP: 100/77, HR 73. Will continue to monitor.

## 2017-08-19 NOTE — Discharge Instructions (Signed)
Need home O2 [place] 3 L.

## 2017-08-19 NOTE — Discharge Summary (Signed)
[place] [facility] [place] NAME: Lance Nunez    MR#:  [id number]  [place]:  1950-08-25  DATE OF ADMISSION:  08/17/2017   ADMITTING PHYSICIAN: Saundra Shelling, MD  DATE OF DISCHARGE: 08/19/2017  2:00 PM  PRIMARY CARE PHYSICIAN: Donnie Coffin, MD   ADMISSION DIAGNOSIS:  Unstable angina ([place]) [I20.0] DISCHARGE DIAGNOSIS:  Active Problems:   Chest pain   Acute respiratory failure with hypoxemia ([place])  SECONDARY DIAGNOSIS:   Past Medical History:  Diagnosis Date  . COPD (chronic obstructive pulmonary disease) ([place])   . Coronary artery disease   . Diabetes mellitus without complication ([place])   . Hypertension   . Myocardial infarction [facility])    95' 97' 98'   HOSPITAL COURSE:  67 year old male patient with history of coronary disease, cardiac stent, type 2 diabetes mellitus, COPD, hypertension presented to the emergency room for chest pain.  -Chest pain, resolved. Troponin levels are normal. Start patient on aspirin, nitrates and PRN morphine Patient refused stress test.  Per Dr. Saralyn Pilar, the patient can be discharged to home.  -Coronary disease with history of cardiac stent Continue aspirin and nitro as needed, changed to Lipitor and increased Coreg to 12.5 mg twice daily per Dr. Ubaldo Glassing.  -Type 2 diabetes mellitus Diabetic diet with sliding scale coverage with insulin  Acute respiratory failure with hypoxia with history of COPD and Emphysema.  Ruled out PE and DVT. He was treated with DuoNeb every 6 hours, Dulera, VQ scan is normal.   He still has hypoxia at 80s in room air.  He need home oxygen 3 L by nasal cannula at home.  He needd to follow-up with Dr. Felicie Morn as outpatient.  CKD stage III.  Stable. DISCHARGE CONDITIONS:  Stable, discharged home today. CONSULTS OBTAINED:  Treatment Team:  Isaias Cowman, MD Laverle Hobby, MD DRUG ALLERGIES:   Allergies  Allergen Reactions  . Prednisone Other  (See Comments)    Pt states, "I just cant take it, I dont know"  . Axid [Nizatidine]   . Codeine Itching   DISCHARGE MEDICATIONS:   Allergies as of 08/19/2017      Reactions   Prednisone Other (See Comments)   Pt states, "I just cant take it, I dont know"   Axid [nizatidine]    Codeine Itching      Medication List    STOP taking these medications   gemfibrozil 600 MG tablet Commonly known as:  LOPID   lovastatin 40 MG tablet Commonly known as:  MEVACOR     TAKE these medications   albuterol 108 (90 Base) MCG/ACT inhaler Commonly known as:  PROVENTIL HFA;VENTOLIN HFA Inhale 1-2 puffs into the lungs every 6 (six) hours as needed for wheezing or shortness of breath.   amiodarone 200 MG tablet Commonly known as:  PACERONE Take 1 tablet (200 mg total) by mouth daily.   aspirin EC 81 MG tablet Take 81 mg by mouth daily.   atorvastatin 40 MG tablet Commonly known as:  LIPITOR Take 1 tablet (40 mg total) by mouth daily at 6 PM.   carvedilol 12.5 MG tablet Commonly known as:  COREG Take 1 tablet (12.5 mg total) by mouth 2 (two) times daily with a meal. What changed:    medication strength  how much to take   glipiZIDE 10 MG 24 hr tablet Commonly known as:  GLUCOTROL XL Take 10 mg by mouth 2 (two) times daily.   meclizine 25 MG tablet  Commonly known as:  ANTIVERT Take 1 tablet (25 mg total) by mouth 3 (three) times daily as needed for dizziness.   metFORMIN 500 MG tablet Commonly known as:  GLUCOPHAGE Take 500 mg by mouth daily.   mometasone-formoterol 200-5 MCG/ACT Aero Commonly known as:  DULERA Inhale 2 puffs into the lungs 2 (two) times daily.   nitroGLYCERIN 0.4 MG SL tablet Commonly known as:  NITROSTAT Place 0.4 mg under the tongue every 5 (five) minutes as needed for chest pain.   quinapril 20 MG tablet Commonly known as:  ACCUPRIL Take 20 mg by mouth daily.   ranitidine 150 MG tablet Commonly known as:  ZANTAC Take 150 mg by mouth 2 (two) times  daily as needed for heartburn.   verapamil 180 MG CR tablet Commonly known as:  CALAN-SR Take 180 mg by mouth daily. What changed:  Another medication with the same name was removed. Continue taking this medication, and follow the directions you see here.            Durable Medical Equipment  (From admission, onward)        Start     Ordered   08/19/17 1229  For home use only DME oxygen  Once    Question Answer Comment  Mode or (Route) Nasal cannula   Liters per Minute 3   Frequency Continuous (stationary and portable oxygen unit needed)   Oxygen conserving device Yes   Oxygen delivery system Gas      08/19/17 1228       DISCHARGE INSTRUCTIONS:  See AVS.  If you experience worsening of your admission symptoms, develop shortness of breath, life threatening emergency, suicidal or homicidal thoughts you must seek medical attention immediately by calling 911 or calling your MD immediately  if symptoms less severe.  You Must read complete instructions/literature along with all the possible adverse reactions/side effects for all the Medicines you take and that have been prescribed to you. Take any new Medicines after you have completely understood and accpet all the possible adverse reactions/side effects.   Please note  You were cared for by a hospitalist during your hospital stay. If you have any questions about your discharge medications or the care you received while you were in the hospital after you are discharged, you can call the unit and asked to speak with the hospitalist on call if the hospitalist that took care of you is not available. Once you are discharged, your primary care physician will handle any further medical issues. Please note that NO REFILLS for any discharge medications will be authorized once you are discharged, as it is imperative that you return to your primary care physician (or establish a relationship with a primary care physician if you do not have  one) for your aftercare needs so that they can reassess your need for medications and monitor your lab values.    On the day of Discharge:  VITAL SIGNS:  Blood pressure 135/74, pulse 74, temperature 98 F (36.7 C), temperature source Oral, resp. rate 16, height 5\' 4"  (1.626 m), weight 218 lb 14.4 oz (99.3 kg), SpO2 90 %. PHYSICAL EXAMINATION:  GENERAL:  67 y.o.-year-old patient lying in the bed with no acute distress.  Obesity. EYES: Pupils equal, round, reactive to light and accommodation. No scleral icterus. Extraocular muscles intact.  HEENT: Head atraumatic, normocephalic. Oropharynx and nasopharynx clear.  NECK:  Supple, no jugular venous distention. No thyroid enlargement, no tenderness.  LUNGS: Normal breath sounds bilaterally, tiny wheezing,  no rales,rhonchi or crepitation. No use of accessory muscles of respiration.  CARDIOVASCULAR: S1, S2 normal. No murmurs, rubs, or gallops.  ABDOMEN: Soft, non-tender, non-distended. Bowel sounds present. No organomegaly or mass.  EXTREMITIES: No pedal edema, cyanosis, or clubbing.  NEUROLOGIC: Cranial nerves II through XII are intact. Muscle strength 5/5 in all extremities. Sensation intact. Gait not checked.  PSYCHIATRIC: The patient is alert and oriented x 3.  SKIN: No obvious rash, lesion, or ulcer.  DATA REVIEW:   CBC Recent Labs  Lab 08/18/17 0802  WBC 7.3  HGB 11.6*  HCT 37.0*  PLT 266    Chemistries  Recent Labs  Lab 08/18/17 1555 08/19/17 0548  NA  --  142  K  --  4.4  CL  --  97*  CO2  --  34*  GLUCOSE  --  119*  BUN  --  28*  CREATININE  --  1.70*  CALCIUM  --  8.3*  MG 2.3  --      Microbiology Results  Results for orders placed or performed during the hospital encounter of 08/29/15  Surgical pcr screen     Status: None   Collection Time: 08/29/15  9:14 AM  Result Value Ref Range Status   MRSA, PCR NEGATIVE NEGATIVE Final   Staphylococcus aureus NEGATIVE NEGATIVE Final    Comment:        The Xpert SA  Assay (FDA approved for NASAL specimens in patients over 34 years of age), is one component of a comprehensive surveillance program.  Test performance has been validated by [facility] for patients greater than or equal to 48 year old. It is not intended to diagnose infection nor to guide or monitor treatment.     RADIOLOGY:  Nm Pulmonary Perf And Vent  Result Date: 08/18/2017 CLINICAL DATA:  67 year old male with acute chest pain. EXAM: NUCLEAR MEDICINE VENTILATION - PERFUSION LUNG SCAN TECHNIQUE: Ventilation images were obtained in multiple projections using inhaled aerosol Tc-79m DTPA. Perfusion images were obtained in multiple projections after intravenous injection of Tc-44m-MAA. RADIOPHARMACEUTICALS:  27.0 mCi of Tc-70m DTPA aerosol inhalation and 4.5 mCi Tc43m-MAA IV COMPARISON:  08/17/2017 and prior chest radiographs FINDINGS: Ventilation: Central clumping identified. Patchy areas of decreased ventilation noted. Perfusion: No wedge shaped peripheral perfusion defects to suggest acute pulmonary embolism. Elevated RIGHT hemidiaphragm defect noted. No VQ mismatches are identified. IMPRESSION: 1. No very low probability of pulmonary embolus (less than 10%) Electronically Signed   By: Margarette Canada M.D.   On: 08/18/2017 16:04   US Venous Img Lower Bilateral  Result Date: 08/18/2017 CLINICAL DATA:  Elevated D-dimer, BILATERAL lower extremity swelling; history diabetes mellitus, hypertension, coronary artery disease post MI, COPD EXAM: BILATERAL LOWER EXTREMITY VENOUS DOPPLER ULTRASOUND TECHNIQUE: Gray-scale sonography with graded compression, as well as color Doppler and duplex ultrasound were performed to evaluate the lower extremity deep venous systems from the level of the common femoral vein and including the common femoral, femoral, profunda femoral, popliteal and calf veins including the posterior tibial, peroneal and gastrocnemius veins when visible. The superficial great saphenous vein was  also interrogated. Spectral Doppler was utilized to evaluate flow at rest and with distal augmentation maneuvers in the common femoral, femoral and popliteal veins. COMPARISON:  None. FINDINGS: RIGHT LOWER EXTREMITY Common Femoral Vein: No evidence of thrombus. Normal compressibility, respiratory phasicity and response to augmentation. Saphenofemoral Junction: No evidence of thrombus. Normal compressibility and flow on color Doppler imaging. Profunda Femoral Vein: No evidence of thrombus. Normal compressibility and  flow on color Doppler imaging. Femoral Vein: No evidence of thrombus. Normal compressibility, respiratory phasicity and response to augmentation. Popliteal Vein: No evidence of thrombus. Normal compressibility, respiratory phasicity and response to augmentation. Calf Veins: No evidence of thrombus. Normal compressibility and flow on color Doppler imaging. Superficial Great Saphenous Vein: No evidence of thrombus. Normal compressibility. Venous Reflux:  None. Other Findings:  Mild subcutaneous edema at RIGHT calf. LEFT LOWER EXTREMITY Common Femoral Vein: No evidence of thrombus. Normal compressibility, respiratory phasicity and response to augmentation. Saphenofemoral Junction: No evidence of thrombus. Normal compressibility and flow on color Doppler imaging. Profunda Femoral Vein: No evidence of thrombus. Normal compressibility and flow on color Doppler imaging. Femoral Vein: No evidence of thrombus. Normal compressibility, respiratory phasicity and response to augmentation. Popliteal Vein: No evidence of thrombus. Normal compressibility, respiratory phasicity and response to augmentation. Calf Veins: No evidence of thrombus. Normal compressibility and flow on color Doppler imaging. Superficial Great Saphenous Vein: No evidence of thrombus. Normal compressibility. Venous Reflux:  None. Other Findings:  None. IMPRESSION: No evidence of deep venous thrombosis in either lower extremity. Electronically  Signed   By: Lavonia Dana M.D.   On: 08/18/2017 18:15     Management plans discussed with the patient, family and they are in agreement.  CODE STATUS: Full Code   TOTAL TIME TAKING CARE OF THIS PATIENT: 29 minutes.    Demetrios Loll M.D on 08/19/2017 at 3:34 PM  Between 7am to 6pm - Pager - [phone]  After 6pm go to [email or web address] - Technical brewer [facility]  Office  [phone]  CC: Primary care physician; Donnie Coffin, MD   Note: This dictation was prepared with Dragon dictation along with smaller phrase technology. Any transcriptional errors that result from this process are unintentional.

## 2017-08-19 NOTE — Progress Notes (Signed)
* [facility]     Assessment and Plan: Acute hypoxic respiratory failure. - Given history of ischemic cardiomyopathy, patient may have some degree of volume overload/pulmonary edema.   -Doing better today after diuresis.  Chest pain. -Patient initially presented to the ED with chest pain, though this appears to have resolved, troponins negative, patient has refused further cardiac work-up with Lexiscan. - Stat d-dimer was positive, patient was then sent for a VQ scan which is not to be very low probability, bilateral lower externally Dopplers were negative for DVT..  COPD. - History of smoking with suspected COPD. -Continue current inhaled medications. - We will need outpatient pulmonary function test.  We will arrange follow-up in approximately 4 weeks.  Okay to DC home from respiratory standpoint.  Date: 08/19/2017  MRN# [id number] Lance Nunez Dec 25, 1950   Lance Nunez is a 67 y.o. old male seen in follow up for chief complaint of  Chief Complaint  Patient presents with  . Chest Pain     HPI:  Patient's oxygen saturation is improved, he is currently on 2 L nasal cannula, feels that breathing has improved as well.  **Chest x-ray 08/17/2017>> images personally reviewed, elevation seen of diaphragmatic leaflets bilaterally.  Lungs are otherwise normal. Split-night sleep study 12/17/2016>> obstructive sleep apnea with AHI of 12.  CPAP titration was initiated, recommended to be on CPAP of 10.   Medication:    Current Facility-Administered Medications:  .  0.9 %  sodium chloride infusion, 250 mL, Intravenous, PRN, Pyreddy, Pavan, MD .  acetaminophen (TYLENOL) tablet 650 mg, 650 mg, Oral, Q4H PRN, Pyreddy, Pavan, MD .  amiodarone (PACERONE) tablet 200 mg, 200 mg, Oral, Daily, Pyreddy, Pavan, MD, 200 mg at 08/19/17 0910 .  aspirin EC tablet 81 mg, 81 mg, Oral, Daily, Pyreddy, Pavan, MD, 81 mg at 08/19/17 0910 .  atorvastatin (LIPITOR) tablet 40  mg, 40 mg, Oral, q1800, Teodoro Spray, MD, 40 mg at 08/18/17 1857 .  carvedilol (COREG) tablet 12.5 mg, 12.5 mg, Oral, BID WC, Teodoro Spray, MD, 12.5 mg at 08/18/17 1857 .  heparin injection 5,000 Units, 5,000 Units, Subcutaneous, Q8H, Pyreddy, Pavan, MD, 5,000 Units at 08/19/17 0440 .  insulin aspart (novoLOG) injection 0-15 Units, 0-15 Units, Subcutaneous, TID WC, Pyreddy, Pavan, MD, 3 Units at 08/19/17 1157 .  insulin aspart (novoLOG) injection 0-5 Units, 0-5 Units, Subcutaneous, QHS, Pyreddy, Pavan, MD .  ipratropium-albuterol (DUONEB) 0.5-2.5 (3) MG/3ML nebulizer solution 3 mL, 3 mL, Nebulization, Q4H, Demetrios Loll, MD, 3 mL at 08/19/17 1108 .  lisinopril (PRINIVIL,ZESTRIL) tablet 10 mg, 10 mg, Oral, Daily, Pyreddy, Pavan, MD, 10 mg at 08/19/17 0911 .  meclizine (ANTIVERT) tablet 25 mg, 25 mg, Oral, TID PRN, Saundra Shelling, MD .  mometasone-formoterol (DULERA) 200-5 MCG/ACT inhaler 2 puff, 2 puff, Inhalation, BID, Demetrios Loll, MD, 2 puff at 08/19/17 0911 .  morphine 2 MG/ML injection 2 mg, 2 mg, Intravenous, Q4H PRN, Pyreddy, Pavan, MD .  nitroGLYCERIN (NITROSTAT) SL tablet 0.4 mg, 0.4 mg, Sublingual, Q5 Min x 3 PRN, Pyreddy, Pavan, MD .  ondansetron (ZOFRAN) injection 4 mg, 4 mg, Intravenous, Q6H PRN, Pyreddy, Pavan, MD .  sodium chloride flush (NS) 0.9 % injection 3 mL, 3 mL, Intravenous, Q12H, Pyreddy, Pavan, MD, 3 mL at 08/19/17 0911 .  sodium chloride flush (NS) 0.9 % injection 3 mL, 3 mL, Intravenous, PRN, Pyreddy, Pavan, MD, 3 mL at 08/17/17 2108 .  spironolactone (ALDACTONE) tablet 12.5 mg, 12.5 mg, Oral, QHS, Pyreddy,  Pavan, MD, 12.5 mg at 08/18/17 2219 .  sucralfate (CARAFATE) tablet 1 g, 1 g, Oral, TID, Pyreddy, Pavan, MD, 1 g at 08/19/17 0911 .  traZODone (DESYREL) tablet 50 mg, 50 mg, Oral, QHS PRN, Lance Coon, MD, 50 mg at 08/18/17 2227   Allergies:  Prednisone; Axid [nizatidine]; and Codeine  Review of Systems:  Constitutional: Feels well. Cardiovascular: No chest  pain.  Pulmonary: Denies dyspnea.   The remainder of systems were reviewed and were found to be negative other than what is documented in the HPI.   Physical Examination:   VS: BP 135/74 (BP Location: Left Arm)   Pulse 74   Temp 98 F (36.7 C) (Oral)   Resp 16   Ht 5\' 4"  (1.626 m)   Wt 218 lb 14.4 oz (99.3 kg)   SpO2 90%   BMI 37.57 kg/m   General Appearance: No distress  Neuro:without focal findings, mental status, speech normal, alert and oriented HEENT: PERRLA, EOM intact Pulmonary: No wheezing, No rales  CardiovascularNormal S1,S2.  No m/r/g.  Abdomen: Benign, Soft, non-tender, No masses Renal:  No costovertebral tenderness  GU:  No performed at this time. Endoc: No evident thyromegaly, no signs of acromegaly or Cushing features Skin:   warm, no rashes, no ecchymosis  Extremities: normal, no cyanosis, clubbing.     LABORATORY PANEL:   CBC Recent Labs  Lab 08/18/17 0802  WBC 7.3  HGB 11.6*  HCT 37.0*  PLT 266   ------------------------------------------------------------------------------------------------------------------  Chemistries  Recent Labs  Lab 08/18/17 1555 08/19/17 0548  NA  --  142  K  --  4.4  CL  --  97*  CO2  --  34*  GLUCOSE  --  119*  BUN  --  28*  CREATININE  --  1.70*  CALCIUM  --  8.3*  MG 2.3  --    ------------------------------------------------------------------------------------------------------------------  Cardiac Enzymes Recent Labs  Lab 08/18/17 0802  TROPONINI <0.03   ------------------------------------------------------------  RADIOLOGY:   No results found for this or any previous visit. Results for orders placed during the hospital encounter of 08/17/17  DG Chest 2 View   Narrative CLINICAL DATA:  Chest pain and shortness of breath  EXAM: CHEST - 2 VIEW  COMPARISON:  05/06/2017 01/15/2016  FINDINGS: Left-sided pacing device as before. Scarring or atelectasis at the right base. No acute  consolidation or pleural effusion. Stable mildly enlarged cardiomediastinal silhouette. No pneumothorax.  IMPRESSION: No active cardiopulmonary disease. Linear scarring or atelectasis at the right base.   Electronically Signed   By: Donavan Foil M.D.   On: 08/17/2017 18:26    ------------------------------------------------------------------------------------------------------------------  Thank  you for allowing [facility] to assist in the care of your patient. Our recommendations are noted above.  Please contact us if we can be of further service.   Marda Stalker, M.D., F.C.C.P.  Board Certified in Internal Medicine, Pulmonary Medicine, [place], and Sleep Medicine.  [facility] Office Number: [phone]  08/19/2017

## 2017-08-19 NOTE — Care Management (Signed)
Discharge to home today per Dr. Bridgett Larsson. Qualified for home oxygen. Discussed agencies with Mr. Vandehei. Ada. Floydene Flock, [employer] representative updated Shelbie Ammons RN MSN CCM Care Management [phone]

## 2017-08-19 NOTE — Progress Notes (Signed)
Lance Nunez to be D/C'd Home per MD order.  Discussed prescriptions and follow up appointments with the patient. Prescriptions given to patient, medication list explained in detail. Pt verbalized understanding. Lance Nunez aware of patient being d/c.  Allergies as of 08/19/2017      Reactions   Prednisone Other (See Comments)   Pt states, "I just cant take it, I dont know"   Axid [nizatidine]    Codeine Itching      Medication List    STOP taking these medications   gemfibrozil 600 MG tablet Commonly known as:  LOPID   lovastatin 40 MG tablet Commonly known as:  MEVACOR     TAKE these medications   albuterol 108 (90 Base) MCG/ACT inhaler Commonly known as:  PROVENTIL HFA;VENTOLIN HFA Inhale 1-2 puffs into the lungs every 6 (six) hours as needed for wheezing or shortness of breath.   amiodarone 200 MG tablet Commonly known as:  PACERONE Take 1 tablet (200 mg total) by mouth daily.   aspirin EC 81 MG tablet Take 81 mg by mouth daily.   atorvastatin 40 MG tablet Commonly known as:  LIPITOR Take 1 tablet (40 mg total) by mouth daily at 6 PM.   carvedilol 12.5 MG tablet Commonly known as:  COREG Take 1 tablet (12.5 mg total) by mouth 2 (two) times daily with a meal. What changed:    medication strength  how much to take   glipiZIDE 10 MG 24 hr tablet Commonly known as:  GLUCOTROL XL Take 10 mg by mouth 2 (two) times daily.   meclizine 25 MG tablet Commonly known as:  ANTIVERT Take 1 tablet (25 mg total) by mouth 3 (three) times daily as needed for dizziness.   metFORMIN 500 MG tablet Commonly known as:  GLUCOPHAGE Take 500 mg by mouth daily.   mometasone-formoterol 200-5 MCG/ACT Aero Commonly known as:  DULERA Inhale 2 puffs into the lungs 2 (two) times daily.   nitroGLYCERIN 0.4 MG SL tablet Commonly known as:  NITROSTAT Place 0.4 mg under the tongue every 5 (five) minutes as needed for chest pain.   quinapril 20 MG tablet Commonly known as:   ACCUPRIL Take 20 mg by mouth daily.   ranitidine 150 MG tablet Commonly known as:  ZANTAC Take 150 mg by mouth 2 (two) times daily as needed for heartburn.   verapamil 180 MG CR tablet Commonly known as:  CALAN-SR Take 180 mg by mouth daily. What changed:  Another medication with the same name was removed. Continue taking this medication, and follow the directions you see here.            Durable Medical Equipment  (From admission, onward)        Start     Ordered   08/19/17 1229  For home use only DME oxygen  Once    Question Answer Comment  Mode or (Route) Nasal cannula   Liters per Minute 3   Frequency Continuous (stationary and portable oxygen unit needed)   Oxygen conserving device Yes   Oxygen delivery system Gas      08/19/17 1228      Vitals:   08/19/17 0748 08/19/17 0910  BP: 100/77 135/74  Pulse: 73 74  Resp: 16   Temp: 98 F (36.7 C)   SpO2: 91% 90%    Tele box removed and returned. Skin clean, dry and intact without evidence of skin break down, no evidence of skin tears noted. IV catheter discontinued intact. Site  without signs and symptoms of complications. Dressing and pressure applied. Pt denies pain at this time. No complaints noted.  An After Visit Summary was printed and given to the patient. Patient escorted via [place], and D/C home via private auto.  Lance Nunez

## 2017-08-19 NOTE — Progress Notes (Signed)
SATURATION QUALIFICATIONS: (This note is used to comply with regulatory documentation for home oxygen)  Patient Saturations on Room Air at Rest = 83%  Patient Saturations on Room Air while Ambulating = n/a%  Patient Saturations on 2 Liters of oxygen while Ambulating = 90%  Please briefly explain why patient needs home oxygen: Patient refusing to wear oxygen.

## 2017-08-26 ENCOUNTER — Inpatient Hospital Stay: Payer: Medicare Other | Admitting: Pulmonary Disease

## 2017-08-27 ENCOUNTER — Encounter: Payer: Self-pay | Admitting: Pulmonary Disease

## 2017-10-01 ENCOUNTER — Ambulatory Visit: Payer: Medicare Other | Attending: Neurology

## 2017-10-01 DIAGNOSIS — E669 Obesity, unspecified: Secondary | ICD-10-CM | POA: Insufficient documentation

## 2017-10-01 DIAGNOSIS — E119 Type 2 diabetes mellitus without complications: Secondary | ICD-10-CM | POA: Insufficient documentation

## 2017-10-01 DIAGNOSIS — G4733 Obstructive sleep apnea (adult) (pediatric): Secondary | ICD-10-CM | POA: Diagnosis not present

## 2017-10-01 DIAGNOSIS — F5104 Psychophysiologic insomnia: Secondary | ICD-10-CM | POA: Diagnosis not present

## 2017-10-01 DIAGNOSIS — Z6836 Body mass index (BMI) 36.0-36.9, adult: Secondary | ICD-10-CM | POA: Insufficient documentation

## 2017-10-01 DIAGNOSIS — I10 Essential (primary) hypertension: Secondary | ICD-10-CM | POA: Diagnosis not present

## 2017-10-31 ENCOUNTER — Encounter: Payer: Self-pay | Admitting: *Deleted

## 2017-10-31 ENCOUNTER — Emergency Department: Payer: Medicare Other

## 2017-10-31 ENCOUNTER — Emergency Department
Admission: EM | Admit: 2017-10-31 | Discharge: 2017-10-31 | Disposition: A | Discharge status: Home / Self Care | Payer: Medicare Other | Attending: Emergency Medicine | Admitting: Emergency Medicine

## 2017-10-31 ENCOUNTER — Other Ambulatory Visit: Payer: Self-pay

## 2017-10-31 DIAGNOSIS — E119 Type 2 diabetes mellitus without complications: Secondary | ICD-10-CM | POA: Insufficient documentation

## 2017-10-31 DIAGNOSIS — I10 Essential (primary) hypertension: Secondary | ICD-10-CM | POA: Diagnosis not present

## 2017-10-31 DIAGNOSIS — Z79899 Other long term (current) drug therapy: Secondary | ICD-10-CM | POA: Insufficient documentation

## 2017-10-31 DIAGNOSIS — I2510 Atherosclerotic heart disease of native coronary artery without angina pectoris: Secondary | ICD-10-CM | POA: Insufficient documentation

## 2017-10-31 DIAGNOSIS — Z95810 Presence of automatic (implantable) cardiac defibrillator: Secondary | ICD-10-CM | POA: Insufficient documentation

## 2017-10-31 DIAGNOSIS — I252 Old myocardial infarction: Secondary | ICD-10-CM | POA: Diagnosis not present

## 2017-10-31 DIAGNOSIS — Z7982 Long term (current) use of aspirin: Secondary | ICD-10-CM | POA: Diagnosis not present

## 2017-10-31 DIAGNOSIS — R0789 Other chest pain: Secondary | ICD-10-CM

## 2017-10-31 DIAGNOSIS — J449 Chronic obstructive pulmonary disease, unspecified: Secondary | ICD-10-CM | POA: Insufficient documentation

## 2017-10-31 DIAGNOSIS — Z7984 Long term (current) use of oral hypoglycemic drugs: Secondary | ICD-10-CM | POA: Diagnosis not present

## 2017-10-31 DIAGNOSIS — Z87891 Personal history of nicotine dependence: Secondary | ICD-10-CM | POA: Diagnosis not present

## 2017-10-31 DIAGNOSIS — R079 Chest pain, unspecified: Secondary | ICD-10-CM | POA: Diagnosis present

## 2017-10-31 LAB — BASIC METABOLIC PANEL
ANION GAP: 7 (ref 5–15)
BUN: 22 mg/dL (ref 8–23)
CO2: 26 mmol/L (ref 22–32)
Calcium: 8.6 mg/dL — ABNORMAL LOW (ref 8.9–10.3)
Chloride: 104 mmol/L (ref 98–111)
Creatinine, Ser: 1.75 mg/dL — ABNORMAL HIGH (ref 0.61–1.24)
GFR calc Af Amer: 45 mL/min — ABNORMAL LOW (ref >60)
GFR calc non Af Amer: 39 mL/min — ABNORMAL LOW (ref >60)
GLUCOSE: 83 mg/dL (ref 70–99)
Potassium: 4 mmol/L (ref 3.5–5.1)
Sodium: 137 mmol/L (ref 135–145)

## 2017-10-31 LAB — CBC
HEMATOCRIT: 36.8 % — AB (ref 40.0–52.0)
HEMOGLOBIN: 12.3 g/dL — AB (ref 13.0–18.0)
MCH: 26.5 pg (ref 26.0–34.0)
MCHC: 33.3 g/dL (ref 32.0–36.0)
MCV: 79.5 fL — ABNORMAL LOW (ref 80.0–100.0)
Platelets: 224 10*3/uL (ref 150–440)
RBC: 4.63 MIL/uL (ref 4.40–5.90)
RDW: 20 % — ABNORMAL HIGH (ref 11.5–14.5)
WBC: 8.6 10*3/uL (ref 3.8–10.6)

## 2017-10-31 LAB — TROPONIN I: Troponin I: <0.03 ng/mL (ref <0.03)

## 2017-10-31 NOTE — ED Triage Notes (Signed)
Pt arrived via  Ems  C/o shortness of breath  approx 1  Hour   - sitting on edge of bed . Had chest  Pain at home that lasted 4- 5  mins .  Has not taken any aspirin today . Pt states he broke out in a  Sweat as well . Patient is awake and alert no pain at this time   Speaking in complete sentances

## 2017-10-31 NOTE — ED Provider Notes (Signed)
[facility] Emergency Department Provider Note ____________________________________________   First MD Initiated Contact with Patient 10/31/17 1736     (approximate)  I have reviewed the triage vital signs and the nursing notes.   HISTORY  Chief Complaint Chest Pain    HPI Lance Nunez is a 67 y.o. male with PMH as noted below including CAD status post MI as well as COPD who presents with acute onset of chest pain, described as sharp, lasting about 5 minutes, and associated with shortness of breath for about 1 hour.  Patient also reports feeling diaphoretic, but denies nausea or lightheadedness.  He states that the symptoms have almost completely subsided. He states he was in his usual state of health until this afternoon.  Past Medical History:  Diagnosis Date  . COPD (chronic obstructive pulmonary disease) ([place])   . Coronary artery disease   . Diabetes mellitus without complication ([place])   . Hypertension   . Myocardial infarction [facility])    95' 97' 98'    Patient Active Problem List   Diagnosis Date Noted  . Acute respiratory failure with hypoxemia ([place]) 08/18/2017  . Chest pain 08/17/2017  . 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 Dilated cardiomyopathy ([place]) 09/10/2015  . Diabetes mellitus without complication ([place]) 60/63/0160  . HTN, goal below 140/80 09/10/2015    Past Surgical History:  Procedure Laterality Date  . Fractured leg Right   . IMPLANTABLE CARDIOVERTER DEFIBRILLATOR (ICD) GENERATOR CHANGE N/A 09/06/2015   Procedure: ICD GENERATOR CHANGE;  Surgeon: Isaias Cowman, MD;  Location: [facility] ORS;  Service: Cardiovascular;  Laterality: N/A;  . IMPLANTABLE CARDIOVERTER DEFIBRILLATOR (ICD) GENERATOR CHANGE Left 09/13/2015   Procedure: ICD GENERATOR CHANGE/REVISION;  Surgeon: Isaias Cowman, MD;  Location: [facility] ORS;  Service: Cardiovascular;  Laterality: Left;   . Stents N/A    4 cardiac stents    Prior to Admission medications   Medication Sig Start Date End Date Taking? Authorizing Provider  amiodarone (PACERONE) 200 MG tablet Take 1 tablet (200 mg total) by mouth daily. 08/18/17  Yes Demetrios Loll, MD  aspirin EC 81 MG tablet Take 81 mg by mouth daily.   Yes [provider]  atorvastatin (LIPITOR) 40 MG tablet Take 1 tablet (40 mg total) by mouth daily at 6 PM. 08/18/17  Yes Demetrios Loll, MD  carvedilol (COREG) 12.5 MG tablet Take 1 tablet (12.5 mg total) by mouth 2 (two) times daily with a meal. Patient taking differently: Take 6.25 mg by mouth 2 (two) times daily with a meal.  08/18/17  Yes Demetrios Loll, MD  glipiZIDE (GLUCOTROL XL) 10 MG 24 hr tablet Take 10 mg by mouth 2 (two) times daily.   Yes [provider]  metFORMIN (GLUCOPHAGE) 500 MG tablet Take 500 mg by mouth daily.    Yes [provider]  mometasone-formoterol (DULERA) 200-5 MCG/ACT AERO Inhale 2 puffs into the lungs 2 (two) times daily. 08/19/17  Yes Demetrios Loll, MD  nitroGLYCERIN (NITROSTAT) 0.4 MG SL tablet Place 0.4 mg under the tongue every 5 (five) minutes as needed for chest pain.   Yes [provider]  quinapril (ACCUPRIL) 20 MG tablet Take 20 mg by mouth daily.   Yes [provider]  ranitidine (ZANTAC) 150 MG tablet Take 150 mg by mouth 2 (two) times daily as needed for heartburn.    Yes [provider]  verapamil (CALAN-SR) 180 MG CR tablet Take 180 mg by mouth daily.  Yes [provider]  albuterol (PROVENTIL HFA;VENTOLIN HFA) 108 (90 Base) MCG/ACT inhaler Inhale 1-2 puffs into the lungs every 6 (six) hours as needed for wheezing or shortness of breath. 08/18/17   Demetrios Loll, MD  meclizine (ANTIVERT) 25 MG tablet Take 1 tablet (25 mg total) by mouth 3 (three) times daily as needed for dizziness. Patient not taking: Reported on 10/31/2017 04/30/17   Lisa Roca, MD    Allergies Prednisone; Axid [nizatidine]; and  Codeine  History reviewed. No pertinent family history.  Social History Social History   Tobacco Use  . Smoking status: Former Smoker    Last attempt to quit: 07/19/2004    Years since quitting: 13.2  . Smokeless tobacco: Never Used  Substance Use Topics  . Alcohol use: No  . Drug use: No    Review of Systems  Constitutional: No fever. Eyes: No redness. ENT: No neck pain. Cardiovascular: Positive for resolved chest pain. Respiratory: Positive for resolved shortness of breath. Gastrointestinal: No nausea or vomiting.  Genitourinary: Negative for flank pain.  Musculoskeletal: Negative for back pain. Skin: Negative for rash. Neurological: Negative for headache.   ____________________________________________   PHYSICAL EXAM:  VITAL SIGNS: ED Triage Vitals  Enc Vitals Group     BP 10/31/17 1708 123/80     Pulse Rate 10/31/17 1708 70     Resp 10/31/17 1708 (!) 23     Temp 10/31/17 1708 98.6 F (37 C)     Temp Source 10/31/17 1708 Oral     SpO2 10/31/17 1708 92 %     Weight 10/31/17 1710 210 lb (95.3 kg)     Height 10/31/17 1710 5\' 4"  (1.626 m)     Head Circumference --      Peak Flow --      Pain Score 10/31/17 1710 0     Pain Loc --      Pain Edu? --      Excl. in [place]? --     Constitutional: Alert and oriented.  Relatively comfortable appearing and in no acute distress. Eyes: Conjunctivae are normal.  Head: Atraumatic. Nose: No congestion/rhinnorhea. Mouth/Throat: Mucous membranes are moist.   Neck: Normal range of motion.  Cardiovascular: Normal rate, regular rhythm. Grossly normal heart sounds.  Good peripheral circulation. Respiratory: Normal respiratory effort.  No retractions.  Decreased breath sounds bilaterally but no wheezes or rales. Gastrointestinal: Soft and nontender. No distention.  Genitourinary: No flank tenderness. Musculoskeletal: No lower extremity edema.  Extremities warm and well perfused.  Neurologic:  Normal speech and language. No  gross focal neurologic deficits are appreciated.  Skin:  Skin is warm and dry. No rash noted. Psychiatric: Mood and affect are normal. Speech and behavior are normal.  ____________________________________________   LABS (all labs ordered are listed, but only abnormal results are displayed)  Labs Reviewed  BASIC METABOLIC PANEL - Abnormal; Notable for the following components:      Result Value   Creatinine, Ser 1.75 (*)    Calcium 8.6 (*)    GFR calc non Af Amer 39 (*)    GFR calc Af Amer 45 (*)    All other components within normal limits  CBC - Abnormal; Notable for the following components:   Hemoglobin 12.3 (*)    HCT 36.8 (*)    MCV 79.5 (*)    RDW 20.0 (*)    All other components within normal limits  TROPONIN I  TROPONIN I   ____________________________________________  EKG  ED ECG REPORT I,  Arta Silence, the attending physician, personally viewed and interpreted this ECG.  Date: 10/31/2017 EKG Time: 1705 Rate: 71 Rhythm: normal sinus rhythm QRS Axis: normal Intervals: Nonspecific IVCD ST/T Wave abnormalities: normal Narrative Interpretation: no evidence of acute ischemia; no significant change when compared to EKG of 08/17/2017  ____________________________________________  RADIOLOGY  CXR: No focal infiltrate or edema  ____________________________________________   PROCEDURES  Procedure(s) performed: No  Procedures  Critical Care performed: No ____________________________________________   INITIAL IMPRESSION / ASSESSMENT AND PLAN / ED COURSE  Pertinent labs & imaging results that were available during my care of the patient were reviewed by me and considered in my medical decision making (see chart for details).  67 year old male with PMH as noted above including CAD with prior MIs approximately 20 years ago presents with chest pain and shortness of breath this afternoon which have now resolved.  The patient is asymptomatic at this  time.  On exam, he is comfortable appearing.  His vital signs are normal except for O2 saturation in the low 90s.  He has no respiratory distress.  The rest of the exam is unremarkable.  EKG shows no acute changes.  Overall I suspect most likely musculoskeletal pain versus GERD or other benign etiology.  Given the lack of EKG findings and the resolved pain I have a low suspicion for ACS on this presentation, although the patient's risk is obviously elevated.  Given the fact that the symptoms resolved and he has normal vital signs, there is no evidence of PE, aortic dissection or other vascular etiology.  Plan: Chest x-ray, basic and cardiac labs, troponin x2 and reassess.  ----------------------------------------- 9:39 PM on 10/31/2017 -----------------------------------------  First and repeat troponin are both negative.  The patient has remained pain-free during his entire ED day.  There is no evidence of cardiac etiology at this time.  He feels well and would like to go home.  I instructed him to follow-up with his cardiologist Dr. Nehemiah Massed.  Return precautions given, and he expresses understanding. ____________________________________________   FINAL CLINICAL IMPRESSION(S) / ED DIAGNOSES  Final diagnoses:  Atypical chest pain      NEW MEDICATIONS STARTED DURING THIS VISIT:  New Prescriptions   No medications on file     Note:  This document was prepared using Dragon voice recognition software and may include unintentional dictation errors.    Arta Silence, MD 10/31/17 2139

## 2017-12-13 ENCOUNTER — Emergency Department
Admission: EM | Admit: 2017-12-13 | Discharge: 2017-12-13 | Disposition: A | Discharge status: Home / Self Care | Payer: Medicare Other | Attending: Emergency Medicine | Admitting: Emergency Medicine

## 2017-12-13 ENCOUNTER — Encounter: Payer: Self-pay | Admitting: Emergency Medicine

## 2017-12-13 ENCOUNTER — Emergency Department: Payer: Medicare Other

## 2017-12-13 DIAGNOSIS — R0602 Shortness of breath: Secondary | ICD-10-CM | POA: Insufficient documentation

## 2017-12-13 DIAGNOSIS — I10 Essential (primary) hypertension: Secondary | ICD-10-CM | POA: Insufficient documentation

## 2017-12-13 DIAGNOSIS — I2510 Atherosclerotic heart disease of native coronary artery without angina pectoris: Secondary | ICD-10-CM | POA: Diagnosis not present

## 2017-12-13 DIAGNOSIS — I252 Old myocardial infarction: Secondary | ICD-10-CM | POA: Insufficient documentation

## 2017-12-13 DIAGNOSIS — Z955 Presence of coronary angioplasty implant and graft: Secondary | ICD-10-CM | POA: Insufficient documentation

## 2017-12-13 DIAGNOSIS — M62838 Other muscle spasm: Secondary | ICD-10-CM | POA: Insufficient documentation

## 2017-12-13 DIAGNOSIS — E119 Type 2 diabetes mellitus without complications: Secondary | ICD-10-CM | POA: Diagnosis not present

## 2017-12-13 DIAGNOSIS — J449 Chronic obstructive pulmonary disease, unspecified: Secondary | ICD-10-CM | POA: Insufficient documentation

## 2017-12-13 DIAGNOSIS — Z95810 Presence of automatic (implantable) cardiac defibrillator: Secondary | ICD-10-CM | POA: Diagnosis not present

## 2017-12-13 DIAGNOSIS — Z87891 Personal history of nicotine dependence: Secondary | ICD-10-CM | POA: Insufficient documentation

## 2017-12-13 DIAGNOSIS — R253 Fasciculation: Secondary | ICD-10-CM | POA: Diagnosis present

## 2017-12-13 LAB — BASIC METABOLIC PANEL
Anion gap: 12 (ref 5–15)
BUN: 25 mg/dL — AB (ref 8–23)
CO2: 25 mmol/L (ref 22–32)
Calcium: 8.4 mg/dL — ABNORMAL LOW (ref 8.9–10.3)
Chloride: 102 mmol/L (ref 98–111)
Creatinine, Ser: 1.81 mg/dL — ABNORMAL HIGH (ref 0.61–1.24)
GFR calc Af Amer: 43 mL/min — ABNORMAL LOW (ref >60)
GFR calc non Af Amer: 37 mL/min — ABNORMAL LOW (ref >60)
GLUCOSE: 184 mg/dL — AB (ref 70–99)
POTASSIUM: 4.5 mmol/L (ref 3.5–5.1)
Sodium: 139 mmol/L (ref 135–145)

## 2017-12-13 LAB — CBC
HCT: 38.8 % — ABNORMAL LOW (ref 39.0–52.0)
HEMOGLOBIN: 11.7 g/dL — AB (ref 13.0–17.0)
MCH: 25.4 pg — AB (ref 26.0–34.0)
MCHC: 30.2 g/dL (ref 30.0–36.0)
MCV: 84.2 fL (ref 80.0–100.0)
Platelets: 239 10*3/uL (ref 150–400)
RBC: 4.61 MIL/uL (ref 4.22–5.81)
RDW: 18.6 % — ABNORMAL HIGH (ref 11.5–15.5)
WBC: 8.5 10*3/uL (ref 4.0–10.5)
nRBC: 0 % (ref 0.0–0.2)

## 2017-12-13 LAB — TROPONIN I: Troponin I: <0.03 ng/mL (ref <0.03)

## 2017-12-13 LAB — BRAIN NATRIURETIC PEPTIDE: B Natriuretic Peptide: 46 pg/mL (ref 0.0–100.0)

## 2017-12-13 MED ORDER — DIAZEPAM 5 MG PO TABS: 5.0000 mg | ORAL_TABLET | Freq: Three times a day (TID) | ORAL | 0 refills | Status: DC | PRN

## 2017-12-13 NOTE — ED Notes (Signed)
Dr Williams at bedside 

## 2017-12-13 NOTE — ED Triage Notes (Signed)
Pt arrives with complaints of "leg twitching." Pt states he had two episodes where his left leg "twitched" today. In triage appears short of breath, oxygen saturation 89%. Pt placed on 2L, pt denies wearing home o2. HX of COPD. PT denies pain.

## 2017-12-13 NOTE — ED Notes (Signed)
2 unsuccessful attempts at blood draw from this RN

## 2017-12-13 NOTE — ED Notes (Signed)
While on oxygen, patient able to speak in complete sentences.

## 2017-12-13 NOTE — ED Provider Notes (Signed)
[facility] Emergency Department Provider Note       Time seen: ----------------------------------------- 6:38 PM on 12/13/2017 -----------------------------------------   I have reviewed the triage vital signs and the nursing notes.  HISTORY   Chief Complaint Shortness of Breath    HPI Lance Nunez is a 67 y.o. male with a history of COPD, coronary disease, diabetes, hypertension, MI who presents to the ED for leg twitching.  Patient states he had 2 episodes where his left leg twitch today.  Patient describes the episodes as a spasm in his upper leg.  He has had 2 of these episodes total, one last night.  He typically is supposed to wear oxygen but does not wear it all the time, does not drink a lot of water according to his family and otherwise has not been sick recently.  Past Medical History:  Diagnosis Date  . COPD (chronic obstructive pulmonary disease) ([place])   . Coronary artery disease   . Diabetes mellitus without complication ([place])   . Hypertension   . Myocardial infarction [facility])    95' 97' 98'    Patient Active Problem List   Diagnosis Date Noted  . Acute respiratory failure with hypoxemia ([place]) 08/18/2017  . Chest pain 08/17/2017  . 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 Dilated cardiomyopathy ([place]) 09/10/2015  . Diabetes mellitus without complication ([place]) 16/11/9602  . HTN, goal below 140/80 09/10/2015    Past Surgical History:  Procedure Laterality Date  . Fractured leg Right   . IMPLANTABLE CARDIOVERTER DEFIBRILLATOR (ICD) GENERATOR CHANGE N/A 09/06/2015   Procedure: ICD GENERATOR CHANGE;  Surgeon: Isaias Cowman, MD;  Location: [facility] ORS;  Service: Cardiovascular;  Laterality: N/A;  . IMPLANTABLE CARDIOVERTER DEFIBRILLATOR (ICD) GENERATOR CHANGE Left 09/13/2015   Procedure: ICD GENERATOR CHANGE/REVISION;  Surgeon: Isaias Cowman, MD;  Location:  [facility] ORS;  Service: Cardiovascular;  Laterality: Left;  . Stents N/A    4 cardiac stents    Allergies Prednisone; Axid [nizatidine]; and Codeine  Social History Social History   Tobacco Use  . Smoking status: Former Smoker    Last attempt to quit: 07/19/2004    Years since quitting: 13.4  . Smokeless tobacco: Never Used  Substance Use Topics  . Alcohol use: No  . Drug use: No   Review of Systems Constitutional: Negative for fever. Cardiovascular: Negative for chest pain. Respiratory: Positive for chronic shortness of breath Gastrointestinal: Negative for abdominal pain, vomiting and diarrhea. Musculoskeletal: Positive for left leg spasms Skin: Negative for rash. Neurological: Negative for headaches, focal weakness or numbness.  All systems negative/normal/unremarkable except as stated in the HPI  ____________________________________________   PHYSICAL EXAM:  VITAL SIGNS: ED Triage Vitals  Enc Vitals Group     BP 12/13/17 1440 117/64     Pulse Rate 12/13/17 1440 (!) 56     Resp 12/13/17 1440 14     Temp 12/13/17 1440 98.2 F (36.8 C)     Temp Source 12/13/17 1440 Oral     SpO2 12/13/17 1440 (!) 89 %     Weight 12/13/17 1440 204 lb (92.5 kg)     Height 12/13/17 1440 5\' 3"  (1.6 m)     Head Circumference --      Peak Flow --      Pain Score 12/13/17 1455 0     Pain Loc --      Pain Edu? --      Excl. in [place]? --  Constitutional: Alert and oriented. Well appearing and in no distress. Cardiovascular: Normal rate, regular rhythm. No murmurs, rubs, or gallops.  Left lower extremity with normal pulses Respiratory: Normal respiratory effort without tachypnea nor retractions.  Mild wheezing bilaterally Gastrointestinal: Soft and nontender. Normal bowel sounds Musculoskeletal: Nontender with normal range of motion in extremities. No lower extremity tenderness nor edema. Neurologic:  Normal speech and language. No gross focal neurologic deficits are appreciated.  Skin:   Skin is warm, dry and intact. No rash noted. Psychiatric: Mood and affect are normal. Speech and behavior are normal.  ____________________________________________  EKG: Interpreted by me.  Sinus bradycardia with first-degree AV block, rate is 56 bpm, wide QRS, likely septal infarct age-indeterminate, normal QT  ____________________________________________  ED COURSE:  As part of my medical decision making, I reviewed the following data within the [place] History obtained from family if available, nursing notes, old chart and ekg, as well as notes from prior ED visits. Patient presented for leg pain, we will assess with labs and imaging as indicated at this time.   Procedures ____________________________________________   LABS (pertinent positives/negatives)  Labs Reviewed  BASIC METABOLIC PANEL - Abnormal; Notable for the following components:      Result Value   Glucose, Bld 184 (*)    BUN 25 (*)    Creatinine, Ser 1.81 (*)    Calcium 8.4 (*)    GFR calc non Af Amer 37 (*)    GFR calc Af Amer 43 (*)    All other components within normal limits  CBC - Abnormal; Notable for the following components:   Hemoglobin 11.7 (*)    HCT 38.8 (*)    MCH 25.4 (*)    RDW 18.6 (*)    All other components within normal limits  TROPONIN I  BRAIN NATRIURETIC PEPTIDE  ____________________________________________  DIFFERENTIAL DIAGNOSIS   Muscle strain, spasm, electrolyte abnormality, dehydration, hypoxia  FINAL ASSESSMENT AND PLAN  Muscle spasm   Plan: The patient had presented for muscle spasms in his left leg. Patient's labs did revealed mild hypocalcemia with chronic kidney disease and mild anemia.  I think most of his symptoms are likely multifactorial.  He is being encouraged to take a multivitamin with calcium, wear his oxygen as scheduled and drink more fluid.  Overall he is cleared for outpatient follow-up.   Laurence Aly, MD   Note: This note  was generated in part or whole with voice recognition software. Voice recognition is usually quite accurate but there are transcription errors that can and very often do occur. I apologize for any typographical errors that were not detected and corrected.     Earleen Newport, MD 12/13/17 1910

## 2017-12-13 NOTE — ED Notes (Signed)
Pt refusing chest XR. Wishes to speak with MD prior.

## 2018-01-01 ENCOUNTER — Other Ambulatory Visit: Payer: Self-pay

## 2018-01-01 ENCOUNTER — Inpatient Hospital Stay
Admission: EM | Admit: 2018-01-01 | Discharge: 2018-01-06 | DRG: 190 | Disposition: A | Discharge status: Skilled Nursing Facility | Payer: Medicare Other | Attending: Internal Medicine | Admitting: Internal Medicine

## 2018-01-01 ENCOUNTER — Encounter: Payer: Self-pay | Admitting: Internal Medicine

## 2018-01-01 ENCOUNTER — Emergency Department: Payer: Medicare Other

## 2018-01-01 DIAGNOSIS — E1122 Type 2 diabetes mellitus with diabetic chronic kidney disease: Secondary | ICD-10-CM | POA: Diagnosis present

## 2018-01-01 DIAGNOSIS — G9340 Encephalopathy, unspecified: Secondary | ICD-10-CM | POA: Diagnosis present

## 2018-01-01 DIAGNOSIS — Z885 Allergy status to narcotic agent status: Secondary | ICD-10-CM

## 2018-01-01 DIAGNOSIS — E119 Type 2 diabetes mellitus without complications: Secondary | ICD-10-CM

## 2018-01-01 DIAGNOSIS — I130 Hypertensive heart and chronic kidney disease with heart failure and stage 1 through stage 4 chronic kidney disease, or unspecified chronic kidney disease: Secondary | ICD-10-CM | POA: Diagnosis present

## 2018-01-01 DIAGNOSIS — J9622 Acute and chronic respiratory failure with hypercapnia: Secondary | ICD-10-CM | POA: Diagnosis present

## 2018-01-01 DIAGNOSIS — I252 Old myocardial infarction: Secondary | ICD-10-CM

## 2018-01-01 DIAGNOSIS — I2510 Atherosclerotic heart disease of native coronary artery without angina pectoris: Secondary | ICD-10-CM | POA: Diagnosis present

## 2018-01-01 DIAGNOSIS — K219 Gastro-esophageal reflux disease without esophagitis: Secondary | ICD-10-CM | POA: Diagnosis present

## 2018-01-01 DIAGNOSIS — I10 Essential (primary) hypertension: Secondary | ICD-10-CM | POA: Diagnosis present

## 2018-01-01 DIAGNOSIS — Z87891 Personal history of nicotine dependence: Secondary | ICD-10-CM

## 2018-01-01 DIAGNOSIS — Z95810 Presence of automatic (implantable) cardiac defibrillator: Secondary | ICD-10-CM

## 2018-01-01 DIAGNOSIS — Z79899 Other long term (current) drug therapy: Secondary | ICD-10-CM

## 2018-01-01 DIAGNOSIS — J441 Chronic obstructive pulmonary disease with (acute) exacerbation: Principal | ICD-10-CM | POA: Diagnosis present

## 2018-01-01 DIAGNOSIS — Z955 Presence of coronary angioplasty implant and graft: Secondary | ICD-10-CM

## 2018-01-01 DIAGNOSIS — R42 Dizziness and giddiness: Secondary | ICD-10-CM | POA: Diagnosis present

## 2018-01-01 DIAGNOSIS — R269 Unspecified abnormalities of gait and mobility: Secondary | ICD-10-CM | POA: Diagnosis present

## 2018-01-01 DIAGNOSIS — Z7984 Long term (current) use of oral hypoglycemic drugs: Secondary | ICD-10-CM

## 2018-01-01 DIAGNOSIS — R079 Chest pain, unspecified: Secondary | ICD-10-CM

## 2018-01-01 DIAGNOSIS — Z888 Allergy status to other drugs, medicaments and biological substances status: Secondary | ICD-10-CM

## 2018-01-01 DIAGNOSIS — Z7982 Long term (current) use of aspirin: Secondary | ICD-10-CM

## 2018-01-01 DIAGNOSIS — J9621 Acute and chronic respiratory failure with hypoxia: Secondary | ICD-10-CM

## 2018-01-01 DIAGNOSIS — N183 Chronic kidney disease, stage 3 (moderate): Secondary | ICD-10-CM | POA: Diagnosis present

## 2018-01-01 DIAGNOSIS — I5022 Chronic systolic (congestive) heart failure: Secondary | ICD-10-CM | POA: Diagnosis present

## 2018-01-01 DIAGNOSIS — E875 Hyperkalemia: Secondary | ICD-10-CM | POA: Diagnosis not present

## 2018-01-01 HISTORY — DX: Chronic systolic (congestive) heart failure: I50.22

## 2018-01-01 LAB — BLOOD GAS, VENOUS
ACID-BASE EXCESS: 5.5 mmol/L — AB (ref 0.0–2.0)
BICARBONATE: 34.1 mmol/L — AB (ref 20.0–28.0)
O2 SAT: 51.1 %
PATIENT TEMPERATURE: 37
PO2 VEN: 31 mmHg — AB (ref 32.0–45.0)
pCO2, Ven: 71 mmHg (ref 44.0–60.0)
pH, Ven: 7.29 (ref 7.250–7.430)

## 2018-01-01 LAB — COMPREHENSIVE METABOLIC PANEL
ALT: 27 U/L (ref 0–44)
AST: 33 U/L (ref 15–41)
Albumin: 3.4 g/dL — ABNORMAL LOW (ref 3.5–5.0)
Alkaline Phosphatase: 63 U/L (ref 38–126)
Anion gap: 8 (ref 5–15)
BUN: 26 mg/dL — ABNORMAL HIGH (ref 8–23)
CO2: 29 mmol/L (ref 22–32)
Calcium: 8.4 mg/dL — ABNORMAL LOW (ref 8.9–10.3)
Chloride: 101 mmol/L (ref 98–111)
Creatinine, Ser: 1.87 mg/dL — ABNORMAL HIGH (ref 0.61–1.24)
GFR calc Af Amer: 41 mL/min — ABNORMAL LOW (ref >60)
GFR calc non Af Amer: 36 mL/min — ABNORMAL LOW (ref >60)
Glucose, Bld: 78 mg/dL (ref 70–99)
Potassium: 4.6 mmol/L (ref 3.5–5.1)
Sodium: 138 mmol/L (ref 135–145)
Total Bilirubin: 0.5 mg/dL (ref 0.3–1.2)
Total Protein: 6.5 g/dL (ref 6.5–8.1)

## 2018-01-01 LAB — CBC
HEMATOCRIT: 38.9 % — AB (ref 39.0–52.0)
HEMOGLOBIN: 11.8 g/dL — AB (ref 13.0–17.0)
MCH: 25.3 pg — ABNORMAL LOW (ref 26.0–34.0)
MCHC: 30.3 g/dL (ref 30.0–36.0)
MCV: 83.5 fL (ref 80.0–100.0)
NRBC: 0 % (ref 0.0–0.2)
Platelets: 219 10*3/uL (ref 150–400)
RBC: 4.66 MIL/uL (ref 4.22–5.81)
RDW: 17.8 % — AB (ref 11.5–15.5)
WBC: 8 10*3/uL (ref 4.0–10.5)

## 2018-01-01 LAB — TROPONIN I

## 2018-01-01 MED ORDER — IPRATROPIUM-ALBUTEROL 0.5-2.5 (3) MG/3ML IN SOLN
3.0000 mL | Freq: Once | RESPIRATORY_TRACT | Status: AC
  Administered 2018-01-01: 3 mL via RESPIRATORY_TRACT
  Filled 2018-01-01: qty 3

## 2018-01-01 MED ORDER — ALBUTEROL SULFATE (2.5 MG/3ML) 0.083% IN NEBU
5.0000 mg | INHALATION_SOLUTION | Freq: Once | RESPIRATORY_TRACT | Status: AC
  Administered 2018-01-01: 5 mg via RESPIRATORY_TRACT
  Filled 2018-01-01: qty 6

## 2018-01-01 MED ORDER — ASPIRIN 81 MG PO CHEW
324.0000 mg | CHEWABLE_TABLET | Freq: Once | ORAL | Status: AC
  Administered 2018-01-01: 324 mg via ORAL
  Filled 2018-01-01: qty 4

## 2018-01-01 MED ORDER — MAGNESIUM SULFATE 2 GM/50ML IV SOLN
2.0000 g | Freq: Once | INTRAVENOUS | Status: AC
  Administered 2018-01-01: 2 g via INTRAVENOUS
  Filled 2018-01-01: qty 50

## 2018-01-01 MED ORDER — METHYLPREDNISOLONE SODIUM SUCC 125 MG IJ SOLR
125.0000 mg | Freq: Once | INTRAMUSCULAR | Status: AC
  Administered 2018-01-01: 125 mg via INTRAVENOUS
  Filled 2018-01-01: qty 2

## 2018-01-01 NOTE — H&P (Signed)
[place] [facility] [place] NAME: Lance Nunez    MR#:  [id number]  DATE OF BIRTH:  1950-11-05  DATE OF ADMISSION:  01/01/2018  PRIMARY CARE PHYSICIAN: Donnie Coffin, MD   REQUESTING/REFERRING PHYSICIAN: Quentin Cornwall, MD  CHIEF COMPLAINT:   Chief Complaint  Patient presents with  . Shortness of Breath  . Chest Pain    HISTORY OF PRESENT ILLNESS:  Lance Nunez  is a 67 y.o. male who presents with chief complaint as above.  Patient had an episode tonight of acute onset chest tightness with wheezing and respiratory distress.  He came to the ED for evaluation.  Work-up here is consistent largely with COPD exacerbation.  Patient does have significant prior cardiac history with multiple heart attacks in the past.  His initial cardiac work-up in the ED was largely within normal limits.  Hospitalist were called for admission for further treatment and evaluation  PAST MEDICAL HISTORY:   Past Medical History:  Diagnosis Date  . Chronic systolic CHF (congestive heart failure) ([place]) 01/01/2018  . COPD (chronic obstructive pulmonary disease) ([place])   . Coronary artery disease   . Diabetes mellitus without complication ([place])   . Hypertension   . Myocardial infarction [facility])    95' 97' 98'     PAST SURGICAL HISTORY:   Past Surgical History:  Procedure Laterality Date  . Fractured leg Right   . IMPLANTABLE CARDIOVERTER DEFIBRILLATOR (ICD) GENERATOR CHANGE N/A 09/06/2015   Procedure: ICD GENERATOR CHANGE;  Surgeon: Isaias Cowman, MD;  Location: [facility] ORS;  Service: Cardiovascular;  Laterality: N/A;  . IMPLANTABLE CARDIOVERTER DEFIBRILLATOR (ICD) GENERATOR CHANGE Left 09/13/2015   Procedure: ICD GENERATOR CHANGE/REVISION;  Surgeon: Isaias Cowman, MD;  Location: [facility] ORS;  Service: Cardiovascular;  Laterality: Left;  . Stents N/A    4 cardiac stents     SOCIAL HISTORY:   Social History   Tobacco Use  . Smoking status:  Former Smoker    Last attempt to quit: 07/19/2004    Years since quitting: 13.4  . Smokeless tobacco: Never Used  Substance Use Topics  . Alcohol use: No     FAMILY HISTORY:  Family history reviewed and is non-contributory   DRUG ALLERGIES:   Allergies  Allergen Reactions  . Prednisone Other (See Comments)    Pt states, "I just cant take it, I dont know"  . Axid [Nizatidine]   . Codeine Itching    MEDICATIONS AT HOME:   Prior to Admission medications   Medication Sig Start Date End Date Taking? Authorizing Provider  albuterol (PROVENTIL HFA;VENTOLIN HFA) 108 (90 Base) MCG/ACT inhaler Inhale 1-2 puffs into the lungs every 6 (six) hours as needed for wheezing or shortness of breath. 08/18/17   Demetrios Loll, MD  amiodarone (PACERONE) 200 MG tablet Take 1 tablet (200 mg total) by mouth daily. 08/18/17   Demetrios Loll, MD  aspirin EC 81 MG tablet Take 81 mg by mouth daily.    [provider]  atorvastatin (LIPITOR) 40 MG tablet Take 1 tablet (40 mg total) by mouth daily at 6 PM. 08/18/17   Demetrios Loll, MD  carvedilol (COREG) 12.5 MG tablet Take 1 tablet (12.5 mg total) by mouth 2 (two) times daily with a meal. Patient taking differently: Take 6.25 mg by mouth 2 (two) times daily with a meal.  08/18/17   Demetrios Loll, MD  diazepam (VALIUM) 5 MG tablet Take 1 tablet (5 mg total) by mouth every 8 (eight) hours as  needed for muscle spasms. 12/13/17   Earleen Newport, MD  glipiZIDE (GLUCOTROL XL) 10 MG 24 hr tablet Take 10 mg by mouth 2 (two) times daily.    [provider]  meclizine (ANTIVERT) 25 MG tablet Take 1 tablet (25 mg total) by mouth 3 (three) times daily as needed for dizziness. Patient not taking: Reported on 10/31/2017 04/30/17   Lisa Roca, MD  metFORMIN (GLUCOPHAGE) 500 MG tablet Take 500 mg by mouth daily.     [provider]  mometasone-formoterol (DULERA) 200-5 MCG/ACT AERO Inhale 2 puffs into the lungs 2 (two) times daily. 08/19/17   Demetrios Loll, MD   nitroGLYCERIN (NITROSTAT) 0.4 MG SL tablet Place 0.4 mg under the tongue every 5 (five) minutes as needed for chest pain.    [provider]  quinapril (ACCUPRIL) 20 MG tablet Take 20 mg by mouth daily.    [provider]  ranitidine (ZANTAC) 150 MG tablet Take 150 mg by mouth 2 (two) times daily as needed for heartburn.     [provider]  verapamil (CALAN-SR) 180 MG CR tablet Take 180 mg by mouth daily.    [provider]    REVIEW OF SYSTEMS:  Review of Systems  Constitutional: Negative for chills, fever, malaise/fatigue and weight loss.  HENT: Negative for ear pain, hearing loss and tinnitus.   Eyes: Negative for blurred vision, double vision, pain and redness.  Respiratory: Positive for shortness of breath and wheezing. Negative for cough and hemoptysis.   Cardiovascular: Positive for chest pain. Negative for palpitations, orthopnea and leg swelling.  Gastrointestinal: Negative for abdominal pain, constipation, diarrhea, nausea and vomiting.  Genitourinary: Negative for dysuria, frequency and hematuria.  Musculoskeletal: Negative for back pain, joint pain and neck pain.  Skin:       No acne, rash, or lesions  Neurological: Negative for dizziness, tremors, focal weakness and weakness.  Endo/Heme/Allergies: Negative for polydipsia. Does not bruise/bleed easily.  Psychiatric/Behavioral: Negative for depression. The patient is not nervous/anxious and does not have insomnia.      VITAL SIGNS:   Vitals:   01/01/18 2048 01/01/18 2049 01/01/18 2100  BP:  120/73 118/77  Pulse:  70 66  Resp:  20 20  Temp:  97.9 F (36.6 C)   TempSrc:  Oral   SpO2:  94% 95%  Weight: 100.7 kg    Height: 5\' 4"  (1.626 m)     Wt Readings from Last 3 Encounters:  01/01/18 100.7 kg  12/13/17 92.5 kg  10/31/17 95.3 kg    PHYSICAL EXAMINATION:  Physical Exam  Vitals reviewed. Constitutional: He is oriented to person, place, and time. He appears well-developed and  well-nourished. No distress.  HENT:  Head: Normocephalic and atraumatic.  Mouth/Throat: Oropharynx is clear and moist.  Eyes: Pupils are equal, round, and reactive to light. Conjunctivae and EOM are normal. No scleral icterus.  Neck: Normal range of motion. Neck supple. No JVD present. No thyromegaly present.  Cardiovascular: Normal rate, regular rhythm and intact distal pulses. Exam reveals no gallop and no friction rub.  No murmur heard. Respiratory: Effort normal and breath sounds normal. No respiratory distress. He has no wheezes. He has no rales.  GI: Soft. Bowel sounds are normal. He exhibits no distension. There is no tenderness.  Musculoskeletal: Normal range of motion. He exhibits no edema.  No arthritis, no gout  Lymphadenopathy:    He has no cervical adenopathy.  Neurological: He is alert and oriented to person, place, and time.  No cranial nerve deficit.  No dysarthria, no aphasia  Skin: Skin is warm and dry. No rash noted. No erythema.  Psychiatric: He has a normal mood and affect. His behavior is normal. Judgment and thought content normal.    LABORATORY PANEL:   CBC Recent Labs  Lab 01/01/18 2052  WBC 8.0  HGB 11.8*  HCT 38.9*  PLT 219   ------------------------------------------------------------------------------------------------------------------  Chemistries  Recent Labs  Lab 01/01/18 2141  NA 138  K 4.6  CL 101  CO2 29  GLUCOSE 78  BUN 26*  CREATININE 1.87*  CALCIUM 8.4*  AST 33  ALT 27  ALKPHOS 63  BILITOT 0.5   ------------------------------------------------------------------------------------------------------------------  Cardiac Enzymes Recent Labs  Lab 01/01/18 2141  TROPONINI <0.03   ------------------------------------------------------------------------------------------------------------------  RADIOLOGY:  Dg Chest 2 View  Result Date: 01/01/2018 CLINICAL DATA:  Dyspnea EXAM: CHEST - 2 VIEW COMPARISON:  11/01/2011  FINDINGS: Stable mild cardiomegaly. Minimal aortic atherosclerosis. Atelectasis or scarring at the right lung base. No overt pulmonary edema. No effusion or pneumothorax. No pulmonary consolidations. ICD device projects over the left hemithorax with leads in the right atrium and right ventricle. Osteoarthritis of the included glenohumeral joints. IMPRESSION: No active pulmonary disease. Mild cardiomegaly with aortic atherosclerosis. Electronically Signed   By: Ashley Royalty M.D.   On: 01/01/2018 21:41    EKG:   Orders placed or performed during the hospital encounter of 01/01/18  . EKG 12-Lead  . EKG 12-Lead  . EKG 12-Lead  . EKG 12-Lead  . ED EKG  . ED EKG    IMPRESSION AND PLAN:  Principal Problem:   COPD with acute exacerbation (HCC) -IV steroids, duo nebs, home inhalers, azithromycin Active Problems:   CAD (coronary artery disease) -suspect his chest discomfort was more related to COPD exacerbation, with significant cardiac history is possible that he had some anginal pain due to transient hypoxia.  We will trend his cardiac enzymes tonight, if they remain negative I feel there is likely no further need for cardiac work-up during this admission   Diabetes mellitus without complication ([place]) -sliding scale insulin with corresponding glucose checks   HTN, goal below 140/80 -continue home meds   Chronic systolic CHF (congestive heart failure) ([place]) -work-up as above, continue home meds   GERD -home dose H2 blocker  Chart review performed and case discussed with ED provider. Labs, imaging and/or ECG reviewed by provider and discussed with patient/family. Management plans discussed with the patient and/or family.  DVT PROPHYLAXIS: SubQ lovenox   GI PROPHYLAXIS:  H2 blocker  ADMISSION STATUS: Observation  CODE STATUS: Full Code Status History    Date Active Date Inactive Code Status Order ID Comments User Context   08/17/2017 2010 08/19/2017 1718 Full Code [id number]  Saundra Shelling, MD  Inpatient   01/15/2016 0351 01/15/2016 1629 Full Code [id number]  Saundra Shelling, MD Inpatient   09/10/2015 2048 09/13/2015 2016 Full Code [id number]  Idelle Crouch, MD Inpatient   09/06/2015 1506 09/06/2015 1856 Full Code [id number]  Isaias Cowman, MD Inpatient      TOTAL TIME TAKING CARE OF THIS PATIENT: 40 minutes.   Nissa Stannard [place] 01/01/2018, 11:07 PM  [employer]  [phone]  CC: Primary care physician; Donnie Coffin, MD  Note:  This document was prepared using Dragon voice recognition software and may include unintentional dictation errors.

## 2018-01-01 NOTE — ED Triage Notes (Signed)
Pt brought in by EMS from home with shob, has hx of copd is on o2 at 3 l per [place]. EMS gave him duoneb x 1.

## 2018-01-01 NOTE — ED Provider Notes (Signed)
[facility] Emergency Department Provider Note    First MD Initiated Contact with Patient 01/01/18 2045     (approximate)  I have reviewed the triage vital signs and the nursing notes.   HISTORY  Chief Complaint Shortness of Breath and Chest Pain    HPI Lance Nunez is a 67 y.o. male presents the ER for evaluation of chest pain shortness of breath.  States that shortness of breath is been ongoing for several days.  Had chest pain roughly 45 minutes prior to arrival.  States it is brief in nature.  Denies any fevers.  No chest pain right now.  No shortness of breath right now.  Denies any activity when he was having the pain.  Denies any alleviating factors.  No exacerbating factors.  Currently states is unsure as to why he is in the ER.    Past Medical History:  Diagnosis Date  . COPD (chronic obstructive pulmonary disease) ([place])   . Coronary artery disease   . Diabetes mellitus without complication ([place])   . Hypertension   . Myocardial infarction [facility])    95' 97' 98'   No family history on file. Past Surgical History:  Procedure Laterality Date  . Fractured leg Right   . IMPLANTABLE CARDIOVERTER DEFIBRILLATOR (ICD) GENERATOR CHANGE N/A 09/06/2015   Procedure: ICD GENERATOR CHANGE;  Surgeon: Isaias Cowman, MD;  Location: [facility] ORS;  Service: Cardiovascular;  Laterality: N/A;  . IMPLANTABLE CARDIOVERTER DEFIBRILLATOR (ICD) GENERATOR CHANGE Left 09/13/2015   Procedure: ICD GENERATOR CHANGE/REVISION;  Surgeon: Isaias Cowman, MD;  Location: [facility] ORS;  Service: Cardiovascular;  Laterality: Left;  . Stents N/A    4 cardiac stents   Patient Active Problem List   Diagnosis Date Noted  . CAD (coronary artery disease) 01/01/2018  . COPD with acute exacerbation ([place]) 01/01/2018  . Acute respiratory failure with hypoxemia ([place]) 08/18/2017  . Chest pain 08/17/2017  . 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 ICD (implantable  cardioverter-defibrillator) lead failure 09/10/2015  . Dilated cardiomyopathy ([place]) 09/10/2015  . Diabetes mellitus without complication ([place]) 74/25/9563  . HTN, goal below 140/80 09/10/2015      Prior to Admission medications   Medication Sig Start Date End Date Taking? Authorizing Provider  albuterol (PROVENTIL HFA;VENTOLIN HFA) 108 (90 Base) MCG/ACT inhaler Inhale 1-2 puffs into the lungs every 6 (six) hours as needed for wheezing or shortness of breath. 08/18/17   Demetrios Loll, MD  amiodarone (PACERONE) 200 MG tablet Take 1 tablet (200 mg total) by mouth daily. 08/18/17   Demetrios Loll, MD  aspirin EC 81 MG tablet Take 81 mg by mouth daily.    [provider]  atorvastatin (LIPITOR) 40 MG tablet Take 1 tablet (40 mg total) by mouth daily at 6 PM. 08/18/17   Demetrios Loll, MD  carvedilol (COREG) 12.5 MG tablet Take 1 tablet (12.5 mg total) by mouth 2 (two) times daily with a meal. Patient taking differently: Take 6.25 mg by mouth 2 (two) times daily with a meal.  08/18/17   Demetrios Loll, MD  diazepam (VALIUM) 5 MG tablet Take 1 tablet (5 mg total) by mouth every 8 (eight) hours as needed for muscle spasms. 12/13/17   Earleen Newport, MD  glipiZIDE (GLUCOTROL XL) 10 MG 24 hr tablet Take 10 mg by mouth 2 (two) times daily.    [provider]  meclizine (ANTIVERT) 25 MG tablet Take 1 tablet (25 mg total) by mouth 3 (three) times daily as needed for dizziness.  Patient not taking: Reported on 10/31/2017 04/30/17   Lisa Roca, MD  metFORMIN (GLUCOPHAGE) 500 MG tablet Take 500 mg by mouth daily.     [provider]  mometasone-formoterol (DULERA) 200-5 MCG/ACT AERO Inhale 2 puffs into the lungs 2 (two) times daily. 08/19/17   Demetrios Loll, MD  nitroGLYCERIN (NITROSTAT) 0.4 MG SL tablet Place 0.4 mg under the tongue every 5 (five) minutes as needed for chest pain.    [provider]  quinapril (ACCUPRIL) 20 MG tablet Take 20 mg by mouth daily.    [provider]    ranitidine (ZANTAC) 150 MG tablet Take 150 mg by mouth 2 (two) times daily as needed for heartburn.     [provider]  verapamil (CALAN-SR) 180 MG CR tablet Take 180 mg by mouth daily.    [provider]    Allergies Prednisone; Axid [nizatidine]; and Codeine    Social History Social History   Tobacco Use  . Smoking status: Former Smoker    Last attempt to quit: 07/19/2004    Years since quitting: 13.4  . Smokeless tobacco: Never Used  Substance Use Topics  . Alcohol use: No  . Drug use: No    Review of Systems Patient denies headaches, rhinorrhea, blurry vision, numbness, shortness of breath, chest pain, edema, cough, abdominal pain, nausea, vomiting, diarrhea, dysuria, fevers, rashes or hallucinations unless otherwise stated above in HPI. ____________________________________________   PHYSICAL EXAM:  VITAL SIGNS: Vitals:   01/01/18 2049 01/01/18 2100  BP: 120/73 118/77  Pulse: 70 66  Resp: 20 20  Temp: 97.9 F (36.6 C)   SpO2: 94% 95%    Constitutional: Alert, on 3L [place], mild tachycardia Eyes: Conjunctivae are normal.  Head: Atraumatic. Nose: No congestion/rhinnorhea. Mouth/Throat: Mucous membranes are moist.   Neck: No stridor. Painless ROM.  Cardiovascular: Normal rate, regular rhythm. Grossly normal heart sounds.  Good peripheral circulation. Respiratory: Normal respiratory effort.  No retractions. Lungs CTAB. Gastrointestinal: Soft and nontender. No distention. No abdominal bruits. No CVA tenderness. Genitourinary:  Musculoskeletal: No lower extremity tenderness nor edema.  No joint effusions. Neurologic:  Normal speech and language. No gross focal neurologic deficits are appreciated. No facial droop Skin:  Skin is warm, dry and intact. No rash noted. Psychiatric: Mood and affect are normal. Speech and behavior are normal.  ____________________________________________   LABS (all labs ordered are listed, but only abnormal results are  displayed)  Results for orders placed or performed during the hospital encounter of 01/01/18 (from the past 24 hour(s))  CBC     Status: Abnormal   Collection Time: 01/01/18  8:52 PM  Result Value Ref Range   WBC 8.0 4.0 - 10.5 K/uL   RBC 4.66 4.22 - 5.81 MIL/uL   Hemoglobin 11.8 (L) 13.0 - 17.0 g/dL   HCT 38.9 (L) 39.0 - 52.0 %   MCV 83.5 80.0 - 100.0 fL   MCH 25.3 (L) 26.0 - 34.0 pg   MCHC 30.3 30.0 - 36.0 g/dL   RDW 17.8 (H) 11.5 - 15.5 %   Platelets 219 150 - 400 K/uL   nRBC 0.0 0.0 - 0.2 %  Blood gas, venous     Status: Abnormal   Collection Time: 01/01/18  9:02 PM  Result Value Ref Range   pH, Ven 7.29 7.250 - 7.430   pCO2, Ven 71 (HH) 44.0 - 60.0 mmHg   pO2, Ven 31.0 (LL) 32.0 - 45.0 mmHg   Bicarbonate 34.1 (H) 20.0 - 28.0 mmol/L  Acid-Base Excess 5.5 (H) 0.0 - 2.0 mmol/L   O2 Saturation 51.1 %   Patient temperature 37.0    Sample type VENOUS   Comprehensive metabolic panel     Status: Abnormal   Collection Time: 01/01/18  9:41 PM  Result Value Ref Range   Sodium 138 135 - 145 mmol/L   Potassium 4.6 3.5 - 5.1 mmol/L   Chloride 101 98 - 111 mmol/L   CO2 29 22 - 32 mmol/L   Glucose, Bld 78 70 - 99 mg/dL   BUN 26 (H) 8 - 23 mg/dL   Creatinine, Ser 1.87 (H) 0.61 - 1.24 mg/dL   Calcium 8.4 (L) 8.9 - 10.3 mg/dL   Total Protein 6.5 6.5 - 8.1 g/dL   Albumin 3.4 (L) 3.5 - 5.0 g/dL   AST 33 15 - 41 U/L   ALT 27 0 - 44 U/L   Alkaline Phosphatase 63 38 - 126 U/L   Total Bilirubin 0.5 0.3 - 1.2 mg/dL   GFR calc non Af Amer 36 (L) >60 mL/min   GFR calc Af Amer 41 (L) >60 mL/min   Anion gap 8 5 - 15  Troponin I -     Status: None   Collection Time: 01/01/18  9:41 PM  Result Value Ref Range   Troponin I <0.03 <0.03 ng/mL   ____________________________________________  EKG My review and personal interpretation at Time: 20:49   Indication: chest pain  Rate: 70  Rhythm: sinus Axis: normal Other: normal intervals, no  stemi ____________________________________________  RADIOLOGY  I personally reviewed all radiographic images ordered to evaluate for the above acute complaints and reviewed radiology reports and findings.  These findings were personally discussed with the patient.  Please see medical record for radiology report.  ____________________________________________   PROCEDURES  Procedure(s) performed:  Procedures    Critical Care performed: no ____________________________________________   INITIAL IMPRESSION / ASSESSMENT AND PLAN / ED COURSE  Pertinent labs & imaging results that were available during my care of the patient were reviewed by me and considered in my medical decision making (see chart for details).   DDX: ACS, pericarditis, esophagitis, boerhaaves, pe, dissection, pna, bronchitis, costochondritis   Dahmir R Rusher is a 67 y.o. who presents to the ED with symptoms as described above.  Does have some mild tachypnea diminished breath sounds throughout.  Not in extremis do not believe the patient requires BiPAP at this time.  No hypoxia.  Does seem slightly drowsy.  He is afebrile and otherwise Heema dynamically stable therefore will give nebulizer treatments will order EKG and enzymes given his complaint of chest pain.  Doubt PE.  Possible CHF.  Doubt pneumonia.  Clinical Course as of Jan 01 2302  Thu Jan 01, 2018  2210 Patient still complaining of some chest tightness.  States that chest pain feels similar to previous heart attacks.  Initial troponin is negative.  Patient now with some faint audible wheezing after nebulizer treatments.   [PR]  2258 Patient with persistent wheeze.  Given the chest pain do believe he benefit from hospitalization overnight for additional nebulizer treatments and serial enzymes.   [PR]    Clinical Course User Index [PR] Merlyn Lot, MD     As part of my medical decision making, I reviewed the following data within the [place] notes reviewed and incorporated, Labs reviewed, notes from prior ED visits and [place] Controlled Substance Database   ____________________________________________   FINAL CLINICAL IMPRESSION(S) / ED DIAGNOSES  Final diagnoses:  Chest pain, unspecified type  COPD exacerbation ([place])  Acute on chronic respiratory failure with hypoxia and hypercapnia (HCC)      NEW MEDICATIONS STARTED DURING THIS VISIT:  New Prescriptions   No medications on file     Note:  This document was prepared using Dragon voice recognition software and may include unintentional dictation errors.    Merlyn Lot, MD 01/01/18 [phone]

## 2018-01-02 DIAGNOSIS — I252 Old myocardial infarction: Secondary | ICD-10-CM | POA: Diagnosis not present

## 2018-01-02 DIAGNOSIS — I130 Hypertensive heart and chronic kidney disease with heart failure and stage 1 through stage 4 chronic kidney disease, or unspecified chronic kidney disease: Secondary | ICD-10-CM | POA: Diagnosis present

## 2018-01-02 DIAGNOSIS — E875 Hyperkalemia: Secondary | ICD-10-CM | POA: Diagnosis not present

## 2018-01-02 DIAGNOSIS — I2510 Atherosclerotic heart disease of native coronary artery without angina pectoris: Secondary | ICD-10-CM | POA: Diagnosis present

## 2018-01-02 DIAGNOSIS — R269 Unspecified abnormalities of gait and mobility: Secondary | ICD-10-CM | POA: Diagnosis present

## 2018-01-02 DIAGNOSIS — Z87891 Personal history of nicotine dependence: Secondary | ICD-10-CM | POA: Diagnosis not present

## 2018-01-02 DIAGNOSIS — G9340 Encephalopathy, unspecified: Secondary | ICD-10-CM | POA: Diagnosis present

## 2018-01-02 DIAGNOSIS — Z7984 Long term (current) use of oral hypoglycemic drugs: Secondary | ICD-10-CM | POA: Diagnosis not present

## 2018-01-02 DIAGNOSIS — N183 Chronic kidney disease, stage 3 (moderate): Secondary | ICD-10-CM | POA: Diagnosis present

## 2018-01-02 DIAGNOSIS — J441 Chronic obstructive pulmonary disease with (acute) exacerbation: Secondary | ICD-10-CM | POA: Diagnosis present

## 2018-01-02 DIAGNOSIS — Z7982 Long term (current) use of aspirin: Secondary | ICD-10-CM | POA: Diagnosis not present

## 2018-01-02 DIAGNOSIS — J9621 Acute and chronic respiratory failure with hypoxia: Secondary | ICD-10-CM | POA: Diagnosis present

## 2018-01-02 DIAGNOSIS — R42 Dizziness and giddiness: Secondary | ICD-10-CM | POA: Diagnosis present

## 2018-01-02 DIAGNOSIS — E1122 Type 2 diabetes mellitus with diabetic chronic kidney disease: Secondary | ICD-10-CM | POA: Diagnosis present

## 2018-01-02 DIAGNOSIS — I5022 Chronic systolic (congestive) heart failure: Secondary | ICD-10-CM | POA: Diagnosis present

## 2018-01-02 DIAGNOSIS — J9622 Acute and chronic respiratory failure with hypercapnia: Secondary | ICD-10-CM | POA: Diagnosis present

## 2018-01-02 DIAGNOSIS — K219 Gastro-esophageal reflux disease without esophagitis: Secondary | ICD-10-CM | POA: Diagnosis present

## 2018-01-02 DIAGNOSIS — Z955 Presence of coronary angioplasty implant and graft: Secondary | ICD-10-CM | POA: Diagnosis not present

## 2018-01-02 DIAGNOSIS — Z885 Allergy status to narcotic agent status: Secondary | ICD-10-CM | POA: Diagnosis not present

## 2018-01-02 DIAGNOSIS — Z95810 Presence of automatic (implantable) cardiac defibrillator: Secondary | ICD-10-CM | POA: Diagnosis not present

## 2018-01-02 DIAGNOSIS — Z79899 Other long term (current) drug therapy: Secondary | ICD-10-CM | POA: Diagnosis not present

## 2018-01-02 DIAGNOSIS — Z888 Allergy status to other drugs, medicaments and biological substances status: Secondary | ICD-10-CM | POA: Diagnosis not present

## 2018-01-02 LAB — CBC
HEMATOCRIT: 37.7 % — AB (ref 39.0–52.0)
HEMOGLOBIN: 11.5 g/dL — AB (ref 13.0–17.0)
MCH: 25.6 pg — ABNORMAL LOW (ref 26.0–34.0)
MCHC: 30.5 g/dL (ref 30.0–36.0)
MCV: 84 fL (ref 80.0–100.0)
NRBC: 0 % (ref 0.0–0.2)
Platelets: 202 10*3/uL (ref 150–400)
RBC: 4.49 MIL/uL (ref 4.22–5.81)
RDW: 17.5 % — ABNORMAL HIGH (ref 11.5–15.5)
WBC: 6.3 10*3/uL (ref 4.0–10.5)

## 2018-01-02 LAB — BASIC METABOLIC PANEL
Anion gap: 8 (ref 5–15)
Anion gap: 10 (ref 5–15)
BUN: 28 mg/dL — ABNORMAL HIGH (ref 8–23)
BUN: 30 mg/dL — ABNORMAL HIGH (ref 8–23)
CALCIUM: 8.5 mg/dL — AB (ref 8.9–10.3)
CHLORIDE: 100 mmol/L (ref 98–111)
CO2: 28 mmol/L (ref 22–32)
CO2: 27 mmol/L (ref 22–32)
CREATININE: 1.71 mg/dL — AB (ref 0.61–1.24)
Calcium: 8.4 mg/dL — ABNORMAL LOW (ref 8.9–10.3)
Chloride: 100 mmol/L (ref 98–111)
Creatinine, Ser: 1.74 mg/dL — ABNORMAL HIGH (ref 0.61–1.24)
GFR calc Af Amer: 46 mL/min — ABNORMAL LOW (ref >60)
GFR calc non Af Amer: 39 mL/min — ABNORMAL LOW (ref >60)
GFR, EST AFRICAN AMERICAN: 45 mL/min — AB (ref >60)
GFR, EST NON AFRICAN AMERICAN: 40 mL/min — AB (ref >60)
Glucose, Bld: 259 mg/dL — ABNORMAL HIGH (ref 70–99)
Glucose, Bld: 214 mg/dL — ABNORMAL HIGH (ref 70–99)
POTASSIUM: 6.1 mmol/L — AB (ref 3.5–5.1)
Potassium: 5.1 mmol/L (ref 3.5–5.1)
SODIUM: 136 mmol/L (ref 135–145)
SODIUM: 137 mmol/L (ref 135–145)

## 2018-01-02 LAB — BLOOD GAS, ARTERIAL
Acid-Base Excess: 0.2 mmol/L (ref 0.0–2.0)
Bicarbonate: 27.9 mmol/L (ref 20.0–28.0)
FIO2: 0.36
O2 SAT: 91.9 %
PATIENT TEMPERATURE: 37
PO2 ART: 71 mmHg — AB (ref 83.0–108.0)
pCO2 arterial: 58 mmHg — ABNORMAL HIGH (ref 32.0–48.0)
pH, Arterial: 7.29 — ABNORMAL LOW (ref 7.350–7.450)

## 2018-01-02 LAB — GLUCOSE, CAPILLARY
GLUCOSE-CAPILLARY: 270 mg/dL — AB (ref 70–99)
GLUCOSE-CAPILLARY: 252 mg/dL — AB (ref 70–99)
Glucose-Capillary: 195 mg/dL — ABNORMAL HIGH (ref 70–99)
Glucose-Capillary: 199 mg/dL — ABNORMAL HIGH (ref 70–99)
Glucose-Capillary: 80 mg/dL (ref 70–99)

## 2018-01-02 LAB — TROPONIN I

## 2018-01-02 MED ORDER — MOMETASONE FURO-FORMOTEROL FUM 200-5 MCG/ACT IN AERO
2.0000 | INHALATION_SPRAY | Freq: Two times a day (BID) | RESPIRATORY_TRACT | Status: DC
  Administered 2018-01-02: 2 via RESPIRATORY_TRACT
  Filled 2018-01-02: qty 8.8

## 2018-01-02 MED ORDER — ACETAMINOPHEN 325 MG PO TABS: 650.0000 mg | ORAL_TABLET | Freq: Four times a day (QID) | ORAL | Status: DC | PRN

## 2018-01-02 MED ORDER — BUDESONIDE 0.5 MG/2ML IN SUSP
0.5000 mg | Freq: Two times a day (BID) | RESPIRATORY_TRACT | Status: DC
  Administered 2018-01-02 – 2018-01-06 (×7): 0.5 mg via RESPIRATORY_TRACT
  Filled 2018-01-02 (×8): qty 2

## 2018-01-02 MED ORDER — METHYLPREDNISOLONE SODIUM SUCC 125 MG IJ SOLR
60.0000 mg | Freq: Four times a day (QID) | INTRAMUSCULAR | Status: DC
  Administered 2018-01-02 (×2): 60 mg via INTRAVENOUS
  Filled 2018-01-02 (×3): qty 2

## 2018-01-02 MED ORDER — VERAPAMIL HCL ER 180 MG PO TBCR
180.0000 mg | EXTENDED_RELEASE_TABLET | Freq: Every day | ORAL | Status: DC
  Administered 2018-01-02 – 2018-01-05 (×4): 180 mg via ORAL
  Filled 2018-01-02 (×4): qty 1

## 2018-01-02 MED ORDER — ATORVASTATIN CALCIUM 20 MG PO TABS
40.0000 mg | ORAL_TABLET | Freq: Every day | ORAL | Status: DC
  Administered 2018-01-02 – 2018-01-05 (×4): 40 mg via ORAL
  Filled 2018-01-02 (×4): qty 2

## 2018-01-02 MED ORDER — LORAZEPAM 2 MG/ML IJ SOLN
1.0000 mg | INTRAMUSCULAR | Status: DC | PRN
  Administered 2018-01-02: 1 mg via INTRAVENOUS
  Filled 2018-01-02: qty 1

## 2018-01-02 MED ORDER — METHYLPREDNISOLONE SODIUM SUCC 40 MG IJ SOLR
40.0000 mg | Freq: Two times a day (BID) | INTRAMUSCULAR | Status: DC
  Administered 2018-01-02 – 2018-01-05 (×6): 40 mg via INTRAVENOUS
  Filled 2018-01-02 (×6): qty 1

## 2018-01-02 MED ORDER — HALOPERIDOL LACTATE 5 MG/ML IJ SOLN
1.0000 mg | Freq: Four times a day (QID) | INTRAMUSCULAR | Status: DC | PRN
  Administered 2018-01-04 – 2018-01-06 (×3): 1 mg via INTRAVENOUS
  Filled 2018-01-02 (×3): qty 1

## 2018-01-02 MED ORDER — AMIODARONE HCL 200 MG PO TABS
200.0000 mg | ORAL_TABLET | Freq: Every day | ORAL | Status: DC
  Administered 2018-01-02 – 2018-01-06 (×5): 200 mg via ORAL
  Filled 2018-01-02 (×5): qty 1

## 2018-01-02 MED ORDER — QUETIAPINE FUMARATE 25 MG PO TABS
25.0000 mg | ORAL_TABLET | Freq: Every day | ORAL | Status: DC
  Administered 2018-01-02 – 2018-01-05 (×4): 25 mg via ORAL
  Filled 2018-01-02 (×4): qty 1

## 2018-01-02 MED ORDER — FAMOTIDINE 20 MG PO TABS
20.0000 mg | ORAL_TABLET | Freq: Two times a day (BID) | ORAL | Status: DC
  Administered 2018-01-02 – 2018-01-06 (×10): 20 mg via ORAL
  Filled 2018-01-02 (×10): qty 1

## 2018-01-02 MED ORDER — ONDANSETRON HCL 4 MG PO TABS: 4.0000 mg | ORAL_TABLET | Freq: Four times a day (QID) | ORAL | Status: DC | PRN

## 2018-01-02 MED ORDER — TRAZODONE HCL 100 MG PO TABS
100.0000 mg | ORAL_TABLET | Freq: Every day | ORAL | Status: DC
  Administered 2018-01-02 – 2018-01-05 (×4): 100 mg via ORAL
  Filled 2018-01-02 (×4): qty 1

## 2018-01-02 MED ORDER — GUAIFENESIN-DM 100-10 MG/5ML PO SYRP: 5.0000 mL | ORAL_SOLUTION | ORAL | Status: DC | PRN

## 2018-01-02 MED ORDER — ONDANSETRON HCL 4 MG/2ML IJ SOLN: 4.0000 mg | Freq: Four times a day (QID) | INTRAMUSCULAR | Status: DC | PRN

## 2018-01-02 MED ORDER — IPRATROPIUM-ALBUTEROL 0.5-2.5 (3) MG/3ML IN SOLN: 3.0000 mL | RESPIRATORY_TRACT | Status: DC | PRN

## 2018-01-02 MED ORDER — INSULIN ASPART 100 UNIT/ML ~~LOC~~ SOLN
0.0000 [IU] | Freq: Three times a day (TID) | SUBCUTANEOUS | Status: DC
  Administered 2018-01-02: 2 [IU] via SUBCUTANEOUS
  Administered 2018-01-02 (×2): 5 [IU] via SUBCUTANEOUS
  Administered 2018-01-03: 2 [IU] via SUBCUTANEOUS
  Administered 2018-01-03: 5 [IU] via SUBCUTANEOUS
  Administered 2018-01-03: 2 [IU] via SUBCUTANEOUS
  Administered 2018-01-04: 3 [IU] via SUBCUTANEOUS
  Administered 2018-01-04 – 2018-01-05 (×3): 2 [IU] via SUBCUTANEOUS
  Administered 2018-01-05: 7 [IU] via SUBCUTANEOUS
  Administered 2018-01-05: 1 [IU] via SUBCUTANEOUS
  Administered 2018-01-06: 3 [IU] via SUBCUTANEOUS
  Filled 2018-01-02 (×13): qty 1

## 2018-01-02 MED ORDER — ENOXAPARIN SODIUM 40 MG/0.4ML ~~LOC~~ SOLN
40.0000 mg | SUBCUTANEOUS | Status: DC
  Administered 2018-01-02 – 2018-01-05 (×4): 40 mg via SUBCUTANEOUS
  Filled 2018-01-02 (×4): qty 0.4

## 2018-01-02 MED ORDER — CARVEDILOL 6.25 MG PO TABS
6.2500 mg | ORAL_TABLET | Freq: Two times a day (BID) | ORAL | Status: AC
  Administered 2018-01-02 – 2018-01-05 (×8): 6.25 mg via ORAL
  Filled 2018-01-02 (×8): qty 1

## 2018-01-02 MED ORDER — SODIUM POLYSTYRENE SULFONATE 15 GM/60ML PO SUSP
30.0000 g | Freq: Once | ORAL | Status: AC
  Administered 2018-01-02: 30 g via ORAL
  Filled 2018-01-02: qty 120

## 2018-01-02 MED ORDER — TRAZODONE HCL 50 MG PO TABS
50.0000 mg | ORAL_TABLET | Freq: Every evening | ORAL | Status: DC | PRN
  Administered 2018-01-02: 50 mg via ORAL
  Filled 2018-01-02: qty 1

## 2018-01-02 MED ORDER — IPRATROPIUM-ALBUTEROL 0.5-2.5 (3) MG/3ML IN SOLN
3.0000 mL | Freq: Four times a day (QID) | RESPIRATORY_TRACT | Status: DC
  Administered 2018-01-02 – 2018-01-06 (×13): 3 mL via RESPIRATORY_TRACT
  Filled 2018-01-02 (×14): qty 3

## 2018-01-02 MED ORDER — AZITHROMYCIN 250 MG PO TABS
500.0000 mg | ORAL_TABLET | Freq: Every day | ORAL | Status: DC
  Administered 2018-01-02 – 2018-01-05 (×5): 500 mg via ORAL
  Filled 2018-01-02 (×5): qty 2

## 2018-01-02 MED ORDER — ACETAMINOPHEN 650 MG RE SUPP: 650.0000 mg | Freq: Four times a day (QID) | RECTAL | Status: DC | PRN

## 2018-01-02 MED ORDER — INSULIN ASPART 100 UNIT/ML ~~LOC~~ SOLN: 0.0000 [IU] | Freq: Every day | SUBCUTANEOUS | Status: DC

## 2018-01-02 MED ORDER — ASPIRIN EC 81 MG PO TBEC
81.0000 mg | DELAYED_RELEASE_TABLET | Freq: Every day | ORAL | Status: DC
  Administered 2018-01-02 – 2018-01-06 (×5): 81 mg via ORAL
  Filled 2018-01-02 (×5): qty 1

## 2018-01-02 NOTE — Progress Notes (Signed)
Notified MD of bp. Will continue to monitor and assess

## 2018-01-02 NOTE — Plan of Care (Signed)
  Problem: Education: Goal: Knowledge of General Education information will improve Description Including pain rating scale, medication(s)/side effects and non-pharmacologic comfort measures Outcome: Not Progressing Note:  Patient acutely confused and impulsive, hard to re-direct. Called family members, but didn't reach any. Holding all anti-anxiety medications due to respiratory status and ABG result per Dr. Leslye Peer. Bed alarm activated. Will continue to monitor patient's neurological status / behavior. [name] [facility]

## 2018-01-02 NOTE — Progress Notes (Signed)
Called nursing [place]. in r/t sitter order. No sitters available at the moment, may receive call back if this changes. Will continue to monitor impulsiveness / confusion. [name] [facility]

## 2018-01-02 NOTE — Progress Notes (Signed)
Patient ID: Lance Nunez, male   DOB: [birth year], 67 y.o.   MRN: [id number]  [employer] PROGRESS NOTE  Lance Nunez LTJ:[id number] DOB: June 09, 1950 DOA: 01/01/2018 PCP: Donnie Coffin, MD  HPI/Subjective: Patient was confused when I saw him but did answer questions.  He stated he wanted to go home and did not want to be here.  The patient had tremors of the upper extremity.  Patient had his oxygen off.  The patient states that he normally has CPAP at night and has trouble sleeping.  As per the daughter his main issue is sleeping at night.  Objective: Vitals:   01/02/18 0619 01/02/18 0823  BP: (!) 182/94 (!) 157/96  Pulse: 72 88  Resp:  18  Temp: (!) 97.5 F (36.4 C)   SpO2: 92% 95%    Filed Weights   01/01/18 2048 01/02/18 0014  Weight: 100.7 kg 102.2 kg    ROS: Review of Systems  Unable to perform ROS: Acuity of condition  Respiratory: Positive for shortness of breath. Negative for cough.   Cardiovascular: Negative for chest pain.  Gastrointestinal: Negative for abdominal pain.   Exam: Physical Exam  HENT:  Nose: No mucosal edema.  Mouth/Throat: No oropharyngeal exudate or posterior oropharyngeal edema.  Eyes: Pupils are equal, round, and reactive to light. Conjunctivae, EOM and lids are normal.  Neck: No JVD present. Carotid bruit is not present. No edema present. No thyroid mass and no thyromegaly present.  Cardiovascular: S1 normal and S2 normal. Exam reveals no gallop.  No murmur heard. Pulses:      Dorsalis pedis pulses are 2+ on the right side, and 2+ on the left side.  Respiratory: No respiratory distress. He has decreased breath sounds in the right middle field, the right lower field, the left middle field and the left lower field. He has wheezes in the right lower field and the left lower field. He has no rhonchi. He has no rales.  GI: Soft. Bowel sounds are normal. There is no tenderness.  Musculoskeletal:       Right ankle: He exhibits  swelling.       Left ankle: He exhibits swelling.  Lymphadenopathy:    He has no cervical adenopathy.  Neurological:  Patient moves all extremities on his own.  Answers some questions.  Confused.  Upper extremities tremors.  Skin: Skin is warm. No rash noted. Nails show no clubbing.  Psychiatric:  Patient able to move all extremities and answers some questions but confused.  Received IV Ativan overnight.      Data Reviewed: Basic Metabolic Panel: Recent Labs  Lab 01/01/18 2141 01/02/18 0612 01/02/18 1457  NA 138 136 137  K 4.6 6.1* 5.1  CL 101 100 100  CO2 29 28 27   GLUCOSE 78 259* 214*  BUN 26* 28* 30*  CREATININE 1.87* 1.74* 1.71*  CALCIUM 8.4* 8.4* 8.5*   Liver Function Tests: Recent Labs  Lab 01/01/18 2141  AST 33  ALT 27  ALKPHOS 63  BILITOT 0.5  PROT 6.5  ALBUMIN 3.4*   CBC: Recent Labs  Lab 01/01/18 2052 01/02/18 0612  WBC 8.0 6.3  HGB 11.8* 11.5*  HCT 38.9* 37.7*  MCV 83.5 84.0  PLT 219 202   Cardiac Enzymes: Recent Labs  Lab 01/01/18 2141 01/02/18 0028 01/02/18 0612  TROPONINI <0.03 <0.03 <0.03   BNP (last 3 results) Recent Labs    12/13/17 1459  BNP 46.0     CBG: Recent Labs  Lab 01/02/18  0019 01/02/18 0818 01/02/18 1202  GLUCAP 80 270* 252*     Studies: Dg Chest 2 View  Result Date: 01/01/2018 CLINICAL DATA:  Dyspnea EXAM: CHEST - 2 VIEW COMPARISON:  11/01/2011 FINDINGS: Stable mild cardiomegaly. Minimal aortic atherosclerosis. Atelectasis or scarring at the right lung base. No overt pulmonary edema. No effusion or pneumothorax. No pulmonary consolidations. ICD device projects over the left hemithorax with leads in the right atrium and right ventricle. Osteoarthritis of the included glenohumeral joints. IMPRESSION: No active pulmonary disease. Mild cardiomegaly with aortic atherosclerosis. Electronically Signed   By: Ashley Royalty M.D.   On: 01/01/2018 21:41    Scheduled Meds: . amiodarone  200 mg Oral Daily  . aspirin EC   81 mg Oral Daily  . atorvastatin  40 mg Oral q1800  . azithromycin  500 mg Oral Daily  . carvedilol  6.25 mg Oral BID WC  . enoxaparin (LOVENOX) injection  40 mg Subcutaneous Q24H  . famotidine  20 mg Oral BID  . insulin aspart  0-5 Units Subcutaneous QHS  . insulin aspart  0-9 Units Subcutaneous TID WC  . methylPREDNISolone (SOLU-MEDROL) injection  40 mg Intravenous Q12H  . mometasone-formoterol  2 puff Inhalation BID  . QUEtiapine  25 mg Oral QHS  . traZODone  100 mg Oral QHS  . verapamil  180 mg Oral Daily    Assessment/Plan:  1. Acute hypoxic hypercarbic respiratory failure.  Start BiPAP at night.  Care manager consultation for potential noninvasive ventilation at home. 2. COPD exacerbation decrease dose of steroids to 40 mg IV every 12 hours.  Zithromax prescribed 3. Acute encephalopathy.  Unclear if he has underlying dementia or not.  Check a B12 TSH and RPR.  PRN Haldol.  Discontinue IV Ativan.  BiPAP at night to blow off CO2. 4. Hyperkalemia.  Dose of Kayexalate given with good response. 5. Type 2 diabetes mellitus on insulin with sliding scale.  Sugars will be high on IV steroids 6. Essential hypertension restart verapamil 7. Chronic kidney disease stage III.  Holding lisinopril 8. Chronic systolic congestive heart failure.  No signs of heart failure currently 9. Hyperlipidemia unspecified on atorvastatin  Code Status:     Code Status Orders  (From admission, onward)         Start     Ordered   01/02/18 0014  Full code  Continuous     01/02/18 0013        Code Status History    Date Active Date Inactive Code Status Order ID Comments User Context   08/17/2017 2010 08/19/2017 1718 Full Code [id number]  Saundra Shelling, MD Inpatient   01/15/2016 0351 01/15/2016 1629 Full Code [id number]  Saundra Shelling, MD Inpatient   09/10/2015 2048 09/13/2015 2016 Full Code [id number]  Idelle Crouch, MD Inpatient   09/06/2015 1506 09/06/2015 1856 Full Code [id number]  Isaias Cowman, MD Inpatient     Family Communication: Family at the bedside  disposition Plan: To be determined  Antibiotics:  Zithromax  Time spent: 28 minutes.  Patient changed to inpatient status  Keigen Caddell [employer]

## 2018-01-02 NOTE — Progress Notes (Addendum)
20/5 range auto bipap settings, currently on 9/7 via auto mode

## 2018-01-02 NOTE — Progress Notes (Signed)
Notified MD of pt requesting something to help rest. Orders placed. Will continue to monitor and assess.

## 2018-01-02 NOTE — Progress Notes (Signed)
Called patient's spouse and daughter r/t patient being impulsive, non-redirectable, anxious and confused. Didn't reach either person. Will continue to monitor patient as closely as possible throughout remainder of dayshift. [name] [facility]

## 2018-01-02 NOTE — Progress Notes (Signed)
Notified MD of pt becoming restless and agitated. Orders placed. Will continue to monitor and assess.

## 2018-01-03 LAB — BASIC METABOLIC PANEL
ANION GAP: 6 (ref 5–15)
BUN: 35 mg/dL — ABNORMAL HIGH (ref 8–23)
CO2: 31 mmol/L (ref 22–32)
Calcium: 8.4 mg/dL — ABNORMAL LOW (ref 8.9–10.3)
Chloride: 101 mmol/L (ref 98–111)
Creatinine, Ser: 1.78 mg/dL — ABNORMAL HIGH (ref 0.61–1.24)
GFR, EST AFRICAN AMERICAN: 44 mL/min — AB (ref >60)
GFR, EST NON AFRICAN AMERICAN: 38 mL/min — AB (ref >60)
GLUCOSE: 207 mg/dL — AB (ref 70–99)
POTASSIUM: 4.9 mmol/L (ref 3.5–5.1)
Sodium: 138 mmol/L (ref 135–145)

## 2018-01-03 LAB — GLUCOSE, CAPILLARY
GLUCOSE-CAPILLARY: 278 mg/dL — AB (ref 70–99)
GLUCOSE-CAPILLARY: 176 mg/dL — AB (ref 70–99)
Glucose-Capillary: 188 mg/dL — ABNORMAL HIGH (ref 70–99)
Glucose-Capillary: 169 mg/dL — ABNORMAL HIGH (ref 70–99)

## 2018-01-03 LAB — TSH: TSH: 1.134 u[IU]/mL (ref 0.350–4.500)

## 2018-01-03 LAB — VITAMIN B12: VITAMIN B 12: 223 pg/mL (ref 180–914)

## 2018-01-03 NOTE — Progress Notes (Signed)
Advanced care plan.  Purpose of the Encounter: CODE STATUS  Parties in Attendance: Patient  Patient's Decision Capacity: Good  Subjective/Patient's story: Presented to emergency room for shortness of breath   Objective/Medical story Patient has COPD exacerbation Needs IV Solu-Medrol, nebulization treatments   Goals of care determination:  Advance care directives and goals of care discussed Patient wants everything done which includes CPR, intubation ventilator if the need arises   CODE STATUS: Full code   Time spent discussing advanced care planning: 16 minutes

## 2018-01-03 NOTE — Progress Notes (Signed)
[place] [facility] [place] NAME: Saatvik Thielman    MR#:  [id number]  DATE OF BIRTH:  06-May-1950  SUBJECTIVE:  CHIEF COMPLAINT:   Chief Complaint  Patient presents with  . Shortness of Breath  . Chest Pain  Patient seen and evaluated today On oxygen via nasal cannula at 4 L No chest pain Has shortness of breath  REVIEW OF SYSTEMS:    ROS  CONSTITUTIONAL: No documented fever. Has fatigue, weakness. No weight gain, no weight loss.  EYES: No blurry or double vision.  ENT: No tinnitus. No postnasal drip. No redness of the oropharynx.  RESPIRATORY: No cough, no wheeze, no hemoptysis. Has dyspnea.  CARDIOVASCULAR: No chest pain. No orthopnea. No palpitations. No syncope.  GASTROINTESTINAL: No nausea, no vomiting or diarrhea. No abdominal pain. No melena or hematochezia.  GENITOURINARY: No dysuria or hematuria.  ENDOCRINE: No polyuria or nocturia. No heat or cold intolerance.  HEMATOLOGY: No anemia. No bruising. No bleeding.  INTEGUMENTARY: No rashes. No lesions.  MUSCULOSKELETAL: No arthritis. No swelling. No gout.  NEUROLOGIC: No numbness, tingling, or ataxia. No seizure-type activity.  PSYCHIATRIC: No anxiety. No insomnia. No ADD.   DRUG ALLERGIES:   Allergies  Allergen Reactions  . Prednisone Other (See Comments)    Pt states, "I just cant take it, I dont know"  . Axid [Nizatidine]   . Codeine Itching    VITALS:  Blood pressure (!) 109/53, pulse 70, temperature 98 F (36.7 C), temperature source Oral, resp. rate 18, height 5\' 4"  (1.626 m), weight 100.4 kg, SpO2 92 %.  PHYSICAL EXAMINATION:   Physical Exam  GENERAL:  67 y.o.-year-old patient lying in the bed with no acute distress.  EYES: Pupils equal, round, reactive to light and accommodation. No scleral icterus. Extraocular muscles intact.  HEENT: Head atraumatic, normocephalic. Oropharynx and nasopharynx clear.  NECK:  Supple, no jugular venous distention. No thyroid  enlargement, no tenderness.  LUNGS: Decreased breath sounds bilaterally, bilateral wheezing. No use of accessory muscles of respiration.  CARDIOVASCULAR: S1, S2 normal. No murmurs, rubs, or gallops.  ABDOMEN: Soft, nontender, nondistended. Bowel sounds present. No organomegaly or mass.  EXTREMITIES: No cyanosis, clubbing or edema b/l.    NEUROLOGIC: Cranial nerves II through XII are intact. No focal Motor or sensory deficits b/l.   PSYCHIATRIC: The patient is alert and oriented x 3.  SKIN: No obvious rash, lesion, or ulcer.   LABORATORY PANEL:   CBC Recent Labs  Lab 01/02/18 0612  WBC 6.3  HGB 11.5*  HCT 37.7*  PLT 202   ------------------------------------------------------------------------------------------------------------------ Chemistries  Recent Labs  Lab 01/01/18 2141  01/03/18 0518  NA 138   < > 138  K 4.6   < > 4.9  CL 101   < > 101  CO2 29   < > 31  GLUCOSE 78   < > 207*  BUN 26*   < > 35*  CREATININE 1.87*   < > 1.78*  CALCIUM 8.4*   < > 8.4*  AST 33  --   --   ALT 27  --   --   ALKPHOS 63  --   --   BILITOT 0.5  --   --    < > = values in this interval not displayed.   ------------------------------------------------------------------------------------------------------------------  Cardiac Enzymes Recent Labs  Lab 01/02/18 0612  TROPONINI <0.03   ------------------------------------------------------------------------------------------------------------------  RADIOLOGY:  Dg Chest 2 View  Result Date: 01/01/2018 CLINICAL DATA:  Dyspnea EXAM:  CHEST - 2 VIEW COMPARISON:  11/01/2011 FINDINGS: Stable mild cardiomegaly. Minimal aortic atherosclerosis. Atelectasis or scarring at the right lung base. No overt pulmonary edema. No effusion or pneumothorax. No pulmonary consolidations. ICD device projects over the left hemithorax with leads in the right atrium and right ventricle. Osteoarthritis of the included glenohumeral joints. IMPRESSION: No active  pulmonary disease. Mild cardiomegaly with aortic atherosclerosis. Electronically Signed   By: Ashley Royalty M.D.   On: 01/01/2018 21:41     ASSESSMENT AND PLAN:   67 year old male patient with history of COPD, type 2 diabetes mellitus, hypertension, chronic kidney disease stage III, chronic systolic heart failure currently under hospitalist service for respiratory distress  -Acute on chronic hypercapnic respiratory failure BiPAP at bedtime Continue nebulization treatments  -Acute COPD exacerbation IV Solu-Medrol 40 MDQ 12 hourly Aggressive and ablation treatments and continue oral Zithromax antibiotic  -Encephalopathy probably secondary to hypercapnia Improved  -Type 2 diabetes mellitus Diabetic diet with sliding scale coverage with insulin  -CKD stage III Monitor renal function  -Chronic systolic heart failure stable No signs of heart failure noted  -DVT prophylaxis subcu Lovenox daily  All the records are reviewed and case discussed with Care Management/Social Worker. Management plans discussed with the patient, family and they are in agreement.  CODE STATUS: Full code  DVT Prophylaxis: SCDs  TOTAL TIME TAKING CARE OF THIS PATIENT: 35 minutes.   POSSIBLE D/C IN 2 to 3 DAYS, DEPENDING ON CLINICAL CONDITION.  Saundra Shelling M.D on 01/03/2018 at 12:57 PM  Between 7am to 6pm - Pager - [phone]  After 6pm go to [email or web address] - password [email or web address] [place] [facility]  Office  [phone]  CC: Primary care physician; Donnie Coffin, MD  Note: This dictation was prepared with Dragon dictation along with smaller phrase technology. Any transcriptional errors that result from this process are unintentional.

## 2018-01-04 LAB — GLUCOSE, CAPILLARY
Glucose-Capillary: 178 mg/dL — ABNORMAL HIGH (ref 70–99)
Glucose-Capillary: 239 mg/dL — ABNORMAL HIGH (ref 70–99)
Glucose-Capillary: 179 mg/dL — ABNORMAL HIGH (ref 70–99)
Glucose-Capillary: 199 mg/dL — ABNORMAL HIGH (ref 70–99)

## 2018-01-04 NOTE — Progress Notes (Signed)
[facility] [place] NAME: Lance Nunez    MR#:  [id number]  DATE OF BIRTH:  1951/01/03  SUBJECTIVE:  CHIEF COMPLAINT:   Chief Complaint  Patient presents with  . Shortness of Breath  . Chest Pain  Patient seen and evaluated today On oxygen via nasal cannula at 3 L No chest pain Has shortness of breath on ambulation  REVIEW OF SYSTEMS:    ROS  CONSTITUTIONAL: No documented fever. Has fatigue, weakness. No weight gain, no weight loss.  EYES: No blurry or double vision.  ENT: No tinnitus. No postnasal drip. No redness of the oropharynx.  RESPIRATORY: No cough, no wheeze, no hemoptysis. Has dyspnea.  CARDIOVASCULAR: No chest pain. No orthopnea. No palpitations. No syncope.  GASTROINTESTINAL: No nausea, no vomiting or diarrhea. No abdominal pain. No melena or hematochezia.  GENITOURINARY: No dysuria or hematuria.  ENDOCRINE: No polyuria or nocturia. No heat or cold intolerance.  HEMATOLOGY: No anemia. No bruising. No bleeding.  INTEGUMENTARY: No rashes. No lesions.  MUSCULOSKELETAL: No arthritis. No swelling. No gout.  NEUROLOGIC: No numbness, tingling, or ataxia. No seizure-type activity.  PSYCHIATRIC: No anxiety. No insomnia. No ADD.   DRUG ALLERGIES:   Allergies  Allergen Reactions  . Prednisone Other (See Comments)    Pt states, "I just cant take it, I dont know"  . Axid [Nizatidine]   . Codeine Itching    VITALS:  Blood pressure (!) 147/72, pulse 68, temperature (!) 97.3 F (36.3 C), temperature source Oral, resp. rate (!) 21, height 5\' 4"  (1.626 m), weight 100.3 kg, SpO2 92 %.  PHYSICAL EXAMINATION:   Physical Exam  GENERAL:  67 y.o.-year-old patient lying in the bed with no acute distress.  EYES: Pupils equal, round, reactive to light and accommodation. No scleral icterus. Extraocular muscles intact.  HEENT: Head atraumatic, normocephalic. Oropharynx and nasopharynx clear.  NECK:  Supple, no jugular venous  distention. No thyroid enlargement, no tenderness.  LUNGS: Decreased breath sounds bilaterally, bilateral wheezing. No use of accessory muscles of respiration.  CARDIOVASCULAR: S1, S2 normal. No murmurs, rubs, or gallops.  ABDOMEN: Soft, nontender, nondistended. Bowel sounds present. No organomegaly or mass.  EXTREMITIES: No cyanosis, clubbing or edema b/l.    NEUROLOGIC: Cranial nerves II through XII are intact. No focal Motor or sensory deficits b/l.   PSYCHIATRIC: The patient is alert and oriented x 3.  SKIN: No obvious rash, lesion, or ulcer.   LABORATORY PANEL:   CBC Recent Labs  Lab 01/02/18 0612  WBC 6.3  HGB 11.5*  HCT 37.7*  PLT 202   ------------------------------------------------------------------------------------------------------------------ Chemistries  Recent Labs  Lab 01/01/18 2141  01/03/18 0518  NA 138   < > 138  K 4.6   < > 4.9  CL 101   < > 101  CO2 29   < > 31  GLUCOSE 78   < > 207*  BUN 26*   < > 35*  CREATININE 1.87*   < > 1.78*  CALCIUM 8.4*   < > 8.4*  AST 33  --   --   ALT 27  --   --   ALKPHOS 63  --   --   BILITOT 0.5  --   --    < > = values in this interval not displayed.   ------------------------------------------------------------------------------------------------------------------  Cardiac Enzymes Recent Labs  Lab 01/02/18 0612  TROPONINI <0.03   ------------------------------------------------------------------------------------------------------------------  RADIOLOGY:  No results found.   ASSESSMENT AND PLAN:  67 year old male patient with history of COPD, type 2 diabetes mellitus, hypertension, chronic kidney disease stage III, chronic systolic heart failure currently under hospitalist service for respiratory distress  -Acute on chronic hypercapnic and hypoxic respiratory failure BiPAP at bedtime Continue nebulization treatments Oxygen via nasal cannula to continue  -Acute COPD exacerbation improving slowly IV  Solu-Medrol 40 MDQ 12 hourly Aggressive nebulization treatments and continue oral Zithromax antibiotic Incentive spirometry  -Encephalopathy probably secondary to hypercapnia Improved  -Type 2 diabetes mellitus Diabetic diet with sliding scale coverage with insulin  -CKD stage III Monitor renal function  -Chronic systolic heart failure stable No signs of heart failure noted  -DVT prophylaxis subcu Lovenox daily  -Ambulatory dysfunction Physical therapy evaluation  All the records are reviewed and case discussed with Care Management/Social Worker. Management plans discussed with the patient, family and they are in agreement.  CODE STATUS: Full code  DVT Prophylaxis: SCDs  TOTAL TIME TAKING CARE OF THIS PATIENT: 33 minutes.   POSSIBLE D/C IN 2 to 3 DAYS, DEPENDING ON CLINICAL CONDITION.  Saundra Shelling M.D on 01/04/2018 at 1:40 PM  Between 7am to 6pm - Pager - [phone]  After 6pm go to [email or web address] - password [email or web address] [facility]  Office  [phone]  CC: Primary care physician; Donnie Coffin, MD  Note: This dictation was prepared with Dragon dictation along with smaller phrase technology. Any transcriptional errors that result from this process are unintentional.

## 2018-01-04 NOTE — Progress Notes (Signed)
SATURATION QUALIFICATIONS: (This note is used to comply with regulatory documentation for home oxygen)  Patient Saturations on Room Air at Rest =87%  Patient Saturations on Room Air while Ambulating =na%  Patient Saturations on 3 Liters of oxygen while at rest = 92%  Please briefly explain why patient needs home oxygen:

## 2018-01-05 LAB — GLUCOSE, CAPILLARY
GLUCOSE-CAPILLARY: 176 mg/dL — AB (ref 70–99)
Glucose-Capillary: 169 mg/dL — ABNORMAL HIGH (ref 70–99)
Glucose-Capillary: 319 mg/dL — ABNORMAL HIGH (ref 70–99)
Glucose-Capillary: 143 mg/dL — ABNORMAL HIGH (ref 70–99)

## 2018-01-05 MED ORDER — CARVEDILOL 12.5 MG PO TABS
12.5000 mg | ORAL_TABLET | Freq: Two times a day (BID) | ORAL | Status: DC
  Administered 2018-01-06: 12.5 mg via ORAL
  Filled 2018-01-05: qty 1

## 2018-01-05 MED ORDER — DEXAMETHASONE 4 MG PO TABS
6.0000 mg | ORAL_TABLET | Freq: Once | ORAL | Status: AC
  Administered 2018-01-06: 6 mg via ORAL
  Filled 2018-01-05: qty 1.5

## 2018-01-05 NOTE — Clinical Social Work Placement (Signed)
   CLINICAL SOCIAL WORK PLACEMENT  NOTE  Date:  01/05/2018  Patient Details  Name: Lance Nunez MRN: [id number] Date of Birth: 18-Dec-1950  Clinical Social Work is seeking post-discharge placement for this patient at the [place] level of care (*CSW will initial, date and re-position this form in  chart as items are completed):  Yes   Patient/family provided with [facility] Work Department's list of facilities offering this level of care within the geographic area requested by the patient (or if unable, by the patient's family).  Yes   Patient/family informed of their freedom to choose among providers that offer the needed level of care, that participate in Medicare, Medicaid or managed care program needed by the patient, have an available bed and are willing to accept the patient.  Yes   Patient/family informed of [facility]'s ownership interest in [facility] and [facility], as well as of the fact that they are under no obligation to receive care at these facilities.  PASRR submitted to EDS on 01/05/18     PASRR number received on 01/05/18     Existing PASRR number confirmed on       FL2 transmitted to all facilities in geographic area requested by pt/family on 01/05/18     FL2 transmitted to all facilities within larger geographic area on       Patient informed that his/her managed care company has contracts with or will negotiate with certain facilities, including the following:        Yes   Patient/family informed of bed offers received.  Patient chooses bed at [facility] )     Physician recommends and patient chooses bed at      Patient to be transferred to   on  .  Patient to be transferred to facility by       Patient family notified on   of transfer.  Name of family member notified:        PHYSICIAN       Additional Comment:    _______________________________________________ Harjot Zavadil, [name],  LCSW 01/05/2018, 2:36 PM

## 2018-01-05 NOTE — Progress Notes (Signed)
[facility] [place] NAME: Cully Luckow    MR#:  [id number]  DATE OF BIRTH:  [birth year]  SUBJECTIVE:  CHIEF COMPLAINT:   Chief Complaint  Patient presents with  . Shortness of Breath  . Chest Pain  Patient seen and evaluated today On oxygen via nasal cannula at 3 L Has periods of confusion No chest pain Has shortness of breath on ambulation Unsteady on feet when evaluated by physical therapy  REVIEW OF SYSTEMS:    ROS  CONSTITUTIONAL: No documented fever. Has fatigue, weakness. No weight gain, no weight loss.  EYES: No blurry or double vision.  ENT: No tinnitus. No postnasal drip. No redness of the oropharynx.  RESPIRATORY: No cough, no wheeze, no hemoptysis. Has dyspnea.  CARDIOVASCULAR: No chest pain. No orthopnea. No palpitations. No syncope.  GASTROINTESTINAL: No nausea, no vomiting or diarrhea. No abdominal pain. No melena or hematochezia.  GENITOURINARY: No dysuria or hematuria.  ENDOCRINE: No polyuria or nocturia. No heat or cold intolerance.  HEMATOLOGY: No anemia. No bruising. No bleeding.  INTEGUMENTARY: No rashes. No lesions.  MUSCULOSKELETAL: No arthritis. No swelling. No gout.  NEUROLOGIC: No numbness, tingling, or ataxia. No seizure-type activity.  PSYCHIATRIC: No anxiety. No insomnia. No ADD.   DRUG ALLERGIES:   Allergies  Allergen Reactions  . Prednisone Other (See Comments)    Pt states, "I just cant take it, I dont know"  . Axid [Nizatidine]   . Codeine Itching    VITALS:  Blood pressure (!) 151/68, pulse 63, temperature 98 F (36.7 C), resp. rate 18, height 5\' 4"  (1.626 m), weight 100 kg, SpO2 94 %.  PHYSICAL EXAMINATION:   Physical Exam  GENERAL:  67 y.o.-year-old patient lying in the bed with no acute distress.  EYES: Pupils equal, round, reactive to light and accommodation. No scleral icterus. Extraocular muscles intact.  HEENT: Head atraumatic, normocephalic. Oropharynx and nasopharynx clear.   NECK:  Supple, no jugular venous distention. No thyroid enlargement, no tenderness.  LUNGS: Decreased breath sounds bilaterally, bilateral wheezing. No use of accessory muscles of respiration.  CARDIOVASCULAR: S1, S2 normal. No murmurs, rubs, or gallops.  ABDOMEN: Soft, nontender, nondistended. Bowel sounds present. No organomegaly or mass.  EXTREMITIES: No cyanosis, clubbing or edema b/l.    NEUROLOGIC: Cranial nerves II through XII are intact. No focal Motor or sensory deficits b/l.   PSYCHIATRIC: The patient is alert and oriented x 3.  SKIN: No obvious rash, lesion, or ulcer.   LABORATORY PANEL:   CBC Recent Labs  Lab 01/02/18 0612  WBC 6.3  HGB 11.5*  HCT 37.7*  PLT 202   ------------------------------------------------------------------------------------------------------------------ Chemistries  Recent Labs  Lab 01/01/18 2141  01/03/18 0518  NA 138   < > 138  K 4.6   < > 4.9  CL 101   < > 101  CO2 29   < > 31  GLUCOSE 78   < > 207*  BUN 26*   < > 35*  CREATININE 1.87*   < > 1.78*  CALCIUM 8.4*   < > 8.4*  AST 33  --   --   ALT 27  --   --   ALKPHOS 63  --   --   BILITOT 0.5  --   --    < > = values in this interval not displayed.   ------------------------------------------------------------------------------------------------------------------  Cardiac Enzymes Recent Labs  Lab 01/02/18 0612  TROPONINI <0.03   ------------------------------------------------------------------------------------------------------------------  RADIOLOGY:  No results  found.   ASSESSMENT AND PLAN:   67 year old male patient with history of COPD, type 2 diabetes mellitus, hypertension, chronic kidney disease stage III, chronic systolic heart failure currently under hospitalist service for respiratory distress  -Acute on chronic hypercapnic and hypoxic respiratory failure BiPAP at bedtime Continue nebulization treatments Oxygen via nasal cannula to continue  -Acute COPD  exacerbation improving  Discontinue IV steroids Start oral steroids Aggressive nebulization treatments and continue oral Zithromax antibiotic Incentive spirometry  -Gait instability and dizziness Physical therapy for balance and gait training SNF placement Social worker consult Patient unable to take care of himself at home Aggressive nebulization treatments and continue oral Zithromax antibiotic Incentive spirometry  -Encephalopathy probably secondary to hypercapnia Improved  -Type 2 diabetes mellitus Diabetic diet with sliding scale coverage with insulin  -CKD stage III Monitor renal function  -Chronic systolic heart failure stable No signs of heart failure noted  -DVT prophylaxis subcu Lovenox daily  -Ambulatory dysfunction Physical therapy evaluation appreciated  All the records are reviewed and case discussed with Care Management/Social Worker. Management plans discussed with the patient, family and they are in agreement.  CODE STATUS: Full code  DVT Prophylaxis: SCDs  TOTAL TIME TAKING CARE OF THIS PATIENT: 34 minutes.   POSSIBLE D/C IN 2 to 3 DAYS, DEPENDING ON CLINICAL CONDITION.  Saundra Shelling M.D on 01/05/2018 at 12:54 PM  Between 7am to 6pm - Pager - [phone]  After 6pm go to [email or web address] - password [email or web address] [facility]  Office  [phone]  CC: Primary care physician; Donnie Coffin, MD  Note: This dictation was prepared with Dragon dictation along with smaller phrase technology. Any transcriptional errors that result from this process are unintentional.

## 2018-01-05 NOTE — Progress Notes (Signed)
Patient working with PT at this time.  Patient states dizziness with ambulation.  Patient O2 90% on 3L.  Dr, Pyreddy aware.  Patient with O2 2L at home.  BP 155/81.

## 2018-01-05 NOTE — Care Management Important Message (Signed)
Copy of signed IM left with patient in room.  

## 2018-01-05 NOTE — Evaluation (Signed)
Physical Therapy Evaluation Patient Details Name: Lance Nunez MRN: [id number] DOB: 09-22-1950 Today's Date: 01/05/2018   History of Present Illness  Pt is a 67 y.o. male presenting to hospital 01/01/18 with SOB x several days and chest pain.  Pt admitted with COPD with acute exacerbation, acute hypoxic hypercarbic respiratory failure, and hyperkalemia.  PMH includes COPD, DM, htn, MI, CAD, ICD.  Clinical Impression  Prior to hospital admission, pt reports being independent with functional mobility, ADL's, and IADL's.  Pt inconsistent with information during session (initially pt reporting he did not have supplemental O2 at home and then later in session pt reporting having O2 at home).  Pt reports living alone in 1 level home with 3 steps to enter with R railing; pt also reports driving.  Currently pt is SBA semi-supine to sit; CGA with transfers; and CGA ambulating 10 feet x2 with RW (pt unsteady attempting to walk without UE support so RW trialed).  Distance ambulating limited d/t pt's c/o dizziness (BP 155/81; O2 sats 89-90% on 3 L O2 via nasal cannula during session's activities--nurse notified) and pt also noted with generalized weakness and appearing shaky when attempting to ambulate. Pt oriented to person and place only; appearing confused during session and inconsistent with multi-step and single step commands requiring additional cueing for safety.  Pt would benefit from skilled PT to address noted impairments and functional limitations (see below for any additional details).  Upon hospital discharge, recommend pt discharge to [place].    Follow Up Recommendations SNF    Equipment Recommendations  Rolling walker with 5" wheels    Recommendations for Other Services OT consult     Precautions / Restrictions Precautions Precautions: Fall Restrictions Weight Bearing Restrictions: No      Mobility  Bed Mobility Overal bed mobility: Needs Assistance Bed Mobility: Supine to Sit      Supine to sit: Supervision;HOB elevated     General bed mobility comments: increased effort/time for pt to perform on own with heavy use of bedrail  Transfers Overall transfer level: Needs assistance Equipment used: Rolling walker (2 wheeled);None Transfers: Sit to/from Stand Sit to Stand: Min guard         General transfer comment: increased effort to stand (vc's required for safe technique); x2 trials from bed; x1 trial from chair  Ambulation/Gait Ambulation/Gait assistance: Min guard Gait Distance (Feet): (10 feet x2) Assistive device: Rolling walker (2 wheeled)   Gait velocity: decreased   General Gait Details: forward flexed posture; decreased B step length/foot clearance/heelstrike; limited distance d/t c/o dizziness; pt also appearing shaky and weak  Stairs            Wheelchair Mobility    Modified Rankin (Stroke Patients Only)       Balance Overall balance assessment: Needs assistance Sitting-balance support: No upper extremity supported;Feet supported Sitting balance-Leahy Scale: Good Sitting balance - Comments: steady sitting reaching within BOS   Standing balance support: Single extremity supported Standing balance-Leahy Scale: Poor Standing balance comment: pt requires at least single UE support for static standing balance                             Pertinent Vitals/Pain Pain Assessment: No/denies pain  HR WFL during session.    Home Living Family/patient expects to be discharged to:: Private residence Living Arrangements: Alone   Type of Home: House Home Access: Stairs to enter Entrance Stairs-Rails: Right Entrance Stairs-Number of Steps: 3 Home Layout:  One level Home Equipment: Bedside commode      Prior Function Level of Independence: Independent         Comments: Pt reports ambulating household distances d/t R leg pain (h/o fall out of tree years ago and surgically addressed).  (+) driving.  Pt denies any  recent falls.     Hand Dominance        Extremity/Trunk Assessment   Upper Extremity Assessment Upper Extremity Assessment: Generalized weakness    Lower Extremity Assessment Lower Extremity Assessment: Generalized weakness    Cervical / Trunk Assessment Cervical / Trunk Assessment: Kyphotic  Communication   Communication: No difficulties  Cognition Arousal/Alertness: Awake/alert Behavior During Therapy: Impulsive Overall Cognitive Status: No family/caregiver present to determine baseline cognitive functioning                                 General Comments: Oriented to person and place but not situation or date.  Inconsistent with multi and single step commands requiring visual/demo and tactile cueing.      General Comments   Nursing cleared pt for participation in physical therapy.  Pt agreeable to PT session.    Exercises     Assessment/Plan    PT Assessment Patient needs continued PT services  PT Problem List Decreased strength;Decreased activity tolerance;Decreased balance;Decreased mobility;Decreased cognition;Decreased knowledge of use of DME;Decreased safety awareness;Decreased knowledge of precautions       PT Treatment Interventions DME instruction;Gait training;Stair training;Functional mobility training;Therapeutic activities;Therapeutic exercise;Balance training;Patient/family education    PT Goals (Current goals can be found in the Care Plan section)  Acute Rehab PT Goals Patient Stated Goal: to go home PT Goal Formulation: With patient Time For Goal Achievement: 01/19/18 Potential to Achieve Goals: Fair    Frequency Min 2X/week   Barriers to discharge Decreased caregiver support      Co-evaluation               AM-PAC PT "6 Clicks" Daily Activity  Outcome Measure Difficulty turning over in bed (including adjusting bedclothes, sheets and blankets)?: A Little Difficulty moving from lying on back to sitting on the side of  the bed? : A Lot Difficulty sitting down on and standing up from a chair with arms (e.g., wheelchair, bedside commode, etc,.)?: Unable Help needed moving to and from a bed to chair (including a wheelchair)?: A Little Help needed walking in hospital room?: A Little Help needed climbing 3-5 steps with a railing? : A Lot 6 Click Score: 14    End of Session Equipment Utilized During Treatment: Gait belt;Oxygen(3 L O2 via nasal cannula) Activity Tolerance: Patient limited by fatigue;Other (comment)(Limited d/t dizziness with ambulation) Patient left: in chair;with call bell/phone within reach;with chair alarm set Nurse Communication: Mobility status;Precautions;Other (comment)(pt's c/o dizziness and vitals) PT Visit Diagnosis: Other abnormalities of gait and mobility (R26.89);Muscle weakness (generalized) (M62.81);Difficulty in walking, not elsewhere classified (R26.2)    Time: 1505-6979 PT Time Calculation (min) (ACUTE ONLY): 34 min   Charges:   PT Evaluation $PT Eval Low Complexity: 1 Low PT Treatments $Therapeutic Activity: 8-22 mins       Leitha Bleak, PT 01/05/18, 12:47 PM [phone]

## 2018-01-05 NOTE — Progress Notes (Signed)
Patient on bipap for 10 minutes then up out of the bed.  Patient sitting in hall on o2 tank.

## 2018-01-05 NOTE — [place] FL2 (Signed)
[place] LEVEL OF CARE SCREENING TOOL     IDENTIFICATION  Patient Name: Lance Nunez Birthdate: [birth year] Sex: male Admission Date (Current Location): 01/01/2018  [place] and [place] Number:  Engineering geologist and Address:  [facility], [address], [place], [place] [address]      Provider Number: [id number]  Attending Physician Name and Address:  Saundra Shelling, MD  Relative Name and Phone Number:       Current Level of Care: Hospital Recommended Level of Care: [place] Prior Approval Number:    Date Approved/Denied:   PASRR Number: [id number])  Discharge Plan: SNF    Current Diagnoses: Patient Active Problem List   Diagnosis Date Noted  . COPD exacerbation ([place]) 01/02/2018  . CAD (coronary artery disease) 01/01/2018  . COPD with acute exacerbation ([place]) 01/01/2018  . Chronic systolic CHF (congestive heart failure) ([place]) 01/01/2018  . Acute respiratory failure with hypoxemia ([place]) 08/18/2017  . Chest pain 08/17/2017  . 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 Dilated cardiomyopathy ([place]) 09/10/2015  . Diabetes mellitus without complication ([place]) 62/22/9798  . HTN, goal below 140/80 09/10/2015    Orientation RESPIRATION BLADDER Height & Weight     Self, Place  O2(3 Liters Oxygen. ) Continent Weight: 220 lb 6.4 oz (100 kg) Height:  5\' 4"  (162.6 cm)  BEHAVIORAL SYMPTOMS/MOOD NEUROLOGICAL BOWEL NUTRITION STATUS      Continent Diet(Diet: Heart Healthy/ Carb Modified. )  AMBULATORY STATUS COMMUNICATION OF NEEDS Skin   Extensive Assist Verbally Normal                       Personal Care Assistance Level of Assistance  Bathing, Feeding, Dressing Bathing Assistance: Limited assistance Feeding assistance: Independent Dressing Assistance: Limited assistance     Functional Limitations Info  Sight, Hearing, Speech Sight  Info: Adequate Hearing Info: Adequate Speech Info: Adequate    SPECIAL CARE FACTORS FREQUENCY  PT (By licensed PT), OT (By licensed OT)     PT Frequency: (5) OT Frequency: (5)            Contractures      Additional Factors Info  Code Status, Allergies Code Status Info: (Full Code. ) Allergies Info: (Prednisone, Axid Nizatidine, Codeine)           Current Medications (01/05/2018):  This is the current hospital active medication list Current Facility-Administered Medications  Medication Dose Route Frequency Provider Last Rate Last Dose  . acetaminophen (TYLENOL) tablet 650 mg  650 mg Oral Q6H PRN Lance Coon, MD       Or  . acetaminophen (TYLENOL) suppository 650 mg  650 mg Rectal Q6H PRN Lance Coon, MD      . amiodarone (PACERONE) tablet 200 mg  200 mg Oral Daily Lance Coon, MD   200 mg at 01/05/18 1057  . aspirin EC tablet 81 mg  81 mg Oral Daily Lance Coon, MD   81 mg at 01/05/18 0901  . atorvastatin (LIPITOR) tablet 40 mg  40 mg Oral q1800 Lance Coon, MD   40 mg at 01/04/18 1721  . budesonide (PULMICORT) nebulizer solution 0.5 mg  0.5 mg Nebulization BID Loletha Grayer, MD   0.5 mg at 01/05/18 0758  . [START ON 01/06/2018] carvedilol (COREG) tablet 12.5 mg  12.5 mg Oral BID WC Pyreddy, Pavan, MD      . carvedilol (COREG) tablet 6.25 mg  6.25 mg Oral BID WC  Saundra Shelling, MD   6.25 mg at 01/05/18 0902  . [START ON 01/06/2018] dexamethasone (DECADRON) tablet 6 mg  6 mg Oral Once Pyreddy, Reatha Harps, MD      . enoxaparin (LOVENOX) injection 40 mg  40 mg Subcutaneous Q24H Lance Coon, MD   40 mg at 01/04/18 2223  . famotidine (PEPCID) tablet 20 mg  20 mg Oral BID Lance Coon, MD   20 mg at 01/05/18 0901  . guaiFENesin-dextromethorphan (ROBITUSSIN DM) 100-10 MG/5ML syrup 5 mL  5 mL Oral Q4H PRN Lance Coon, MD      . haloperidol lactate (HALDOL) injection 1 mg  1 mg Intravenous Q6H PRN Loletha Grayer, MD   1 mg at 01/05/18 0210  . insulin aspart (novoLOG)  injection 0-5 Units  0-5 Units Subcutaneous QHS Lance Coon, MD      . insulin aspart (novoLOG) injection 0-9 Units  0-9 Units Subcutaneous TID WC Lance Coon, MD   7 Units at 01/05/18 1149  . ipratropium-albuterol (DUONEB) 0.5-2.5 (3) MG/3ML nebulizer solution 3 mL  3 mL Nebulization Q6H Loletha Grayer, MD   3 mL at 01/05/18 0758  . ondansetron (ZOFRAN) tablet 4 mg  4 mg Oral Q6H PRN Lance Coon, MD       Or  . ondansetron [facility]) injection 4 mg  4 mg Intravenous Q6H PRN Lance Coon, MD      . QUEtiapine (SEROQUEL) tablet 25 mg  25 mg Oral QHS Loletha Grayer, MD   25 mg at 01/04/18 2222  . traZODone (DESYREL) tablet 100 mg  100 mg Oral QHS Loletha Grayer, MD   100 mg at 01/04/18 2222     Discharge Medications: Please see discharge summary for a list of discharge medications.  Relevant Imaging Results:  Relevant Lab Results:   Additional Information (SSN: [id number])  Shellene Sweigert, [name], LCSW

## 2018-01-05 NOTE — Clinical Social Work Note (Signed)
Clinical Social Work Assessment  Patient Details  Name: Lance Nunez MRN: [id number] Date of Birth: 04-Jun-1950  Date of referral:  01/05/18               Reason for consult:  Facility Placement                Permission sought to share information with:  Lance Nunez granted to share information::  Yes, Verbal Permission Granted  Name::      Lance::   Nunez   Relationship::     Contact Information:     Housing/Transportation Living arrangements for the past 2 months:  [place] of Information:  Patient, Adult Children, Friend/Neighbor Patient Interpreter Needed:  None Criminal Activity/Legal Involvement Pertinent to Current Situation/Hospitalization:  No - Comment as needed Significant Relationships:  Friend, Adult Children Lives with:  Roommate Do you feel safe going back to the place where you live?  Yes Need for family participation in patient care:  Yes (Comment)  Care giving concerns:  Patient lives in [place] with his friend Lance Nunez.    Social Worker assessment / plan:  Holiday representative (CSW) received verbal consult from PT that recommendation is SNF. CSW met with patient alone at bedside to discuss D/C plan. Patient was sitting up in the chair at bedside and was oriented to self and place. CSW introduced self and explained role of CSW department. Per patient he lives with his friend Lance Nunez and his daughter Lance Nunez is his primary support. CSW discussed SNF placement and patient asked CSW to call his daughter. CSW contacted patient's friend Lance Nunez [phone] who confirmed that she lives with patient. Per Lance Nunez she talks with patient's daughter daily about his care. CSW contacted patient's daughter Lance Nunez and made her aware of above. Lance Nunez is agreeable to SNF search in [name]. CSW explained that [name] will have to approve SNF. FL2 complete and faxed out.   CSW presented bed offers  to Lance Nunez and she chose [name]. Per Lance Nunez patient's ex-wife is in the memory care unit at [name]. Per Lance Nunez patient has his own cpap machine that she can bring to [name] from home. Per [name] she will start [name] SNF authorization today.   Employment status:  Retired Nurse, adult PT Recommendations:  [name] / Referral to community resources:  [name]  Patient/Family's Response to care:  Patient's daughter chose [name]   Patient/Family's Understanding of and Emotional Response to Diagnosis, Current Treatment, and Prognosis:  Patient and his daughter were very pleasant and thanked CSW for assistance.   Emotional Assessment Appearance:  Appears stated age Attitude/Demeanor/Rapport:    Affect (typically observed):  Pleasant Orientation:  Oriented to Self, Oriented to Place, Fluctuating Orientation (Suspected and/or reported Sundowners) Alcohol / Substance use:  Not Applicable Psych involvement (Current and /or in the community):  No (Comment)  Discharge Needs  Concerns to be addressed:  Discharge Planning Concerns Readmission within the last 30 days:  No Current discharge risk:  Dependent with Mobility Barriers to Discharge:  Continued Medical Work up   Lance Nunez, Lance Beets, LCSW 01/05/2018, 2:37 PM

## 2018-01-06 ENCOUNTER — Encounter
Admission: RE | Admit: 2018-01-06 | Discharge: 2018-01-06 | Disposition: A | Discharge status: Home / Self Care | Payer: Medicare Other | Source: Ambulatory Visit | Attending: Internal Medicine | Admitting: Internal Medicine

## 2018-01-06 DIAGNOSIS — R410 Disorientation, unspecified: Secondary | ICD-10-CM | POA: Insufficient documentation

## 2018-01-06 LAB — GLUCOSE, CAPILLARY
GLUCOSE-CAPILLARY: 114 mg/dL — AB (ref 70–99)
Glucose-Capillary: 244 mg/dL — ABNORMAL HIGH (ref 70–99)

## 2018-01-06 LAB — RPR: RPR: NONREACTIVE

## 2018-01-06 MED ORDER — QUETIAPINE FUMARATE 25 MG PO TABS: 25.0000 mg | ORAL_TABLET | Freq: Every day | ORAL | 0 refills | Status: AC

## 2018-01-06 MED ORDER — TRAZODONE HCL 100 MG PO TABS: 100.0000 mg | ORAL_TABLET | Freq: Every day | ORAL | 0 refills | Status: DC

## 2018-01-06 MED ORDER — FAMOTIDINE 20 MG PO TABS: 20.0000 mg | ORAL_TABLET | Freq: Every day | ORAL | 0 refills | Status: AC

## 2018-01-06 MED ORDER — GLIPIZIDE ER 10 MG PO TB24: 10.0000 mg | ORAL_TABLET | Freq: Every day | ORAL | 0 refills | Status: DC

## 2018-01-06 NOTE — Clinical Social Work Placement (Signed)
   CLINICAL SOCIAL WORK PLACEMENT  NOTE  Date:  01/06/2018  Patient Details  Name: Lance Nunez MRN: [id number] Date of Birth: 08-05-1950  Clinical Social Work is seeking post-discharge placement for this patient at the [place] level of care (*CSW will initial, date and re-position this form in  chart as items are completed):  Yes   Patient/family provided with [place] Work Department's list of facilities offering this level of care within the geographic area requested by the patient (or if unable, by the patient's family).  Yes   Patient/family informed of their freedom to choose among providers that offer the needed level of care, that participate in Medicare, Medicaid or managed care program needed by the patient, have an available bed and are willing to accept the patient.  Yes   Patient/family informed of [place]'s ownership interest in [facility] and [facility], as well as of the fact that they are under no obligation to receive care at these facilities.  PASRR submitted to EDS on 01/05/18     PASRR number received on 01/05/18     Existing PASRR number confirmed on       FL2 transmitted to all facilities in geographic area requested by pt/family on 01/05/18     FL2 transmitted to all facilities within larger geographic area on       Patient informed that his/her managed care company has contracts with or will negotiate with certain facilities, including the following:        Yes   Patient/family informed of bed offers received.  Patient chooses bed at [facility] )     Physician recommends and patient chooses bed at      Patient to be transferred to [facility] on 01/06/18.  Patient to be transferred to facility by [facility]     Patient family notified on 01/06/18 of transfer.  Name of family member notified:  Abigail Butts patient's daughter [phone]     PHYSICIAN Please sign FL2,  Please prepare priority discharge summary, including medications     Additional Comment:    _______________________________________________ Ross Ludwig, LCSWA 01/06/2018, 3:17 PM

## 2018-01-06 NOTE — Progress Notes (Addendum)
Pt has been voiding throughout night, however very small amounts. Pt has been restless and began complaining of abdominal pain. Nurse Tech bladder scanned pt showing >943mL. Prime doc notified. Per orders, will do I&O cath.   I&O cath with 1,193mL return. Pt reports relief. Will continue to monitor.

## 2018-01-06 NOTE — Discharge Summary (Addendum)
[place] [facility] [place] NAME: Lance Nunez    MR#:  [id number]  DATE OF BIRTH:  1950-05-01  DATE OF ADMISSION:  01/01/2018 ADMITTING PHYSICIAN: Lance Coon, MD  DATE OF DISCHARGE: 01/06/2018  PRIMARY CARE PHYSICIAN: Donnie Coffin, MD   ADMISSION DIAGNOSIS:  COPD exacerbation ([place]) [J44.1] Acute on chronic respiratory failure with hypoxia and hypercapnia (HCC) [J96.21, J96.22] Chest pain, unspecified type [R07.9]  DISCHARGE DIAGNOSIS:  Principal Problem:   COPD with acute exacerbation ([place]) Active Problems:   Diabetes mellitus without complication (HCC)   HTN, goal below 140/80   CAD (coronary artery disease)   Chronic systolic CHF (congestive heart failure) (HCC)   COPD exacerbation (HCC) Chest pain atypical  SECONDARY DIAGNOSIS:   Past Medical History:  Diagnosis Date  . Chronic systolic CHF (congestive heart failure) ([place]) 01/01/2018  . COPD (chronic obstructive pulmonary disease) ([place])   . Coronary artery disease   . Diabetes mellitus without complication ([place])   . Hypertension   . Myocardial infarction [facility])    95' 97' 98'     ADMITTING HISTORY Lance Nunez  is a 67 y.o. male who presents with chief complaint as above.  Patient had an episode tonight of acute onset chest tightness with wheezing and respiratory distress.  He came to the ED for evaluation.  Work-up here is consistent largely with COPD exacerbation.  Patient does have significant prior cardiac history with multiple heart attacks in the past.  His initial cardiac work-up in the ED was largely within normal limits.  Hospitalist were called for admission for further treatment and evaluation  HOSPITAL COURSE:  Patient was admitted to medical floor and started on aggressive nebulization treatments along with Zithromax antibiotic.  He received IV Solu-Medrol during the stay in the hospital.  Shortness of breath improved.  Patient with CPAP at bedtime.  Blood  sugars were controlled aggressively in the hospitalization.  Steroids were stopped secondary to confusion.  Patient tolerated oral Zithromax antibiotics well.  Shortness of breath improved.  He was evaluated by physical therapy was still unsteady on feet and lives alone and unable to take care of himself at home.  Social worker consultation was noted for placement.  Patient's verapamil and ACE inhibitor started because of low ejection fraction and elevated potassium. once bed is available patient will be discharged to [facility] facility.  CONSULTS OBTAINED:    DRUG ALLERGIES:   Allergies  Allergen Reactions  . Prednisone Other (See Comments)    Pt states, "I just cant take it, I dont know"  . Axid [Nizatidine]   . Codeine Itching    DISCHARGE MEDICATIONS:   Allergies as of 01/06/2018      Reactions   Prednisone Other (See Comments)   Pt states, "I just cant take it, I dont know"   Axid [nizatidine]    Codeine Itching      Medication List    STOP taking these medications   diazepam 5 MG tablet Commonly known as:  VALIUM   meclizine 25 MG tablet Commonly known as:  ANTIVERT   quinapril 20 MG tablet Commonly known as:  ACCUPRIL   ranitidine 150 MG tablet Commonly known as:  ZANTAC Replaced by:  famotidine 20 MG tablet   verapamil 180 MG CR tablet Commonly known as:  CALAN-SR     TAKE these medications   albuterol 108 (90 Base) MCG/ACT inhaler Commonly known as:  PROVENTIL HFA;VENTOLIN HFA Inhale 1-2 puffs into the lungs every  6 (six) hours as needed for wheezing or shortness of breath.   amiodarone 200 MG tablet Commonly known as:  PACERONE Take 1 tablet (200 mg total) by mouth daily.   aspirin EC 81 MG tablet Take 81 mg by mouth daily.   atorvastatin 40 MG tablet Commonly known as:  LIPITOR Take 1 tablet (40 mg total) by mouth daily at 6 PM.   carvedilol 12.5 MG tablet Commonly known as:  COREG Take 1 tablet (12.5 mg total) by mouth 2 (two) times daily with  a meal. What changed:  how much to take   famotidine 20 MG tablet Commonly known as:  PEPCID Take 1 tablet (20 mg total) by mouth daily. Replaces:  ranitidine 150 MG tablet   glipiZIDE 10 MG 24 hr tablet Commonly known as:  GLUCOTROL XL Take 1 tablet (10 mg total) by mouth daily with breakfast. What changed:  when to take this   metFORMIN 500 MG tablet Commonly known as:  GLUCOPHAGE Take 500 mg by mouth daily.   mometasone-formoterol 200-5 MCG/ACT Aero Commonly known as:  DULERA Inhale 2 puffs into the lungs 2 (two) times daily.   nitroGLYCERIN 0.4 MG SL tablet Commonly known as:  NITROSTAT Place 0.4 mg under the tongue every 5 (five) minutes as needed for chest pain.   QUEtiapine 25 MG tablet Commonly known as:  SEROQUEL Take 1 tablet (25 mg total) by mouth at bedtime.   traZODone 100 MG tablet Commonly known as:  DESYREL Take 1 tablet (100 mg total) by mouth at bedtime.       Today  Patient seen and evaluated today Awake and responds to all verbal commands No confusion Tolerating diet well  VITAL SIGNS:  Blood pressure 108/82, pulse 69, temperature 98.5 F (36.9 C), temperature source Oral, resp. rate 18, height 5\' 4"  (1.626 m), weight 100 kg, SpO2 91 %.  I/O:    Intake/Output Summary (Last 24 hours) at 01/06/2018 1352 Last data filed at 01/06/2018 3825 Gross per 24 hour  Intake -  Output 2380 ml  Net -2380 ml    PHYSICAL EXAMINATION:  Physical Exam  GENERAL:  67 y.o.-year-old patient lying in the bed with no acute distress.  LUNGS: Normal breath sounds bilaterally, no wheezing, rales,rhonchi or crepitation. No use of accessory muscles of respiration.  CARDIOVASCULAR: S1, S2 normal. No murmurs, rubs, or gallops.  ABDOMEN: Soft, non-tender, non-distended. Bowel sounds present. No organomegaly or mass.  NEUROLOGIC: Moves all 4 extremities. PSYCHIATRIC: The patient is alert and oriented x 3.  SKIN: No obvious rash, lesion, or ulcer.   DATA REVIEW:    CBC Recent Labs  Lab 01/02/18 0612  WBC 6.3  HGB 11.5*  HCT 37.7*  PLT 202    Chemistries  Recent Labs  Lab 01/01/18 2141  01/03/18 0518  NA 138   < > 138  K 4.6   < > 4.9  CL 101   < > 101  CO2 29   < > 31  GLUCOSE 78   < > 207*  BUN 26*   < > 35*  CREATININE 1.87*   < > 1.78*  CALCIUM 8.4*   < > 8.4*  AST 33  --   --   ALT 27  --   --   ALKPHOS 63  --   --   BILITOT 0.5  --   --    < > = values in this interval not displayed.    Cardiac Enzymes Recent Labs  Lab 01/02/18 0612  TROPONINI <0.03    Microbiology Results  Results for orders placed or performed during the hospital encounter of 08/29/15  Surgical pcr screen     Status: None   Collection Time: 08/29/15  9:14 AM  Result Value Ref Range Status   MRSA, PCR NEGATIVE NEGATIVE Final   Staphylococcus aureus NEGATIVE NEGATIVE Final    Comment:        The Xpert SA Assay (FDA approved for NASAL specimens in patients over 67 years of age), is one component of a comprehensive surveillance program.  Test performance has been validated by [facility] for patients greater than or equal to 74 year old. It is not intended to diagnose infection nor to guide or monitor treatment.     RADIOLOGY:  No results found.  Follow up with PCP in 1 week.  Management plans discussed with the patient, family and they are in agreement.  CODE STATUS: Full code    Code Status Orders  (From admission, onward)         Start     Ordered   01/02/18 0014  Full code  Continuous     01/02/18 0013        Code Status History    Date Active Date Inactive Code Status Order ID Comments User Context   08/17/2017 2010 08/19/2017 1718 Full Code [id number]  Saundra Shelling, MD Inpatient   01/15/2016 0351 01/15/2016 1629 Full Code [id number]  Saundra Shelling, MD Inpatient   09/10/2015 2048 09/13/2015 2016 Full Code [id number]  Idelle Crouch, MD Inpatient   09/06/2015 1506 09/06/2015 1856 Full Code [id number]  Isaias Cowman, MD Inpatient      TOTAL TIME TAKING CARE OF THIS PATIENT ON DAY OF DISCHARGE: more than 35 minutes.   Saundra Shelling M.D on 01/06/2018 at 1:52 PM  Between 7am to 6pm - Pager - [phone]  After 6pm go to [email or web address] - password [email or web address] [facility]  Office  [phone]  CC: Primary care physician; Donnie Coffin, MD  Note: This dictation was prepared with Dragon dictation along with smaller phrase technology. Any transcriptional errors that result from this process are unintentional.

## 2018-01-06 NOTE — Clinical Social Work Note (Signed)
CSW received phone call that patient has been approved for SNF placement by insurance company.  CSW updated bedside nurse, and patient's daughter.  Patient to be d/c'ed today to [facility] room 214.                       Patient and family agreeable to plans will transport via ems RN to call report [phone].  CSW spoke to patient's daughter Ancil Linsey, [phone] and informed her that patient will be discharging to SNF today.  Evette Cristal, MSW, [place]

## 2018-01-06 NOTE — Plan of Care (Signed)
  Problem: Education: Goal: Knowledge of General Education information will improve Description Including pain rating scale, medication(s)/side effects and non-pharmacologic comfort measures Outcome: Progressing   Problem: Health Behavior/Discharge Planning: Goal: Ability to manage health-related needs will improve Outcome: Progressing   Problem: Clinical Measurements: Goal: Ability to maintain clinical measurements within normal limits will improve Outcome: Progressing Goal: Will remain free from infection Outcome: Progressing Goal: Diagnostic test results will improve Outcome: Progressing Goal: Respiratory complications will improve Outcome: Progressing Goal: Cardiovascular complication will be avoided Outcome: Progressing   Problem: Activity: Goal: Risk for activity intolerance will decrease Outcome: Progressing   Problem: Nutrition: Goal: Adequate nutrition will be maintained Outcome: Progressing   Problem: Coping: Goal: Level of anxiety will decrease Outcome: Progressing   Problem: Elimination: Goal: Will not experience complications related to bowel motility Outcome: Progressing Goal: Will not experience complications related to urinary retention Outcome: Progressing   Problem: Pain Managment: Goal: General experience of comfort will improve Outcome: Progressing   Problem: Safety: Goal: Ability to remain free from injury will improve Outcome: Progressing   Problem: Skin Integrity: Goal: Risk for impaired skin integrity will decrease Outcome: Progressing   Problem: Respiratory: Goal: Ability to maintain a clear airway will improve Outcome: Progressing Goal: Levels of oxygenation will improve Outcome: Progressing Goal: Ability to maintain adequate ventilation will improve Outcome: Progressing   

## 2018-01-06 NOTE — Progress Notes (Signed)
Pt very agitated and impulsive trying to get out of bed without assistance. Prn IV haldol given for agitation. Pt on low bed with alarm on. Will continue to monitor.

## 2018-01-06 NOTE — Progress Notes (Signed)
Pt to be discharged to [facility] place today rm 214. Iv and tele removed. Report called to elizaabeth at the facility. Transport by ems.

## 2018-01-07 ENCOUNTER — Other Ambulatory Visit
Admission: RE | Admit: 2018-01-07 | Discharge: 2018-01-07 | Disposition: A | Discharge status: Home / Self Care | Payer: Medicare Other | Source: Ambulatory Visit | Attending: Internal Medicine | Admitting: Internal Medicine

## 2018-01-07 DIAGNOSIS — R410 Disorientation, unspecified: Secondary | ICD-10-CM | POA: Diagnosis present

## 2018-01-07 LAB — URINALYSIS, ROUTINE W REFLEX MICROSCOPIC
BACTERIA UA: NONE SEEN
Bilirubin Urine: NEGATIVE
GLUCOSE, UA: 50 mg/dL — AB
KETONES UR: NEGATIVE mg/dL
LEUKOCYTES UA: NEGATIVE
NITRITE: NEGATIVE
PH: 6 (ref 5.0–8.0)
Protein, ur: 100 mg/dL — AB
Specific Gravity, Urine: 1.02 (ref 1.005–1.030)

## 2018-01-08 ENCOUNTER — Other Ambulatory Visit
Admission: RE | Admit: 2018-01-08 | Discharge: 2018-01-08 | Disposition: A | Discharge status: Home / Self Care | Payer: Medicare Other | Source: Ambulatory Visit | Attending: Internal Medicine | Admitting: Internal Medicine

## 2018-01-08 DIAGNOSIS — R410 Disorientation, unspecified: Secondary | ICD-10-CM | POA: Diagnosis present

## 2018-01-08 LAB — URINALYSIS, ROUTINE W REFLEX MICROSCOPIC
BACTERIA UA: NONE SEEN
Bilirubin Urine: NEGATIVE
GLUCOSE, UA: NEGATIVE mg/dL
HGB URINE DIPSTICK: NEGATIVE
Ketones, ur: NEGATIVE mg/dL
Leukocytes, UA: NEGATIVE
NITRITE: NEGATIVE
PROTEIN: 30 mg/dL — AB
Specific Gravity, Urine: 1.011 (ref 1.005–1.030)
pH: 6 (ref 5.0–8.0)

## 2018-01-08 LAB — URINE CULTURE

## 2018-01-09 LAB — URINE CULTURE: Culture: 30000 — AB

## 2018-01-12 ENCOUNTER — Encounter: Payer: Self-pay | Admitting: Adult Health

## 2018-01-12 ENCOUNTER — Non-Acute Institutional Stay (SKILLED_NURSING_FACILITY): Payer: Medicare Other | Admitting: Adult Health

## 2018-01-12 DIAGNOSIS — E1122 Type 2 diabetes mellitus with diabetic chronic kidney disease: Secondary | ICD-10-CM

## 2018-01-12 DIAGNOSIS — F0151 Vascular dementia with behavioral disturbance: Secondary | ICD-10-CM

## 2018-01-12 DIAGNOSIS — J441 Chronic obstructive pulmonary disease with (acute) exacerbation: Secondary | ICD-10-CM

## 2018-01-12 DIAGNOSIS — F01518 Vascular dementia, unspecified severity, with other behavioral disturbance: Secondary | ICD-10-CM

## 2018-01-12 DIAGNOSIS — N183 Chronic kidney disease, stage 3 (moderate): Secondary | ICD-10-CM | POA: Diagnosis not present

## 2018-01-12 NOTE — Progress Notes (Signed)
[facility]   [facility] Room Number: 204 A Place of Service:  SNF (31)   CODE STATUS: Full Code  Allergies  Allergen Reactions  . Prednisone Other (See Comments)    Pt states, "I just cant take it, I dont know"  . Axid [Nizatidine]   . Codeine Itching    Chief Complaint  Patient presents with  . Acute Visit    Care Plan Meeting    HPI:  We have come together for his routine care plan meeting. He does have family present. He is unable to return back home due to his confusion; his needs cannot not be met at home. He will require 24 hour supervision; preferably in assisted living or memory care. He was wondering at home; and had paranoid thoughts. He will not take his medications at times and will not use his cpap. His cbg readings have been too rigidly controlled. He will need speech therapy to see him for cognition. He denies any uncontrolled pain no changes in appetite; no anxiety    Past Medical History:  Diagnosis Date  . Chronic systolic CHF (congestive heart failure) ([place]) 01/01/2018  . COPD (chronic obstructive pulmonary disease) ([place])   . Coronary artery disease   . Diabetes mellitus without complication ([place])   . Hypertension   . Myocardial infarction [facility])    95' 97' 98'    Past Surgical History:  Procedure Laterality Date  . Fractured leg Right   . IMPLANTABLE CARDIOVERTER DEFIBRILLATOR (ICD) GENERATOR CHANGE N/A 09/06/2015   Procedure: ICD GENERATOR CHANGE;  Surgeon: Isaias Cowman, MD;  Location: [facility] ORS;  Service: Cardiovascular;  Laterality: N/A;  . IMPLANTABLE CARDIOVERTER DEFIBRILLATOR (ICD) GENERATOR CHANGE Left 09/13/2015   Procedure: ICD GENERATOR CHANGE/REVISION;  Surgeon: Isaias Cowman, MD;  Location: [facility] ORS;  Service: Cardiovascular;  Laterality: Left;  . Stents N/A    4 cardiac stents    Social History   Socioeconomic History  . Marital status: Widowed    Spouse name: Not on file  . Number of children:  Not on file  . Years of education: Not on file  . Highest education level: Not on file  Occupational History  . Occupation: disabled  Social Needs  . Financial resource strain: Not on file  . Food insecurity:    Worry: Not on file    Inability: Not on file  . Transportation needs:    Medical: Not on file    Non-medical: Not on file  Tobacco Use  . Smoking status: Former Smoker    Last attempt to quit: 07/19/2004    Years since quitting: 13.4  . Smokeless tobacco: Never Used  Substance and Sexual Activity  . Alcohol use: No  . Drug use: No  . Sexual activity: Not on file  Lifestyle  . Physical activity:    Days per week: Not on file    Minutes per session: Not on file  . Stress: Not on file  Relationships  . Social connections:    Talks on phone: Not on file    Gets together: Not on file    Attends religious service: Not on file    Active member of club or organization: Not on file    Attends meetings of clubs or organizations: Not on file    Relationship status: Not on file  . Intimate partner violence:    Fear of current or ex partner: Not on file    Emotionally abused: Not on file  Physically abused: Not on file    Forced sexual activity: Not on file  Other Topics Concern  . Not on file  Social History Narrative  . Not on file   History reviewed. No pertinent family history.    VITAL SIGNS BP (!) 148/75   Pulse 62   Temp 98.2 F (36.8 C)   Resp 16   Ht 5' 4"  (1.626 m)   Wt 208 lb 6.4 oz (94.5 kg)   SpO2 95%   BMI 35.77 kg/m   Outpatient Encounter Medications as of 01/12/2018  Medication Sig  . albuterol (PROVENTIL HFA;VENTOLIN HFA) 108 (90 Base) MCG/ACT inhaler Inhale 1-2 puffs into the lungs every 6 (six) hours as needed for wheezing or shortness of breath.  Marland Kitchen amiodarone (PACERONE) 200 MG tablet Take 1 tablet (200 mg total) by mouth daily.  Marland Kitchen aspirin EC 81 MG tablet Take 81 mg by mouth daily.  Marland Kitchen atorvastatin (LIPITOR) 40 MG tablet Take 1 tablet (40  mg total) by mouth daily at 6 PM.  . carvedilol (COREG) 12.5 MG tablet Take 12.5 mg by mouth 2 (two) times daily with a meal.  . dexamethasone (DECADRON) 2 MG tablet Take 2 mg by mouth 2 (two) times daily with a meal.  . famotidine (PEPCID) 20 MG tablet Take 1 tablet (20 mg total) by mouth daily.  Marland Kitchen glipiZIDE (GLUCOTROL XL) 10 MG 24 hr tablet Take 1 tablet (10 mg total) by mouth daily with breakfast.  . magnesium hydroxide (MILK OF MAGNESIA) 400 MG/5ML suspension Take 30 mLs by mouth every 4 (four) hours as needed for mild constipation.  . metFORMIN (GLUCOPHAGE) 500 MG tablet Take 500 mg by mouth daily.   . mometasone-formoterol (DULERA) 200-5 MCG/ACT AERO Inhale 2 puffs into the lungs 2 (two) times daily.  . nitroGLYCERIN (NITROSTAT) 0.4 MG SL tablet Place 0.4 mg under the tongue every 5 (five) minutes as needed for chest pain.  . NON FORMULARY Diet Type:  NCS, NAS  . OXYGEN Inhale 2 L/min into the lungs continuous.  Marland Kitchen QUEtiapine (SEROQUEL) 25 MG tablet Take 1 tablet (25 mg total) by mouth at bedtime.  Marland Kitchen UNABLE TO FIND CPAP at Bedtime  . [DISCONTINUED] carvedilol (COREG) 12.5 MG tablet Take 1 tablet (12.5 mg total) by mouth 2 (two) times daily with a meal. (Patient not taking: Reported on 01/12/2018)  . [DISCONTINUED] traZODone (DESYREL) 100 MG tablet Take 1 tablet (100 mg total) by mouth at bedtime. (Patient not taking: Reported on 01/12/2018)   No facility-administered encounter medications on file as of 01/12/2018.      SIGNIFICANT DIAGNOSTIC EXAMS  TODAY:   03-05-17: glucose 207; bun 35; creat 1.78; k+ 4.9; na++ 138; ca 8.4; tsh 1.134; vit B 12: 223 01-08-18: urine culture: no growth    Review of Systems  Reason unable to perform ROS: poor historian   Constitutional: Negative for malaise/fatigue.  Respiratory: Negative for cough.   Cardiovascular: Negative for chest pain and leg swelling.  Gastrointestinal: Negative for heartburn.  Musculoskeletal: Negative for joint pain.    Skin: Negative.   Psychiatric/Behavioral: The patient is not nervous/anxious.    Physical Exam  Constitutional: He appears well-developed and well-nourished. No distress.  Neck: No thyromegaly present.  Cardiovascular: Normal rate, regular rhythm, normal heart sounds and intact distal pulses.  Has ICD   Pulmonary/Chest: Effort normal and breath sounds normal. No respiratory distress.  Abdominal: Soft. Bowel sounds are normal. He exhibits no distension. There is no tenderness.  Musculoskeletal: He exhibits no  edema.  Is able to move all extremities   Lymphadenopathy:    He has no cervical adenopathy.  Neurological: He is alert.  Skin: Skin is warm and dry. He is not diaphoretic.  Psychiatric: He has a normal mood and affect.     ASSESSMENT/ PLAN:  TODAY   1. Vascular dementia with behavioral disturbance 2. DM type 2 with renal complications 3. COPD exacerbation:   Will lower glipizide to 5 mg daily  Will have speech therapy see him for cognition Will begin aricept 5 mg nightly     MD is aware of resident's narcotic use and is in agreement with current plan of care. We will attempt to wean resident as apropriate   Ok Edwards NP [facility]  Contact [phone] Monday through Friday 8am- 5pm  After hours call [phone]

## 2018-01-14 ENCOUNTER — Encounter: Payer: Self-pay | Admitting: Adult Health

## 2018-01-14 ENCOUNTER — Non-Acute Institutional Stay (SKILLED_NURSING_FACILITY): Payer: Medicare Other | Admitting: Adult Health

## 2018-01-14 DIAGNOSIS — I25118 Atherosclerotic heart disease of native coronary artery with other forms of angina pectoris: Secondary | ICD-10-CM | POA: Diagnosis not present

## 2018-01-14 DIAGNOSIS — F0151 Vascular dementia with behavioral disturbance: Secondary | ICD-10-CM

## 2018-01-14 DIAGNOSIS — F22 Delusional disorders: Secondary | ICD-10-CM

## 2018-01-14 DIAGNOSIS — J441 Chronic obstructive pulmonary disease with (acute) exacerbation: Secondary | ICD-10-CM | POA: Diagnosis not present

## 2018-01-14 DIAGNOSIS — N183 Chronic kidney disease, stage 3 (moderate): Secondary | ICD-10-CM

## 2018-01-14 DIAGNOSIS — E785 Hyperlipidemia, unspecified: Secondary | ICD-10-CM

## 2018-01-14 DIAGNOSIS — I5022 Chronic systolic (congestive) heart failure: Secondary | ICD-10-CM | POA: Diagnosis not present

## 2018-01-14 DIAGNOSIS — F01518 Vascular dementia, unspecified severity, with other behavioral disturbance: Secondary | ICD-10-CM

## 2018-01-14 DIAGNOSIS — E1169 Type 2 diabetes mellitus with other specified complication: Secondary | ICD-10-CM

## 2018-01-14 DIAGNOSIS — I499 Cardiac arrhythmia, unspecified: Secondary | ICD-10-CM

## 2018-01-14 DIAGNOSIS — E1122 Type 2 diabetes mellitus with diabetic chronic kidney disease: Secondary | ICD-10-CM

## 2018-01-14 NOTE — Progress Notes (Signed)
[facility]   [facility] Room Number: 204 A Place of Service:  SNF (31)   CODE STATUS: Full Code  Allergies  Allergen Reactions  . Prednisone Other (See Comments)    Pt states, "I just cant take it, I dont know"  . Axid [Nizatidine]   . Codeine Itching    Chief Complaint  Patient presents with  . Medical Management of Chronic Issues    Chronic diastolic CHF: ventricular arrhythmia; coronary artery disease involving native coronary artery of native heart with other form of angina pectoris. Weekly follow up for the first 30 days post hospitalization     HPI:  He is a 67 year old short term rehab patient being seen for the management of his chronic illnesses: chf; ventricular arrhythmia; cad. He denies any chest pain; no shortness of breath; no complaints of palpitations; no changes in appetite.   Past Medical History:  Diagnosis Date  . Chronic systolic CHF (congestive heart failure) ([place]) 01/01/2018  . COPD (chronic obstructive pulmonary disease) ([place])   . Coronary artery disease   . Diabetes mellitus without complication ([place])   . Hypertension   . Myocardial infarction [facility])    95' 97' 98'    Past Surgical History:  Procedure Laterality Date  . Fractured leg Right   . IMPLANTABLE CARDIOVERTER DEFIBRILLATOR (ICD) GENERATOR CHANGE N/A 09/06/2015   Procedure: ICD GENERATOR CHANGE;  Surgeon: Isaias Cowman, MD;  Location: [facility] ORS;  Service: Cardiovascular;  Laterality: N/A;  . IMPLANTABLE CARDIOVERTER DEFIBRILLATOR (ICD) GENERATOR CHANGE Left 09/13/2015   Procedure: ICD GENERATOR CHANGE/REVISION;  Surgeon: Isaias Cowman, MD;  Location: [facility] ORS;  Service: Cardiovascular;  Laterality: Left;  . Stents N/A    4 cardiac stents    Social History   Socioeconomic History  . Marital status: Widowed    Spouse name: Not on file  . Number of children: Not on file  . Years of education: Not on file  . Highest education level: Not on file    Occupational History  . Occupation: disabled  Social Needs  . Financial resource strain: Not on file  . Food insecurity:    Worry: Not on file    Inability: Not on file  . Transportation needs:    Medical: Not on file    Non-medical: Not on file  Tobacco Use  . Smoking status: Former Smoker    Last attempt to quit: 07/19/2004    Years since quitting: 13.4  . Smokeless tobacco: Never Used  Substance and Sexual Activity  . Alcohol use: No  . Drug use: No  . Sexual activity: Not on file  Lifestyle  . Physical activity:    Days per week: Not on file    Minutes per session: Not on file  . Stress: Not on file  Relationships  . Social connections:    Talks on phone: Not on file    Gets together: Not on file    Attends religious service: Not on file    Active member of club or organization: Not on file    Attends meetings of clubs or organizations: Not on file    Relationship status: Not on file  . Intimate partner violence:    Fear of current or ex partner: Not on file    Emotionally abused: Not on file    Physically abused: Not on file    Forced sexual activity: Not on file  Other Topics Concern  . Not on file  Social History  Narrative  . Not on file   History reviewed. No pertinent family history.    VITAL SIGNS BP (!) 153/83   Pulse 66   Temp 97.6 F (36.4 C)   Resp (!) 22   Ht 5\' 4"  (1.626 m)   Wt 210 lb 12.8 oz (95.6 kg)   SpO2 96%   BMI 36.18 kg/m   Outpatient Encounter Medications as of 01/14/2018  Medication Sig  . albuterol (PROVENTIL HFA;VENTOLIN HFA) 108 (90 Base) MCG/ACT inhaler Inhale 1-2 puffs into the lungs every 6 (six) hours as needed for wheezing or shortness of breath.  Marland Kitchen amiodarone (PACERONE) 200 MG tablet Take 1 tablet (200 mg total) by mouth daily.  Marland Kitchen aspirin EC 81 MG tablet Take 81 mg by mouth daily.  Marland Kitchen atorvastatin (LIPITOR) 40 MG tablet Take 1 tablet (40 mg total) by mouth daily at 6 PM.  . carvedilol (COREG) 12.5 MG tablet Take 12.5  mg by mouth 2 (two) times daily with a meal.  . dexamethasone (DECADRON) 2 MG tablet Take 2 mg by mouth 2 (two) times daily with a meal.  . donepezil (ARICEPT) 5 MG tablet Take 5 mg by mouth at bedtime.  . famotidine (PEPCID) 20 MG tablet Take 1 tablet (20 mg total) by mouth daily.  Marland Kitchen glipiZIDE (GLUCOTROL) 5 MG tablet Take 5 mg by mouth daily before breakfast.  . magnesium hydroxide (MILK OF MAGNESIA) 400 MG/5ML suspension Take 30 mLs by mouth every 4 (four) hours as needed for mild constipation.  . Melatonin 3 MG TABS Take 1 tablet by mouth at bedtime.  . metFORMIN (GLUCOPHAGE) 500 MG tablet Take 500 mg by mouth daily.   . mometasone-formoterol (DULERA) 200-5 MCG/ACT AERO Inhale 2 puffs into the lungs 2 (two) times daily.  . nitroGLYCERIN (NITROSTAT) 0.4 MG SL tablet Place 0.4 mg under the tongue every 5 (five) minutes as needed for chest pain.  . NON FORMULARY Diet Type:  NCS, NAS  . OXYGEN Inhale 2 L/min into the lungs continuous.  Marland Kitchen QUEtiapine (SEROQUEL) 25 MG tablet Take 1 tablet (25 mg total) by mouth at bedtime.  Marland Kitchen UNABLE TO FIND CPAP at Bedtime  . [DISCONTINUED] glipiZIDE (GLUCOTROL XL) 10 MG 24 hr tablet Take 1 tablet (10 mg total) by mouth daily with breakfast. (Patient not taking: Reported on 01/14/2018)   No facility-administered encounter medications on file as of 01/14/2018.      SIGNIFICANT DIAGNOSTIC EXAMS   TODAY:   03-05-17: glucose 207; bun 35; creat 1.78; k+ 4.9; na++ 138; ca 8.4; tsh 1.134; vit B 12: 223 01-08-18: urine culture: no growth   NO NEW LABS.   Review of Systems  Reason unable to perform ROS: poor historian   Constitutional: Negative for malaise/fatigue.  Respiratory: Negative for cough.   Cardiovascular: Negative for chest pain and leg swelling.  Gastrointestinal: Negative for constipation and heartburn.  Musculoskeletal: Negative for back pain and joint pain.  Skin: Negative.   Psychiatric/Behavioral: The patient is not nervous/anxious.      Physical Exam  Constitutional: He is oriented to person, place, and time. He appears well-developed and well-nourished. No distress.  Neck: No thyromegaly present.  Cardiovascular: Normal rate, regular rhythm, normal heart sounds and intact distal pulses.  Has ICD   Pulmonary/Chest: Effort normal and breath sounds normal. No respiratory distress.  Abdominal: Soft. Bowel sounds are normal. He exhibits no distension. There is no tenderness.  Musculoskeletal: He exhibits no edema.  Is able to move all extremities   Lymphadenopathy:  He has no cervical adenopathy.  Neurological: He is alert and oriented to person, place, and time.  Skin: Skin is warm and dry. He is not diaphoretic.  Psychiatric: He has a normal mood and affect.     ASSESSMENT/ PLAN:  TODAY:   1. Chronic diastolic CHF has ICD: is stable will continue coreg 12.5 mg twice daily   2. 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 CAD is status post 4 stents: is stable; will continue asa 81 mg daily has prn ntg  4.  COPD with acute exacerbation: is stable will continue dulera 200/5 2 puffs twice daily has albuterol 2 puffs twice daily is on decadron 2 mg twice daily   5. Obstructive sleep apnea: is stable uses CPAP nightly   6. Type 2 diabetes mellitus with stage 3 chronic kidney disease without long term use of insulin: is stable will continue metformin 500 mg daily and glucotrol 5 mg daily   7. Dyslipidemia associated with type 2 diabetes mellitus: is stable will continue lipitor 40 mg daily   8. Vascular dementia with behavior disturbance: is without change: will continue aricept 5 mg daily   9. Delusion: is stable will continue seroquel 25 mg nightly        MD is aware of resident's narcotic use and is in agreement with current plan of care. We will attempt to wean resident as apropriate   Ok Edwards NP [facility]  Adult Medicine  Contact [phone] Monday through Friday 8am- 5pm  After hours call [phone]

## 2018-01-15 ENCOUNTER — Encounter: Payer: Self-pay | Admitting: Adult Health

## 2018-01-15 DIAGNOSIS — F01518 Vascular dementia, unspecified severity, with other behavioral disturbance: Secondary | ICD-10-CM | POA: Insufficient documentation

## 2018-01-15 DIAGNOSIS — E1129 Type 2 diabetes mellitus with other diabetic kidney complication: Secondary | ICD-10-CM

## 2018-01-15 DIAGNOSIS — F0151 Vascular dementia with behavioral disturbance: Secondary | ICD-10-CM | POA: Insufficient documentation

## 2018-01-15 HISTORY — DX: Type 2 diabetes mellitus with other diabetic kidney complication: E11.29

## 2018-01-17 ENCOUNTER — Emergency Department
Admission: EM | Admit: 2018-01-17 | Discharge: 2018-01-23 | Disposition: A | Discharge status: Other Acute Inpatient Hospital | Payer: Medicare Other | Attending: Student in an Organized Health Care Education/Training Program | Admitting: Student in an Organized Health Care Education/Training Program

## 2018-01-17 ENCOUNTER — Encounter: Payer: Self-pay | Admitting: Emergency Medicine

## 2018-01-17 ENCOUNTER — Emergency Department: Payer: Medicare Other

## 2018-01-17 ENCOUNTER — Other Ambulatory Visit: Payer: Self-pay

## 2018-01-17 DIAGNOSIS — Z7984 Long term (current) use of oral hypoglycemic drugs: Secondary | ICD-10-CM | POA: Diagnosis not present

## 2018-01-17 DIAGNOSIS — R4182 Altered mental status, unspecified: Secondary | ICD-10-CM | POA: Diagnosis present

## 2018-01-17 DIAGNOSIS — F0150 Vascular dementia without behavioral disturbance: Secondary | ICD-10-CM | POA: Diagnosis not present

## 2018-01-17 DIAGNOSIS — J449 Chronic obstructive pulmonary disease, unspecified: Secondary | ICD-10-CM | POA: Insufficient documentation

## 2018-01-17 DIAGNOSIS — I110 Hypertensive heart disease with heart failure: Secondary | ICD-10-CM | POA: Insufficient documentation

## 2018-01-17 DIAGNOSIS — I5022 Chronic systolic (congestive) heart failure: Secondary | ICD-10-CM | POA: Insufficient documentation

## 2018-01-17 DIAGNOSIS — Z87891 Personal history of nicotine dependence: Secondary | ICD-10-CM | POA: Insufficient documentation

## 2018-01-17 DIAGNOSIS — F064 Anxiety disorder due to known physiological condition: Secondary | ICD-10-CM | POA: Insufficient documentation

## 2018-01-17 DIAGNOSIS — E785 Hyperlipidemia, unspecified: Secondary | ICD-10-CM

## 2018-01-17 DIAGNOSIS — E1129 Type 2 diabetes mellitus with other diabetic kidney complication: Secondary | ICD-10-CM | POA: Diagnosis not present

## 2018-01-17 DIAGNOSIS — Z79899 Other long term (current) drug therapy: Secondary | ICD-10-CM | POA: Insufficient documentation

## 2018-01-17 DIAGNOSIS — Z7982 Long term (current) use of aspirin: Secondary | ICD-10-CM | POA: Diagnosis not present

## 2018-01-17 DIAGNOSIS — E1169 Type 2 diabetes mellitus with other specified complication: Secondary | ICD-10-CM | POA: Insufficient documentation

## 2018-01-17 DIAGNOSIS — F0390 Unspecified dementia without behavioral disturbance: Secondary | ICD-10-CM | POA: Insufficient documentation

## 2018-01-17 DIAGNOSIS — F22 Delusional disorders: Secondary | ICD-10-CM | POA: Diagnosis not present

## 2018-01-17 DIAGNOSIS — G4733 Obstructive sleep apnea (adult) (pediatric): Secondary | ICD-10-CM | POA: Insufficient documentation

## 2018-01-17 LAB — URINALYSIS, COMPLETE (UACMP) WITH MICROSCOPIC
Bacteria, UA: NONE SEEN
Bilirubin Urine: NEGATIVE
Glucose, UA: NEGATIVE mg/dL
KETONES UR: NEGATIVE mg/dL
Leukocytes, UA: NEGATIVE
Nitrite: NEGATIVE
PROTEIN: 100 mg/dL — AB
SQUAMOUS EPITHELIAL / LPF: NONE SEEN (ref 0–5)
Specific Gravity, Urine: 1.018 (ref 1.005–1.030)
pH: 5 (ref 5.0–8.0)

## 2018-01-17 LAB — COMPREHENSIVE METABOLIC PANEL
ALT: 35 U/L (ref 0–44)
AST: 37 U/L (ref 15–41)
Albumin: 3.4 g/dL — ABNORMAL LOW (ref 3.5–5.0)
Alkaline Phosphatase: 56 U/L (ref 38–126)
Anion gap: 7 (ref 5–15)
BILIRUBIN TOTAL: 0.4 mg/dL (ref 0.3–1.2)
BUN: 34 mg/dL — AB (ref 8–23)
CHLORIDE: 104 mmol/L (ref 98–111)
CO2: 27 mmol/L (ref 22–32)
Calcium: 7.8 mg/dL — ABNORMAL LOW (ref 8.9–10.3)
Creatinine, Ser: 1.49 mg/dL — ABNORMAL HIGH (ref 0.61–1.24)
GFR calc Af Amer: 55 mL/min — ABNORMAL LOW (ref >60)
GFR, EST NON AFRICAN AMERICAN: 48 mL/min — AB (ref >60)
GLUCOSE: 87 mg/dL (ref 70–99)
POTASSIUM: 4.7 mmol/L (ref 3.5–5.1)
Sodium: 138 mmol/L (ref 135–145)
Total Protein: 6 g/dL — ABNORMAL LOW (ref 6.5–8.1)

## 2018-01-17 LAB — CBC WITH DIFFERENTIAL/PLATELET
Abs Immature Granulocytes: 0.06 10*3/uL (ref 0.00–0.07)
BASOS ABS: 0 10*3/uL (ref 0.0–0.1)
Basophils Relative: 0 %
EOS ABS: 0.1 10*3/uL (ref 0.0–0.5)
EOS PCT: 1 %
HEMATOCRIT: 39.8 % (ref 39.0–52.0)
HEMOGLOBIN: 12.4 g/dL — AB (ref 13.0–17.0)
Immature Granulocytes: 1 %
LYMPHS PCT: 10 %
Lymphs Abs: 1.3 10*3/uL (ref 0.7–4.0)
MCH: 26.1 pg (ref 26.0–34.0)
MCHC: 31.2 g/dL (ref 30.0–36.0)
MCV: 83.6 fL (ref 80.0–100.0)
MONOS PCT: 7 %
Monocytes Absolute: 0.9 10*3/uL (ref 0.1–1.0)
Neutro Abs: 10.5 10*3/uL — ABNORMAL HIGH (ref 1.7–7.7)
Neutrophils Relative %: 81 %
PLATELETS: 159 10*3/uL (ref 150–400)
RBC: 4.76 MIL/uL (ref 4.22–5.81)
RDW: 18 % — AB (ref 11.5–15.5)
WBC: 12.9 10*3/uL — ABNORMAL HIGH (ref 4.0–10.5)
nRBC: 0 % (ref 0.0–0.2)

## 2018-01-17 MED ORDER — QUETIAPINE FUMARATE 25 MG PO TABS
25.0000 mg | ORAL_TABLET | Freq: Every day | ORAL | Status: DC
  Administered 2018-01-17 – 2018-01-22 (×6): 25 mg via ORAL
  Filled 2018-01-17 (×6): qty 1

## 2018-01-17 MED ORDER — HALOPERIDOL LACTATE 5 MG/ML IJ SOLN
5.0000 mg | Freq: Once | INTRAMUSCULAR | Status: AC
  Administered 2018-01-17: 5 mg via INTRAVENOUS

## 2018-01-17 MED ORDER — ASPIRIN EC 81 MG PO TBEC
81.0000 mg | DELAYED_RELEASE_TABLET | Freq: Every day | ORAL | Status: DC
  Administered 2018-01-17 – 2018-01-22 (×6): 81 mg via ORAL
  Filled 2018-01-17 (×6): qty 1

## 2018-01-17 MED ORDER — ATORVASTATIN CALCIUM 20 MG PO TABS
40.0000 mg | ORAL_TABLET | Freq: Every day | ORAL | Status: DC
  Administered 2018-01-17 – 2018-01-22 (×6): 40 mg via ORAL
  Filled 2018-01-17 (×6): qty 2

## 2018-01-17 MED ORDER — HALOPERIDOL LACTATE 5 MG/ML IJ SOLN
5.0000 mg | Freq: Once | INTRAMUSCULAR | Status: DC
  Filled 2018-01-17: qty 1

## 2018-01-17 MED ORDER — GLIPIZIDE 5 MG PO TABS
5.0000 mg | ORAL_TABLET | Freq: Every day | ORAL | Status: DC
  Administered 2018-01-18 – 2018-01-23 (×6): 5 mg via ORAL
  Filled 2018-01-17 (×6): qty 1

## 2018-01-17 MED ORDER — DONEPEZIL HCL 5 MG PO TABS
5.0000 mg | ORAL_TABLET | Freq: Every day | ORAL | Status: DC
  Administered 2018-01-17 – 2018-01-22 (×6): 5 mg via ORAL
  Filled 2018-01-17 (×7): qty 1

## 2018-01-17 MED ORDER — CARVEDILOL 12.5 MG PO TABS
12.5000 mg | ORAL_TABLET | Freq: Two times a day (BID) | ORAL | Status: DC
  Administered 2018-01-17 – 2018-01-23 (×12): 12.5 mg via ORAL
  Filled 2018-01-17: qty 1
  Filled 2018-01-17: qty 2
  Filled 2018-01-17 (×2): qty 1
  Filled 2018-01-17 (×5): qty 2
  Filled 2018-01-17 (×4): qty 1

## 2018-01-17 MED ORDER — IPRATROPIUM-ALBUTEROL 0.5-2.5 (3) MG/3ML IN SOLN
3.0000 mL | RESPIRATORY_TRACT | Status: DC
  Administered 2018-01-17 – 2018-01-19 (×5): 3 mL via RESPIRATORY_TRACT
  Filled 2018-01-17 (×9): qty 3

## 2018-01-17 MED ORDER — MOMETASONE FURO-FORMOTEROL FUM 200-5 MCG/ACT IN AERO
2.0000 | INHALATION_SPRAY | Freq: Two times a day (BID) | RESPIRATORY_TRACT | Status: DC
  Administered 2018-01-18 – 2018-01-22 (×9): 2 via RESPIRATORY_TRACT
  Filled 2018-01-17: qty 8.8

## 2018-01-17 MED ORDER — METFORMIN HCL 500 MG PO TABS
500.0000 mg | ORAL_TABLET | Freq: Every day | ORAL | Status: DC
  Administered 2018-01-17 – 2018-01-22 (×6): 500 mg via ORAL
  Filled 2018-01-17 (×6): qty 1

## 2018-01-17 MED ORDER — ALBUTEROL SULFATE HFA 108 (90 BASE) MCG/ACT IN AERS
1.0000 | INHALATION_SPRAY | Freq: Four times a day (QID) | RESPIRATORY_TRACT | Status: DC | PRN
  Filled 2018-01-17: qty 6.7

## 2018-01-17 MED ORDER — AMIODARONE HCL 200 MG PO TABS
200.0000 mg | ORAL_TABLET | Freq: Every day | ORAL | Status: DC
  Administered 2018-01-18 – 2018-01-22 (×4): 200 mg via ORAL
  Filled 2018-01-17 (×6): qty 1

## 2018-01-17 NOTE — ED Provider Notes (Signed)
[facility] Emergency Department Provider Note    None    (approximate)  I have reviewed the triage vital signs and the nursing notes.   HISTORY  Chief Complaint Altered Mental Status  Level V Caveat:  Dementia   HPI Lance Nunez is a 67 y.o. male   presents the ER for evaluation of paranoia and altered mental status.  Patient with recent hospitalization for COPD exacerbation currently living at [facility].  Does have known history of dementia.  No report of any falls.  Patient denies any shortness of breath or chest pain..  No report of any nausea or vomiting.  Chronic EMS and staff at the nursing facility patient was ambulating around the halls stating "please do not let them kill me ".  Apparently becoming more agitated.  According to his MAR has gotten a couple as needed doses of Ativan and Haldol but his behavior has deteriorated.   Past Medical History:  Diagnosis Date  . Chronic systolic CHF (congestive heart failure) ([place]) 01/01/2018  . COPD (chronic obstructive pulmonary disease) ([place])   . Coronary artery disease   . Diabetes mellitus without complication ([place])   . DM (diabetes mellitus), type 2 with renal complications ([place]) 40/98/1191  . Hypertension   . Myocardial infarction [facility])    95' 97' 98'   History reviewed. No pertinent family history. Past Surgical History:  Procedure Laterality Date  . Fractured leg Right   . IMPLANTABLE CARDIOVERTER DEFIBRILLATOR (ICD) GENERATOR CHANGE N/A 09/06/2015   Procedure: ICD GENERATOR CHANGE;  Surgeon: Isaias Cowman, MD;  Location: [facility] ORS;  Service: Cardiovascular;  Laterality: N/A;  . IMPLANTABLE CARDIOVERTER DEFIBRILLATOR (ICD) GENERATOR CHANGE Left 09/13/2015   Procedure: ICD GENERATOR CHANGE/REVISION;  Surgeon: Isaias Cowman, MD;  Location: [facility] ORS;  Service: Cardiovascular;  Laterality: Left;  . Stents N/A    4 cardiac stents   Patient Active Problem List   Diagnosis Date Noted   . DM (diabetes mellitus), type 2 with renal complications ([place]) 47/82/9562  . Vascular dementia with behavior disturbance ([place]) 01/15/2018  . COPD exacerbation ([place]) 01/02/2018  . CAD (coronary artery disease) 01/01/2018  . COPD with acute exacerbation ([place]) 01/01/2018  . Chronic systolic CHF (congestive heart failure) ([place]) 01/01/2018  . Acute respiratory failure with hypoxemia ([place]) 08/18/2017  . Chest pain 08/17/2017  . 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 Dilated cardiomyopathy ([place]) 09/10/2015  . Diabetes mellitus without complication ([place]) 13/09/6576  . HTN, goal below 140/80 09/10/2015      Prior to Admission medications   Medication Sig Start Date End Date Taking? Authorizing Provider  acetaminophen (TYLENOL) 325 MG tablet Take 650 mg by mouth every 4 (four) hours as needed for mild pain or moderate pain.   Yes [provider]  albuterol (PROVENTIL HFA;VENTOLIN HFA) 108 (90 Base) MCG/ACT inhaler Inhale 1-2 puffs into the lungs every 6 (six) hours as needed for wheezing or shortness of breath. 08/18/17  Yes Demetrios Loll, MD  amiodarone (PACERONE) 200 MG tablet Take 1 tablet (200 mg total) by mouth daily. 08/18/17  Yes Demetrios Loll, MD  aspirin EC 81 MG tablet Take 81 mg by mouth daily.   Yes [provider]  atorvastatin (LIPITOR) 40 MG tablet Take 1 tablet (40 mg total) by mouth daily at 6 PM. 08/18/17  Yes Demetrios Loll, MD  carvedilol (COREG) 12.5 MG tablet Take 12.5 mg by mouth 2 (two) times daily with a meal. 01/06/18  Yes [provider]  donepezil (ARICEPT) 5 MG tablet Take 5 mg by mouth at bedtime. 01/13/18  Yes [provider]  famotidine (PEPCID) 20 MG tablet Take 1 tablet (20 mg total) by mouth daily. 01/06/18 02/05/18 Yes Pyreddy, Reatha Harps, MD  glipiZIDE (GLUCOTROL) 5 MG tablet Take 5 mg by mouth daily before breakfast. 01/13/18  Yes [provider]  magnesium  hydroxide (MILK OF MAGNESIA) 400 MG/5ML suspension Take 30 mLs by mouth every 4 (four) hours as needed for mild constipation. 01/07/18  Yes [provider]  Melatonin 3 MG TABS Take 1 tablet by mouth at bedtime. 01/12/18  Yes [provider]  metFORMIN (GLUCOPHAGE) 500 MG tablet Take 500 mg by mouth daily.    Yes [provider]  mometasone-formoterol (DULERA) 200-5 MCG/ACT AERO Inhale 2 puffs into the lungs 2 (two) times daily. 08/19/17  Yes Demetrios Loll, MD  nitroGLYCERIN (NITROSTAT) 0.4 MG SL tablet Place 0.4 mg under the tongue every 5 (five) minutes as needed for chest pain.   Yes [provider]  NON FORMULARY Diet Type:  NCS, NAS   Yes [provider]  OXYGEN Inhale 2 L/min into the lungs continuous.   Yes [provider]  QUEtiapine (SEROQUEL) 25 MG tablet Take 1 tablet (25 mg total) by mouth at bedtime. 01/06/18 02/05/18 Yes Pyreddy, Reatha Harps, MD  UNABLE TO FIND CPAP at Bedtime   Yes [provider]    Allergies Prednisone; Axid [nizatidine]; and Codeine    Social History Social History   Tobacco Use  . Smoking status: Former Smoker    Last attempt to quit: 07/19/2004    Years since quitting: 13.5  . Smokeless tobacco: Never Used  Substance Use Topics  . Alcohol use: No  . Drug use: No    Review of Systems Patient denies headaches, rhinorrhea, blurry vision, numbness, shortness of breath, chest pain, edema, cough, abdominal pain, nausea, vomiting, diarrhea, dysuria, fevers, rashes or hallucinations unless otherwise stated above in HPI. ____________________________________________   PHYSICAL EXAM:  VITAL SIGNS: Vitals:   01/17/18 1630 01/17/18 1638  BP: (!) 144/102 (!) 147/73  Pulse: 68 70  Resp: (!) 25   Temp:    SpO2: (!) 89%     Constitutional: Alert, anxious appearing, non toxic Eyes: Conjunctivae are normal.  Head: Atraumatic. Nose: No congestion/rhinnorhea. Mouth/Throat: Mucous membranes are moist.     Neck: No stridor. Painless ROM.  Cardiovascular: Normal rate, regular rhythm. Grossly normal heart sounds.  Good peripheral circulation. Respiratory: Normal respiratory effort.  No retractions. Lungs CTAB. Gastrointestinal: Soft and nontender. No distention. No abdominal bruits. No CVA tenderness. Genitourinary:  Musculoskeletal: No lower extremity tenderness nor edema.  No joint effusions. Neurologic:  Normal speech and language. No gross focal neurologic deficits are appreciated. No facial droop Skin:  Skin is warm, dry and intact. No rash noted. Psychiatric: Mood and affect are anxious and disorganized  ____________________________________________   LABS (all labs ordered are listed, but only abnormal results are displayed)  Results for orders placed or performed during the hospital encounter of 01/17/18 (from the past 24 hour(s))  CBC with Differential/Platelet     Status: Abnormal   Collection Time: 01/17/18  3:58 PM  Result Value Ref Range   WBC 12.9 (H) 4.0 - 10.5 K/uL   RBC 4.76 4.22 - 5.81 MIL/uL   Hemoglobin 12.4 (L) 13.0 - 17.0 g/dL   HCT 39.8 39.0 - 52.0 %   MCV 83.6 80.0 - 100.0 fL   MCH 26.1  26.0 - 34.0 pg   MCHC 31.2 30.0 - 36.0 g/dL   RDW 18.0 (H) 11.5 - 15.5 %   Platelets 159 150 - 400 K/uL   nRBC 0.0 0.0 - 0.2 %   Neutrophils Relative % 81 %   Neutro Abs 10.5 (H) 1.7 - 7.7 K/uL   Lymphocytes Relative 10 %   Lymphs Abs 1.3 0.7 - 4.0 K/uL   Monocytes Relative 7 %   Monocytes Absolute 0.9 0.1 - 1.0 K/uL   Eosinophils Relative 1 %   Eosinophils Absolute 0.1 0.0 - 0.5 K/uL   Basophils Relative 0 %   Basophils Absolute 0.0 0.0 - 0.1 K/uL   Immature Granulocytes 1 %   Abs Immature Granulocytes 0.06 0.00 - 0.07 K/uL  Comprehensive metabolic panel     Status: Abnormal   Collection Time: 01/17/18  3:58 PM  Result Value Ref Range   Sodium 138 135 - 145 mmol/L   Potassium 4.7 3.5 - 5.1 mmol/L   Chloride 104 98 - 111 mmol/L   CO2 27 22 - 32 mmol/L   Glucose, Bld  87 70 - 99 mg/dL   BUN 34 (H) 8 - 23 mg/dL   Creatinine, Ser 1.49 (H) 0.61 - 1.24 mg/dL   Calcium 7.8 (L) 8.9 - 10.3 mg/dL   Total Protein 6.0 (L) 6.5 - 8.1 g/dL   Albumin 3.4 (L) 3.5 - 5.0 g/dL   AST 37 15 - 41 U/L   ALT 35 0 - 44 U/L   Alkaline Phosphatase 56 38 - 126 U/L   Total Bilirubin 0.4 0.3 - 1.2 mg/dL   GFR calc non Af Amer 48 (L) >60 mL/min   GFR calc Af Amer 55 (L) >60 mL/min   Anion gap 7 5 - 15  Urinalysis, Complete w Microscopic     Status: Abnormal   Collection Time: 01/17/18  3:58 PM  Result Value Ref Range   Color, Urine YELLOW (A) YELLOW   APPearance CLEAR (A) CLEAR   Specific Gravity, Urine 1.018 1.005 - 1.030   pH 5.0 5.0 - 8.0   Glucose, UA NEGATIVE NEGATIVE mg/dL   Hgb urine dipstick SMALL (A) NEGATIVE   Bilirubin Urine NEGATIVE NEGATIVE   Ketones, ur NEGATIVE NEGATIVE mg/dL   Protein, ur 100 (A) NEGATIVE mg/dL   Nitrite NEGATIVE NEGATIVE   Leukocytes, UA NEGATIVE NEGATIVE   RBC / HPF 0-5 0 - 5 RBC/hpf   WBC, UA 0-5 0 - 5 WBC/hpf   Bacteria, UA NONE SEEN NONE SEEN   Squamous Epithelial / LPF NONE SEEN 0 - 5   Mucus PRESENT    ____________________________________________  ____________________________________________  RADIOLOGY  I personally reviewed all radiographic images ordered to evaluate for the above acute complaints and reviewed radiology reports and findings.  These findings were personally discussed with the patient.  Please see medical record for radiology report.  ____________________________________________   PROCEDURES  Procedure(s) performed:  Procedures    Critical Care performed: no ____________________________________________   INITIAL IMPRESSION / ASSESSMENT AND PLAN / ED COURSE  Pertinent labs & imaging results that were available during my care of the patient were reviewed by me and considered in my medical decision making (see chart for details).   DDX: Dehydration, sepsis, pna, uti, hypoglycemia, cva, drug effect,  withdrawal, encephalitis   Lance Nunez is a 67 y.o. who presents to the ED with symptoms as described above.  Patient very anxious and paranoid appearing.  Will check blood work chest x-ray evaluate  for underlying metabolic abnormality however do worried the patient is having more agitation and paranoia secondary to underlying dementia.  No evidence of trauma.  No focal neuro deficits.  The patient will be placed on continuous pulse oximetry and telemetry for monitoring.  Laboratory evaluation will be sent to evaluate for the above complaints.     Clinical Course as of Jan 18 1952  Sat Jan 17, 2018  1610 Family reports that there had not been any fever is been there now bedside and states that they have also been getting reports of increased agitation particularly at night and patient was reportedly threatening to kill himself stating if he had a gun he would which she were dead shoot himself.  He was recently diagnosed with vascular dementia.  Do have high suspicion that that is causing the patient's presentation.  Mild leukocytosis likely secondary to recent steroids for COPD exacerbation.  He is afebrile otherwise no other symptoms or signs of sepsis.   [PR]  1628 Blood work is reassuring at baseline.  This point do believe patient will need to be placed under IVC's are also reported the patient is making threatening statements and SI.   [PR]    Clinical Course User Index [PR] Merlyn Lot, MD     As part of my medical decision making, I reviewed the following data within the [place] notes reviewed and incorporated, Labs reviewed, notes from prior ED visits.  ____________________________________________   FINAL CLINICAL IMPRESSION(S) / ED DIAGNOSES  Final diagnoses:  Paranoia ([place])      NEW MEDICATIONS STARTED DURING THIS VISIT:  New Prescriptions   No medications on file     Note:  This document was prepared using Dragon voice  recognition software and may include unintentional dictation errors.    Merlyn Lot, MD 01/17/18 [phone]

## 2018-01-17 NOTE — ED Triage Notes (Signed)
Per EMS, staff at [facility] called stating that pt is more agitated and confused today, saying things like "don't let anyone kill me".  Baseline uncertain.  Oriented to self now.  Hx:  Dementia.  Blood sugar 97.  94% on room air (normal).

## 2018-01-17 NOTE — ED Notes (Signed)
Pt. Does not like breathing treatments or c-pap, patient believes it is killing him, even when patient is told he needs such medications.

## 2018-01-17 NOTE — ED Notes (Signed)
SOC called. 

## 2018-01-17 NOTE — ED Notes (Signed)
Pt dressed out. Pt family took Pt belongings bag with them.

## 2018-01-17 NOTE — BH Assessment (Signed)
Assessment Note  Lance Nunez is an 67 y.o. male.   Diagnosis: Dementia   Patient presents to [facility]-ED from [facility] due to agitation and confusion. Patient lives alone and is a widow. When asked if patient is having thoughts of harming himself patient stated " I don't know." Patient denies HI, however endorses AVH. Patient states he sees "clocks on the wall going around and around" and "smoke in the air". Patient states he is only getting 3 or 4 hours of sleep. Patient presents paranoid asking writer repeatedly not to lock to the door and "people are going to kill me."  Per patient's daughter, Ancil Linsey ([phone]) patient stated " If he had a gun he would kill himself. Daughter states patient has COPD and was admitted to [facility] for rehab for 9-10 days. Daughter states patient was diagnosed with vascular dementia last Monday, and that is when the hallucinations started.   Patient presents oriented x 3, and preoccupied during assessment.  Patient denied illicit substance and alcohol use.  Patient doesn't currently have any involvement in the legal system.  Patient denies previous inpatient psychiatric treatment and outpatient mental health providers.   Past Medical History:  Past Medical History:  Diagnosis Date  . Chronic systolic CHF (congestive heart failure) ([place]) 01/01/2018  . COPD (chronic obstructive pulmonary disease) ([place])   . Coronary artery disease   . Diabetes mellitus without complication ([place])   . DM (diabetes mellitus), type 2 with renal complications ([place]) 32/44/0102  . Hypertension   . Myocardial infarction [facility])    95' 97' 98'    Past Surgical History:  Procedure Laterality Date  . Fractured leg Right   . IMPLANTABLE CARDIOVERTER DEFIBRILLATOR (ICD) GENERATOR CHANGE N/A 09/06/2015   Procedure: ICD GENERATOR CHANGE;  Surgeon: Isaias Cowman, MD;  Location: [facility] ORS;  Service: Cardiovascular;   Laterality: N/A;  . IMPLANTABLE CARDIOVERTER DEFIBRILLATOR (ICD) GENERATOR CHANGE Left 09/13/2015   Procedure: ICD GENERATOR CHANGE/REVISION;  Surgeon: Isaias Cowman, MD;  Location: [facility] ORS;  Service: Cardiovascular;  Laterality: Left;  . Stents N/A    4 cardiac stents    Family History: History reviewed. No pertinent family history.  Social History:  reports that he quit smoking about 13 years ago. He has never used smokeless tobacco. He reports that he does not drink alcohol or use drugs.  Additional Social History:  Alcohol / Drug Use Pain Medications: SEE PTA  Prescriptions: SEE PTA  Over the Counter: SEE PTA  History of alcohol / drug use?: No history of alcohol / drug abuse Longest period of sobriety (when/how long): None reported   CIWA: CIWA-Ar BP: (!) 147/73 Pulse Rate: 70 COWS:    Allergies:  Allergies  Allergen Reactions  . Prednisone Other (See Comments)    Pt states, "I just cant take it, I dont know"  . Axid [Nizatidine]   . Codeine Itching    Home Medications:  (Not in a hospital admission)  OB/GYN Status:  No LMP for male patient.  General Assessment Data Assessment unable to be completed: (Assessment completed ) Location of Assessment: [facility] ED TTS Assessment: In system Is this a Tele or Face-to-Face Assessment?: Face-to-Face Is this an Initial Assessment or a Re-assessment for this encounter?: Initial Assessment Patient Accompanied by:: Other(Daughter and son-in-law) Language Other than English: No Living Arrangements: Other (Comment)(Lives alone ) What gender do you identify as?: Male Marital status: Widowed [place] name: N/A Pregnancy Status: No Living Arrangements: Alone Can pt return to current  living arrangement?: Yes Admission Status: Involuntary Petitioner: ED Attending Is patient capable of signing voluntary admission?: No Referral Source: Self/Family/Friend Insurance type: [place] Screening Exam ([place]) Medical  Exam completed: Yes  Crisis Care Plan Living Arrangements: Alone Legal Guardian: Other:(None reported ) Name of Psychiatrist: None reported  Name of Therapist: None reported   Education Status Is patient currently in school?: No Is the patient employed, unemployed or receiving disability?: Receiving disability income, Unemployed  Risk to self with the past 6 months Suicidal Ideation: No Has patient been a risk to self within the past 6 months prior to admission? : No Suicidal Intent: No Has patient had any suicidal intent within the past 6 months prior to admission? : No Is patient at risk for suicide?: No Suicidal Plan?: No Has patient had any suicidal plan within the past 6 months prior to admission? : No Access to Means: No What has been your use of drugs/alcohol within the last 12 months?: None reported  Previous Attempts/Gestures: No How many times?: 0 Other Self Harm Risks: None reported  Triggers for Past Attempts: Other (Comment)(None reported ) Intentional Self Injurious Behavior: None Family Suicide History: Unknown Recent stressful life event(s): Recent negative physical changes Persecutory voices/beliefs?: Yes Depression: Yes Depression Symptoms: Despondent Substance abuse history and/or treatment for substance abuse?: No Suicide prevention information given to non-admitted patients: Not applicable  Risk to Others within the past 6 months Homicidal Ideation: No Does patient have any lifetime risk of violence toward others beyond the six months prior to admission? : No Thoughts of Harm to Others: No Current Homicidal Intent: No Current Homicidal Plan: No Access to Homicidal Means: No Identified Victim: None reported  History of harm to others?: No Assessment of Violence: None Noted Violent Behavior Description: None reported  Does patient have access to weapons?: No Criminal Charges Pending?: No Does patient have a court date: No Is patient on probation?:  No  Psychosis Hallucinations: Auditory, Visual Delusions: None noted  Mental Status Report Appearance/Hygiene: In scrubs Eye Contact: Poor Motor Activity: Tremors Speech: Soft Level of Consciousness: Alert Mood: Preoccupied Affect: Preoccupied Anxiety Level: None Thought Processes: Tangential Judgement: Impaired Orientation: Person, Place, Time, Situation Obsessive Compulsive Thoughts/Behaviors: None  Cognitive Functioning Concentration: Fair Memory: Recent Impaired, Remote Impaired Is patient IDD: No Insight: Poor Impulse Control: Poor Appetite: Fair Have you had any weight changes? : No Change Sleep: Decreased Total Hours of Sleep: 3 Vegetative Symptoms: None  ADLScreening [facility] Assessment Services) Patient's cognitive ability adequate to safely complete daily activities?: Yes Patient able to express need for assistance with ADLs?: Yes Independently performs ADLs?: Yes (appropriate for developmental age)  Prior Inpatient Therapy Prior Inpatient Therapy: No  Prior Outpatient Therapy Prior Outpatient Therapy: No Does patient have an ACCT team?: No Does patient have Intensive In-House Services?  : No Does patient have [facility] services? : No Does patient have P4CC services?: No  ADL Screening (condition at time of admission) Patient's cognitive ability adequate to safely complete daily activities?: Yes Is the patient deaf or have difficulty hearing?: No Does the patient have difficulty seeing, even when wearing glasses/contacts?: No Does the patient have difficulty concentrating, remembering, or making decisions?: Yes Patient able to express need for assistance with ADLs?: Yes Does the patient have difficulty dressing or bathing?: Yes Independently performs ADLs?: Yes (appropriate for developmental age) Does the patient have difficulty walking or climbing stairs?: No Weakness of Legs: None Weakness of Arms/Hands: None  Home Assistive Devices/Equipment Home  Assistive Devices/Equipment: None  Therapy Consults (therapy consults require a physician order) PT Evaluation Needed: No OT Evalulation Needed: No SLP Evaluation Needed: No Abuse/Neglect Assessment (Assessment to be complete while patient is alone) Abuse/Neglect Assessment Can Be Completed: Yes Physical Abuse: Denies Verbal Abuse: Denies Sexual Abuse: Denies Exploitation of patient/patient's resources: Denies Self-Neglect: Denies Values / Beliefs Cultural Requests During Hospitalization: None Spiritual Requests During Hospitalization: None Consults Spiritual Care Consult Needed: No Social Work Consult Needed: Yes (Comment) Advance Directives (For Healthcare) Does Patient Have a Medical Advance Directive?: No          Disposition:  Disposition Initial Assessment Completed for this Encounter: Yes Patient referred to: Other (Comment)(pending [facility] consult )  On Site Evaluation by:   Reviewed with Physician:    Jodie Echevaria, [place], LCASA 01/17/2018 5:59 PM

## 2018-01-17 NOTE — ED Notes (Signed)
Patient assigned to appropriate care area   Introduced self to pt  and family. Patient and family oriented to unit/care area: Informed that, for their safety, care areas are designed for safety and visiting and phone hours explained to patient. Patient verbalizes understanding, and verbal contract for safety obtained  Environment secured    Per EMS, staff at [facility] called stating that pt is more agitated and confused today, saying things like "don't let anyone kill me".  Baseline uncertain.  Oriented to self now.  Hx:  Dementia.  Blood sugar 97.  94% on room air (normal).

## 2018-01-17 NOTE — ED Notes (Signed)
Patients sister and friend had to go home.

## 2018-01-18 NOTE — ED Notes (Signed)
Nurse gave report to [facility].

## 2018-01-18 NOTE — ED Notes (Signed)
Patient only ate a small portion of lunch and had beverage.

## 2018-01-18 NOTE — ED Notes (Signed)
Hourly rounding reveals patient in room. No complaints, stable, in no acute distress. Q15 minute rounds and monitoring via Rover and Officer to continue.   

## 2018-01-18 NOTE — ED Notes (Signed)
Nurse spoke with daughter on the phone and she states that she was having to go to [facility] daily to help with her dads care and He was being sedated due to His anxiety, she state " I don't know what to do' nurse told her to call back and that maybe there would be more information on plan, sounds like He needs higher level of care. Patient has been cooperative, but He says things like" yall are trying to kill me' I'm going to die" Patient can be easily redirected. q 15 minute checks and camera surveillance in progress for safety.

## 2018-01-18 NOTE — ED Notes (Signed)
Pt awake sitting at bedside. Will continue to monitor Q15 mins.

## 2018-01-18 NOTE — ED Notes (Signed)
Snack and beverage given. 

## 2018-01-18 NOTE — ED Notes (Signed)
Nurse encouraged Patient to use the bathroom and He said" No I don't have to pee because Im dead" nurse ask him to sit on the toilet to at least try to void and Patient did void, and nurse changed His brief with assist of Tech. Patient is now sitting back on bed, He talks about dying often and states " no one cares' patient is cooperative and very redirectable.

## 2018-01-18 NOTE — ED Notes (Signed)
Patient only ate a couple of bites for supper and had beverage.

## 2018-01-18 NOTE — BH Assessment (Signed)
Writer spoke with ER MD (Dr. Kerman Passey) about patient and not receiving his [place] at this time. ER MD re-ordered the [facility]. Writer updated ER Sect. ([place]) and she stated she will call in the [facility].

## 2018-01-18 NOTE — ED Notes (Signed)
Patient alert and oriented to person and place, warm and dry, in no acute distress. Unable to assess SI, HI, AVH . Pt appears to be confused of situation. He said " I am here to be prosecuted. You know they will kill me". Patient made aware of Q15 minute rounds and Engineer, drilling presence for their safety. Patient instructed to come to me with needs or concerns.

## 2018-01-18 NOTE — ED Notes (Signed)
Pt. Woke up and came to door.  Pt. Was confused, pt. Did not know where he was.  This nurse explained to patient he was at [facility] in [place].  Pt. Was still confused, this nurse spoke with patient on how he got here.  Pt. Asking to go home.  Spoke to the patient in great leanth

## 2018-01-18 NOTE — ED Provider Notes (Signed)
-----------------------------------------   6:41 AM on 01/18/2018 -----------------------------------------   Blood pressure 113/72, pulse 73, temperature 98.1 F (36.7 C), resp. rate (!) 22, height 5\' 4"  (1.626 m), weight 100.2 kg, SpO2 92 %.  The patient had no acute events since last update.  Calm and cooperative at this time.  Reconsult [facility] psychiatry this morning so they may obtain collateral information from patient's sister as patient is too confused to participate in interview.  Disposition is pending Psychiatry/Behavioral Medicine team recommendations.     Paulette Blanch, MD 01/18/18 [phone]

## 2018-01-18 NOTE — ED Notes (Signed)
IVC pt, SOC recommends inpt admit

## 2018-01-18 NOTE — ED Notes (Signed)
Report from [facility]. Patient sleeping, respirations regular and unlabored. Q15 minute rounds and observation by Engineer, drilling to continue.

## 2018-01-18 NOTE — NC FL2 (Signed)
[place] LEVEL OF CARE SCREENING TOOL     IDENTIFICATION  Patient Name: Lance Nunez Birthdate: 1950-06-25 Sex: male Admission Date (Current Location): 01/17/2018  [place] and [place] Number:  Engineering geologist and Address:  [facility], [address] [place], [place] [address]      Provider Number: [id number]  Attending Physician Name and Address:  No att. providers found  Relative Name and Phone Number:       Current Level of Care: Hospital Recommended Level of Care: [place] Prior Approval Number:    Date Approved/Denied:   PASRR Number: [id number]  Discharge Plan: SNF    Current Diagnoses: Patient Active Problem List   Diagnosis Date Noted  . Obstructive sleep apnea 01/17/2018  . Dyslipidemia associated with type 2 diabetes mellitus ([place]) 01/17/2018  . 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 DM (diabetes mellitus), type 2 with renal complications ([place]) 00/86/7619  . Vascular dementia with behavior disturbance ([place]) 01/15/2018  . COPD exacerbation ([place]) 01/02/2018  . CAD (coronary artery disease) 01/01/2018  . COPD with acute exacerbation ([place]) 01/01/2018  . Chronic systolic CHF (congestive heart failure) ([place]) 01/01/2018  . Acute respiratory failure with hypoxemia ([place]) 08/18/2017  . Chest pain 08/17/2017  . 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 Dilated cardiomyopathy ([place]) 09/10/2015  . Diabetes mellitus without complication ([place]) 50/93/2671  . HTN, goal below 140/80 09/10/2015    Orientation RESPIRATION BLADDER Height & Weight     Self, Time  Normal Continent Weight: 221 lb (100.2 kg) Height:  5\' 4"  (162.6 cm)  BEHAVIORAL SYMPTOMS/MOOD NEUROLOGICAL BOWEL NUTRITION STATUS      Continent Diet(heart healthy)  AMBULATORY STATUS COMMUNICATION OF NEEDS Skin   Supervision Verbally Normal                        Personal Care Assistance Level of Assistance  Bathing, Feeding, Dressing Bathing Assistance: Limited assistance Feeding assistance: Independent Dressing Assistance: Limited assistance     Functional Limitations Info  Sight, Hearing, Speech Sight Info: Adequate Hearing Info: Adequate Speech Info: Adequate    SPECIAL CARE FACTORS FREQUENCY        PT Frequency: x5 OT Frequency: x5            Contractures Contractures Info: Not present    Additional Factors Info  Code Status, Allergies Code Status Info: full code Allergies Info: Predisone, AXID Nizatidine, [place]           Current Medications (01/18/2018):  This is the current hospital active medication list Current Facility-Administered Medications  Medication Dose Route Frequency Provider Last Rate Last Dose  . albuterol (PROVENTIL HFA;VENTOLIN HFA) 108 (90 Base) MCG/ACT inhaler 1-2 puff  1-2 puff Inhalation Q6H PRN Merlyn Lot, MD      . amiodarone (PACERONE) tablet 200 mg  200 mg Oral Daily Merlyn Lot, MD   200 mg at 01/18/18 0755  . aspirin EC tablet 81 mg  81 mg Oral Daily Merlyn Lot, MD   81 mg at 01/18/18 0746  . atorvastatin (LIPITOR) tablet 40 mg  40 mg Oral q1800 Merlyn Lot, MD   40 mg at 01/17/18 1952  . carvedilol (COREG) tablet 12.5 mg  12.5 mg Oral BID WC Merlyn Lot, MD   12.5 mg at 01/18/18 0752  . donepezil (ARICEPT) tablet 5 mg  5 mg Oral QHS Merlyn Lot, MD   5 mg at 01/17/18 2140  .  glipiZIDE (GLUCOTROL) tablet 5 mg  5 mg Oral QAC breakfast Merlyn Lot, MD   5 mg at 01/18/18 0747  . ipratropium-albuterol (DUONEB) 0.5-2.5 (3) MG/3ML nebulizer solution 3 mL  3 mL Nebulization Q4H Merlyn Lot, MD   3 mL at 01/17/18 2142  . metFORMIN (GLUCOPHAGE) tablet 500 mg  500 mg Oral Daily Merlyn Lot, MD   500 mg at 01/18/18 0746  . mometasone-formoterol (DULERA) 200-5 MCG/ACT inhaler 2 puff  2 puff Inhalation BID Merlyn Lot, MD      . QUEtiapine  (SEROQUEL) tablet 25 mg  25 mg Oral QHS Merlyn Lot, MD   25 mg at 01/17/18 2140   Current Outpatient Medications  Medication Sig Dispense Refill  . acetaminophen (TYLENOL) 325 MG tablet Take 650 mg by mouth every 4 (four) hours as needed for mild pain or moderate pain.    Marland Kitchen albuterol (PROVENTIL HFA;VENTOLIN HFA) 108 (90 Base) MCG/ACT inhaler Inhale 1-2 puffs into the lungs every 6 (six) hours as needed for wheezing or shortness of breath. 1 Inhaler 2  . amiodarone (PACERONE) 200 MG tablet Take 1 tablet (200 mg total) by mouth daily.    Marland Kitchen aspirin EC 81 MG tablet Take 81 mg by mouth daily.    Marland Kitchen atorvastatin (LIPITOR) 40 MG tablet Take 1 tablet (40 mg total) by mouth daily at 6 PM. 30 tablet 1  . carvedilol (COREG) 12.5 MG tablet Take 12.5 mg by mouth 2 (two) times daily with a meal.    . donepezil (ARICEPT) 5 MG tablet Take 5 mg by mouth at bedtime.    . famotidine (PEPCID) 20 MG tablet Take 1 tablet (20 mg total) by mouth daily. 30 tablet 0  . glipiZIDE (GLUCOTROL) 5 MG tablet Take 5 mg by mouth daily before breakfast.    . magnesium hydroxide (MILK OF MAGNESIA) 400 MG/5ML suspension Take 30 mLs by mouth every 4 (four) hours as needed for mild constipation.    . Melatonin 3 MG TABS Take 1 tablet by mouth at bedtime.    . metFORMIN (GLUCOPHAGE) 500 MG tablet Take 500 mg by mouth daily.     . mometasone-formoterol (DULERA) 200-5 MCG/ACT AERO Inhale 2 puffs into the lungs 2 (two) times daily. 1 Inhaler 0  . nitroGLYCERIN (NITROSTAT) 0.4 MG SL tablet Place 0.4 mg under the tongue every 5 (five) minutes as needed for chest pain.    . NON FORMULARY Diet Type:  NCS, NAS    . OXYGEN Inhale 2 L/min into the lungs continuous.    Marland Kitchen QUEtiapine (SEROQUEL) 25 MG tablet Take 1 tablet (25 mg total) by mouth at bedtime. 30 tablet 0  . UNABLE TO FIND CPAP at Bedtime       Discharge Medications: Please see discharge summary for a list of discharge medications.  Relevant Imaging Results:  Relevant  Lab Results:   Additional Information [id number]  Joana Reamer, [place]

## 2018-01-18 NOTE — Progress Notes (Signed)
LCSW and TTS consulted It was explained that [facility] still had to determine support for patient. LCSW completed all fl2, assessment and sent out his information to memory care facilities. If [facility] determines he is going to a geri  psychiatric hospital TTS will send his info forward and let SSW know.  Dmitriy Gair LCSW

## 2018-01-18 NOTE — Clinical Social Work Note (Signed)
Clinical Social Work Assessment  Patient Details  Name: Lance Nunez MRN: [id number] Date of Birth: 04-21-1950  Date of referral:  01/18/18               Reason for consult:  Facility Placement                Permission sought to share information with:  Facility Sport and exercise psychologist Permission granted to share information::  Yes, Verbal Permission Granted  Name::      Ancil Linsey [phone]  Agency::     Relationship::     Contact Information:     Housing/Transportation Living arrangements for the past 2 months:  [place] of Information:  Patient, Adult Children Patient Interpreter Needed:  None Criminal Activity/Legal Involvement Pertinent to Current Situation/Hospitalization:  No - Comment as needed Significant Relationships:  Adult Children, Friend Lives with:  Facility Resident Do you feel safe going back to the place where you live?  Yes Need for family participation in patient care:  Yes (Comment)  Care giving concerns:  Patient has dementia   Social Worker assessment / plan: LCSW introduced myself to patient. It was explained he was sent to [facility] but they report that he needs a memory care unite. Patient was hard to follow but was able to answer yes no questions. He is mobile and is independent with his ADLs.  He reports he is diabetic and takes pill form medication.  Employment status:  Retired Music therapist) PT Recommendations:  [place] / Referral to community resources:  [place]  Patient/Family's Response to care:  TBD  Patient/Family's Understanding of and Emotional Response to Diagnosis, Current Treatment, and Prognosis:  TBD  Emotional Assessment Appearance:  Appears stated age Attitude/Demeanor/Rapport:  Inconsistent, Other Affect (typically observed):  Pleasant, Calm Orientation:  Oriented to Self, Oriented to Place, Fluctuating Orientation  (Suspected and/or reported Sundowners) Alcohol / Substance use:  Not Applicable Psych involvement (Current and /or in the community):  No (Comment)  Discharge Needs  Concerns to be addressed:  Discharge Planning Concerns Readmission within the last 30 days:  No Current discharge risk:  None Barriers to Discharge:  Continued Medical Work up   [place], [place], LCSW 01/18/2018, 9:12 AM

## 2018-01-19 ENCOUNTER — Other Ambulatory Visit: Payer: Self-pay

## 2018-01-19 MED ORDER — ALBUTEROL SULFATE HFA 108 (90 BASE) MCG/ACT IN AERS
2.0000 | INHALATION_SPRAY | RESPIRATORY_TRACT | Status: DC
  Administered 2018-01-20 – 2018-01-23 (×17): 2 via RESPIRATORY_TRACT
  Filled 2018-01-19: qty 6.7

## 2018-01-19 NOTE — ED Notes (Signed)
Referral information for Psychiatric Hospitalization faxed to;   [facility] [phone]),   . [facility] [phone]),  . [facility] [phone], [phone], [phone] or [phone]),   . [facility] [phone] or [phone])  . [place] [phone] or [phone]),

## 2018-01-19 NOTE — BH Assessment (Signed)
Writer received a phone call from patient's daughter Abigail Butts Simpson-[phone]) asking for an update about the patient. Wrier informed her the patient is recommended for Psych Inpatient Treatment. Informed her [facility] and [facility] were looking at the referral. Daughter asked questions about long-term placement due to [facility] recommending for him to be in a memory care unit, as well as in need of a FL-2 Form to take with her for an appointment this upcoming Wed (01/21/2018) with DSS. Writer informed her to talk with [facility] and see if they are able to provided the form due to them making the recommendation for long term treatment in a memory care unit. Daughter asked why patient wasn't been admitted with [facility] and writer explained he will be better served at a [facility].

## 2018-01-19 NOTE — BH Assessment (Signed)
Additional Information requested (CMP, [facility] & 1st EDP Note) by [facility] was faxed and confirmed it was received. Pending Review.

## 2018-01-19 NOTE — ED Notes (Signed)
BEHAVIORAL HEALTH ROUNDING Patient sleeping: No. Patient alert and oriented: yes Behavior appropriate: Yes.  ; If no, describe:  Nutrition and fluids offered: yes Toileting and hygiene offered: Yes  Sitter present: q15 minute observations and security monitoring Law enforcement present: Yes  ODS  He is currently preparing to get into the shower   EKG for placement is pending

## 2018-01-19 NOTE — ED Notes (Signed)
Hourly rounding reveals patient in room. No complaints, stable, in no acute distress. Q15 minute rounds and monitoring via Rover and Officer to continue.   

## 2018-01-19 NOTE — ED Provider Notes (Signed)
-----------------------------------------   6:15 AM on 01/19/2018 -----------------------------------------   Blood pressure 123/75, pulse 67, temperature 98.6 F (37 C), temperature source Oral, resp. rate 16, height 5\' 4"  (1.626 m), weight 100.2 kg, SpO2 92 %.  The patient had no acute events since last update.  Calm and cooperative at this time.  Disposition is pending Psychiatry/Behavioral Medicine team recommendations.     Paulette Blanch, MD 01/19/18 [phone]

## 2018-01-19 NOTE — ED Notes (Signed)

## 2018-01-19 NOTE — ED Provider Notes (Signed)
EKG interpreted by me Sinus rhythm rate of 65, normal axis intervals ST segments and T waves.  There is a left bundle branch block.  No acute ischemic changes.   Carrie Mew, MD 01/19/18 1525

## 2018-01-19 NOTE — ED Notes (Addendum)
Pt observed sitting on the side of the bed in NAD  Introduced myself to him  - plan of care discussed including his pending transfer to geripsych   Pt verbalizes  "Just let me go home - all I want is to go home"    Reassurance provided

## 2018-01-19 NOTE — ED Notes (Signed)
ED  Is the patient under IVC or is there intent for IVC: Yes.   Is the patient medically cleared: Yes.   Is there vacancy in the ED BHU:  yes   Is the population mix appropriate for patient:   geripsych placement pending  Is the patient awaiting placement in inpatient or outpatient setting: Yes.   Has the patient had a psychiatric consult: Yes.   Survey of unit performed for contraband, proper placement and condition of furniture, tampering with fixtures in bathroom, shower, and each patient room: Yes.  ; Findings:  APPEARANCE/BEHAVIOR Calm and cooperative NEURO ASSESSMENT Orientation: oriented to self and place   Denies pain Hallucinations: No.None noted (Hallucinations) Speech: Normal Gait: normal RESPIRATORY ASSESSMENT Even  Unlabored respirations  CARDIOVASCULAR ASSESSMENT Pulses equal   regular rate  Skin warm and dry   GASTROINTESTINAL ASSESSMENT no GI complaint EXTREMITIES Full ROM  PLAN OF CARE Provide calm/safe environment. Vital signs assessed twice daily. ED BHU Assessment once each 12-hour shift. Collaborate with TTS daily or as condition indicates. Assure the ED provider has rounded once each shift. Provide and encourage hygiene. Provide redirection as needed. Assess for escalating behavior; address immediately and inform ED provider.  Assess family dynamic and appropriateness for visitation as needed: Yes.  ; If necessary, describe findings:  Educate the patient/family about BHU procedures/visitation: Yes.  ; If necessary, describe findings:

## 2018-01-19 NOTE — ED Notes (Signed)
BEHAVIORAL HEALTH ROUNDING Patient sleeping: No. Patient alert and oriented: yes Behavior appropriate: Yes.  ; If no, describe:  Nutrition and fluids offered: yes Toileting and hygiene offered: Yes  Sitter present: q15 minute observations and security  monitoring Law enforcement present: Yes  ODS  

## 2018-01-19 NOTE — BH Assessment (Addendum)
Requested information faxed to [facility]. (EKG & Chest X-Ray).  Writer called to confirm it was received but staff hasn't had a chance to check their fax machine.

## 2018-01-19 NOTE — ED Notes (Signed)
Patient in shower at this time. 

## 2018-01-19 NOTE — ED Notes (Signed)
Pt into the hallway each time an overhead page is heard  - he verbalizes to tech that he has not hurt or killed anybody  Pt redirected to his room without incident  Continue to monitor

## 2018-01-19 NOTE — ED Notes (Addendum)
Received a call from his daughter   Lance Nunez  [phone] home  [phone] cell  Update provided  Plan of care discussed including pending hospitalization  - geripsych

## 2018-01-19 NOTE — ED Notes (Signed)
Patient in room talking with his nurse Amy T. at this time.

## 2018-01-19 NOTE — ED Notes (Signed)
BEHAVIORAL HEALTH ROUNDING Patient sleeping: No. Patient alert: yes   Oriented to self, place Behavior appropriate: Yes.  ; If no, describe:  Nutrition and fluids offered: yes Toileting and hygiene offered: Yes  Sitter present: q15 minute observations and security monitoring Law enforcement present: Yes  ODS

## 2018-01-19 NOTE — ED Notes (Signed)
Report to include Situation, Background, Assessment, and Recommendations received from Amy Teague RN. Patient alert, warm and dry, in no acute distress. Patient denies SI, HI, AVH and pain. Patient made aware of Q15 minute rounds and [place] and Officer presence for their safety. Patient instructed to come to me with needs or concerns.  

## 2018-01-20 LAB — COMPREHENSIVE METABOLIC PANEL
ALT: 32 U/L (ref 0–44)
AST: 35 U/L (ref 15–41)
Albumin: 2.9 g/dL — ABNORMAL LOW (ref 3.5–5.0)
Alkaline Phosphatase: 47 U/L (ref 38–126)
Anion gap: 10 (ref 5–15)
BUN: 28 mg/dL — ABNORMAL HIGH (ref 8–23)
CO2: 30 mmol/L (ref 22–32)
Calcium: 8.3 mg/dL — ABNORMAL LOW (ref 8.9–10.3)
Chloride: 103 mmol/L (ref 98–111)
Creatinine, Ser: 1.39 mg/dL — ABNORMAL HIGH (ref 0.61–1.24)
GFR calc non Af Amer: 52 mL/min — ABNORMAL LOW (ref >60)
Glucose, Bld: 107 mg/dL — ABNORMAL HIGH (ref 70–99)
Potassium: 4.3 mmol/L (ref 3.5–5.1)
SODIUM: 143 mmol/L (ref 135–145)
Total Bilirubin: 0.5 mg/dL (ref 0.3–1.2)
Total Protein: 5.9 g/dL — ABNORMAL LOW (ref 6.5–8.1)

## 2018-01-20 MED ORDER — PEDIATRIC MEDIUM MASK MISC
Status: AC
  Administered 2018-01-20: 02:00:00
  Filled 2018-01-20: qty 1

## 2018-01-20 NOTE — ED Notes (Signed)
Pt given lunch tray.

## 2018-01-20 NOTE — BH Assessment (Signed)
Writer followed up with [facility] ([place]-[phone]), they are requesting a updated CMP. Writer spoke with Dr. Weber Cooks and he ordered. Writer updated patient's nurse (Amy B.,).

## 2018-01-20 NOTE — ED Notes (Signed)
IVC/Pending Placement 

## 2018-01-20 NOTE — BH Assessment (Signed)
Writer spoke with [facility] ([phone]), patient information was received but they didn't have any available beds. Was advised to follow up tomorrow (01/21/2018) with the referral.  Writer called [facility] ([place]-[phone]), asked to refax the information.

## 2018-01-20 NOTE — ED Notes (Signed)
Hourly rounding reveals patient in room. No complaints, stable, in no acute distress. Q15 minute rounds and monitoring via Security Cameras to continue. 

## 2018-01-20 NOTE — ED Notes (Signed)
Patient resting quietly in room. No noted distress or abnormal behaviors noted. Will continue 15 minute checks and observation by security camera for safety. 

## 2018-01-20 NOTE — ED Notes (Signed)
Hourly rounding reveals patient sleeping in room. No complaints, stable, in no acute distress. Q15 minute rounds and monitoring via Security Cameras to continue. 

## 2018-01-20 NOTE — ED Notes (Signed)
Pt worried about finding something to watch on TV. Pt also believes it is the middle of the night.  Re-oriented to correct time of day.   Maintained on 15 minute checks and observation by security camera for safety.

## 2018-01-20 NOTE — ED Notes (Signed)
Report to include Situation, Background, Assessment, and Recommendations received from Amy B. RN. Patient alert and oriented, warm and dry, in no acute distress. Patient denies SI, HI, AVH and pain. Patient made aware of Q15 minute rounds and security cameras for their safety. Patient instructed to come to me with needs or concerns. 

## 2018-01-20 NOTE — BH Assessment (Signed)
Requested information faxed to [facility] ([place]-[phone]) and Probation officer called to confirmed it was received but she hasn't had a chance to get it off the fax to review.

## 2018-01-20 NOTE — ED Provider Notes (Signed)
-----------------------------------------   6:13 AM on 01/20/2018 -----------------------------------------   Blood pressure (!) 112/56, pulse 68, temperature 99.1 F (37.3 C), temperature source Oral, resp. rate 18, height 5\' 4"  (1.626 m), weight 100.2 kg, SpO2 93 %.  The patient had no acute events since last update.  Calm and cooperative at this time.  Disposition is pending Psychiatry/Behavioral Medicine team recommendations.     Paulette Blanch, MD 01/20/18 [phone]

## 2018-01-20 NOTE — ED Notes (Signed)
Pt confused as to whether it is day or night. Re-oriented by staff frequently. Maintained on 15 minute checks and observation by security camera for safety.

## 2018-01-21 NOTE — ED Notes (Signed)
Report to include Situation, Background, Assessment, and Recommendations received from Jadeka RN. Patient alert and oriented, warm and dry, in no acute distress. Patient denies SI, HI, AVH and pain. Patient made aware of Q15 minute rounds and security cameras for their safety. Patient instructed to come to me with needs or concerns. 

## 2018-01-21 NOTE — ED Notes (Signed)
Patient sitting in room, saying he is scared to move, he feels he is getting executed to night. Informed patient that he is safe here and we will not let anything happen to him. Patient said "ok"

## 2018-01-21 NOTE — ED Notes (Signed)
Hourly rounding reveals patient in room. No complaints, stable, in no acute distress. Q15 minute rounds and monitoring via Security Cameras to continue. 

## 2018-01-21 NOTE — ED Provider Notes (Signed)
Vitals:   01/20/18 1749 01/20/18 1959  BP: 138/85 126/70  Pulse: 69 96  Resp:  16  Temp:  98.5 F (36.9 C)  SpO2: 96% 98%   No events overnight, patient remains medically stable   Earleen Newport, MD 01/21/18 [phone]

## 2018-01-21 NOTE — ED Notes (Signed)
Hourly rounding reveals patient in day room. No complaints, stable, in no acute distress. Q15 minute rounds and monitoring via Security Cameras to continue. 

## 2018-01-21 NOTE — ED Notes (Signed)
Patient visiting with daughter Ancil Linsey

## 2018-01-21 NOTE — ED Notes (Signed)
BEHAVIORAL HEALTH ROUNDING Patient sleeping: No. Patient alert and oriented: yes Behavior appropriate: Yes.  ; If no, describe:  Nutrition and fluids offered: yes Toileting and hygiene offered: Yes  Sitter present: q15 minute observations and security monitoring Law enforcement present: Yes    

## 2018-01-21 NOTE — ED Notes (Signed)
Hourly rounding reveals patient sleeping in room. No complaints, stable, in no acute distress. Q15 minute rounds and monitoring via Security Cameras to continue. 

## 2018-01-21 NOTE — BH Assessment (Signed)
This Probation officer spoke with pt's daughter Abigail Butts Simpson-[phone]) who presented to ED requesting a status update on her father. She was informed that pt has been recommended for psych inpatient treatment. She was informed that pt is being reviewed by [facility], [facility], and [facility] for placement. She then mentioned having concerns about the location of the facilities the pt is being referred to, stating they are too far. Pt's daughter then asked if there was a doctor available that could sign a form deeming pt incompetent. She states she spoke with a lawyer and they recommended she have a form completed and signed by a doctor in order for pt to be deemed as incompetent. She was informed by this Probation officer that pt's treating physician makes that determination. She states she feels like she is getting the run-around because Heron Nay is telling her to get the form from the hospital.  This information was relayed to pt's nurse and Dr. Weber Cooks.

## 2018-01-21 NOTE — ED Notes (Signed)
Patient given a shower with Microbiologist assistance, patient appropriate and cooperative. NAD noted

## 2018-01-21 NOTE — ED Notes (Signed)
Sat in the dayroom and spoke with patient, he continues to say he is going to be executed tonight. Attempted to redirect patients conversation to talk about his family, patient continued to say how he felt he will be executed and he cant prevent it

## 2018-01-21 NOTE — ED Notes (Signed)
IVC/Pending Placement 

## 2018-01-21 NOTE — ED Notes (Signed)
Spoke with patients daughter Lance Nunez, and she is attempting to get guardianship over her father and not sure the appropriate steps to take, she is hesitant about involving a lawyer to have to serve her father papers  Also Mrs. Lance Nunez would like to be informed if any decisions have been made about her fathers care  Mrs. Lance Nunez is appropriate and cooperative, she has been tearful about not being able to help her father, she feels guilty about leaving him here at the hospital

## 2018-01-21 NOTE — BH Assessment (Signed)
This Probation officer called [facility] to follow-up on referral. This Probation officer was informed by [facility] - there are no male beds available at this time.  Will continue to follow-up with referrals.

## 2018-01-22 MED ORDER — LOPERAMIDE HCL 2 MG PO CAPS
4.0000 mg | ORAL_CAPSULE | Freq: Once | ORAL | Status: AC
  Administered 2018-01-22: 4 mg via ORAL
  Filled 2018-01-22: qty 2

## 2018-01-22 MED ORDER — ACETAMINOPHEN 500 MG PO TABS
1000.0000 mg | ORAL_TABLET | Freq: Once | ORAL | Status: AC
  Administered 2018-01-23: 1000 mg via ORAL
  Filled 2018-01-22: qty 2

## 2018-01-22 NOTE — ED Notes (Signed)
Patient resting quietly in room. No noted distress or abnormal behaviors noted. Will continue 15 minute checks and observation by security camera for safety. 

## 2018-01-22 NOTE — ED Notes (Signed)
Hourly rounding reveals patient sleeping in room. No complaints, stable, in no acute distress. Q15 minute rounds and monitoring via Security Cameras to continue. 

## 2018-01-22 NOTE — ED Provider Notes (Signed)
Discussed with Kerry Dory, patient accepted in transfer.  I did complete her transfer information.   Lisa Roca, MD 01/22/18 [phone]

## 2018-01-22 NOTE — ED Notes (Signed)
Hourly rounding reveals patient in room. No complaints, stable, in no acute distress. Q15 minute rounds and monitoring via Security Cameras to continue. 

## 2018-01-22 NOTE — ED Notes (Signed)
Hourly rounding reveals patient in bath room. No complaints, stable, in no acute distress. Q15 minute rounds and monitoring via [employer] to continue.

## 2018-01-22 NOTE — ED Provider Notes (Signed)
Vitals:   01/21/18 2100 01/22/18 0754  BP: 124/70 (!) 147/91  Pulse: 65 68  Resp: 16 16  Temp: 98.2 F (36.8 C)   SpO2: 96% 96%     No acute events reported to me overnight by nursing or physician report.  Patient is awaiting psychiatric disposition.   Lisa Roca, MD 01/22/18 [phone]

## 2018-01-22 NOTE — BH Assessment (Signed)
Follow up with [facility] ([place]), faxed to them updated vitals, ER MD Note and Labs (CBC).   Follow up with [facility]), was asked to refaxed the information.

## 2018-01-22 NOTE — ED Notes (Signed)
Pt's daughter Abigail Butts came to visit patient.  Pt upset after visit because he now knows he will be transferred to [facility].  "I didn't hurt anybody. I don't know what I did wrong."  Pt offered emotional support.   Maintained on 15 minute checks and observation by security camera for safety.

## 2018-01-22 NOTE — BH Assessment (Signed)
Patient has been accepted to [facility].  Patient assigned to [place] [facility] Accepting physician is Dr. Alonna Minium.  Call report to [phone].  Representative was [employer].   ER Staff is aware of it:  Holley Raring, ER Secretary  Dr. Reita Cliche, ER MD  Amy B., Patient's Nurse     Patient's Daughter Abigail Butts [phone]) have been updated as well. She stated she was going to bring clothes to the hospital to take with him.    Address [address],  [place], [place] [address]

## 2018-01-22 NOTE — ED Notes (Signed)
Pt stated his stomach was "tore up."  EDP made aware. Medication administered as ordered.

## 2018-01-22 NOTE — BH Assessment (Signed)
Writer called and spoke to [facility] [phone]) and informed them the patient will not transport to them today due to a lack of transportation. They will hold the bed till tomorrow (01/23/2018). They asked to call before the patient arrives. Writer updated ER Secretary Jonelle Sidle).

## 2018-01-22 NOTE — ED Notes (Signed)
Pt to be transferred to [facility] tomorrow.

## 2018-01-22 NOTE — ED Notes (Signed)
Pt given lunch tray.

## 2018-01-23 NOTE — ED Notes (Signed)
Pt given breakfast tray

## 2018-01-23 NOTE — ED Notes (Addendum)
RN called patient's daughter, Abigail Butts, to notify her of the patient's transfer.

## 2018-01-23 NOTE — ED Notes (Signed)
Pt discharged under IVC to [facility].  VS stable. Report given to [facility].  All belongings given to officer. Pt's daughter, Abigail Butts, notified of transfer by this RN.

## 2018-01-23 NOTE — ED Notes (Signed)
Officer here to transport patient.  Caryl Pina at [facility] called and given an update on patient (VS, medications given).

## 2018-01-23 NOTE — ED Provider Notes (Addendum)
-----------------------------------------   7:15 AM on 01/23/2018 -----------------------------------------   Blood pressure 130/73, pulse 79, temperature 98.2 F (36.8 C), temperature source Oral, resp. rate 14, height 1.626 m (5\' 4" ), weight 100.2 kg, SpO2 100 %.  The patient had no acute events since last update.  Calm and cooperative at this time.  Pending transfer to [facility] today.   Hinda Kehr, MD 01/23/18 [id number]    Hinda Kehr, MD 01/23/18 [phone]

## 2018-11-25 ENCOUNTER — Other Ambulatory Visit: Payer: Self-pay

## 2018-11-25 ENCOUNTER — Telehealth: Payer: Self-pay

## 2018-11-25 DIAGNOSIS — Z1211 Encounter for screening for malignant neoplasm of colon: Secondary | ICD-10-CM

## 2018-11-25 NOTE — Telephone Encounter (Signed)
Gastroenterology Pre-Procedure Review  Request Date: 12/10/18 Requesting Physician: Dr. Bonna Gains  PATIENT REVIEW QUESTIONS: The patient responded to the following health history questions as indicated:    1. Are you having any GI issues? no 2. Do you have a personal history of Polyps? no 3. Do you have a family history of Colon Cancer or Polyps? no 4. Diabetes Mellitus?yes 5. Joint replacements in the past 12 months?no 6. Major health problems in the past 3 months?no 7. Any artificial heart valves, MVP, or defibrillator?yes (Defibrillator Cardiac Clearance being sent to Dr. Nehemiah Massed)    [place]:    Patient reports the following regarding taking any anticoagulation/antiplatelet therapy:   Plavix, Coumadin, Eliquis, Xarelto, Lovenox, Pradaxa, Brilinta, or Effient? no Aspirin? yes (81 mg daily)  Patient confirms/reports the following medications:  Current Outpatient Medications  Medication Sig Dispense Refill  . acetaminophen (TYLENOL) 325 MG tablet Take 650 mg by mouth every 4 (four) hours as needed for mild pain or moderate pain.    Marland Kitchen albuterol (PROVENTIL HFA;VENTOLIN HFA) 108 (90 Base) MCG/ACT inhaler Inhale 1-2 puffs into the lungs every 6 (six) hours as needed for wheezing or shortness of breath. 1 Inhaler 2  . amiodarone (PACERONE) 200 MG tablet Take 1 tablet (200 mg total) by mouth daily.    Marland Kitchen aspirin EC 81 MG tablet Take 81 mg by mouth daily.    Marland Kitchen atorvastatin (LIPITOR) 40 MG tablet Take 1 tablet (40 mg total) by mouth daily at 6 PM. 30 tablet 1  . carvedilol (COREG) 12.5 MG tablet Take 12.5 mg by mouth 2 (two) times daily with a meal.    . donepezil (ARICEPT) 5 MG tablet Take 5 mg by mouth at bedtime.    . famotidine (PEPCID) 20 MG tablet Take 1 tablet (20 mg total) by mouth daily. 30 tablet 0  . glipiZIDE (GLUCOTROL) 5 MG tablet Take 5 mg by mouth daily before breakfast.    . magnesium hydroxide (MILK OF MAGNESIA) 400 MG/5ML suspension Take 30 mLs by mouth  every 4 (four) hours as needed for mild constipation.    . Melatonin 3 MG TABS Take 1 tablet by mouth at bedtime.    . metFORMIN (GLUCOPHAGE) 500 MG tablet Take 500 mg by mouth daily.     . mometasone-formoterol (DULERA) 200-5 MCG/ACT AERO Inhale 2 puffs into the lungs 2 (two) times daily. 1 Inhaler 0  . nitroGLYCERIN (NITROSTAT) 0.4 MG SL tablet Place 0.4 mg under the tongue every 5 (five) minutes as needed for chest pain.    . NON FORMULARY Diet Type:  NCS, NAS    . OXYGEN Inhale 2 L/min into the lungs continuous.    Marland Kitchen QUEtiapine (SEROQUEL) 25 MG tablet Take 1 tablet (25 mg total) by mouth at bedtime. 30 tablet 0  . UNABLE TO FIND CPAP at Bedtime     No current facility-administered medications for this visit.     Patient confirms/reports the following allergies:  Allergies  Allergen Reactions  . Prednisone Other (See Comments)    Pt states, "I just cant take it, I dont know"  . Axid [Nizatidine]   . Codeine Itching    No orders of the defined types were placed in this encounter.   AUTHORIZATION INFORMATION Primary Insurance: 1D#: Group #:  Secondary Insurance: 1D#: Group #:  SCHEDULE INFORMATION: Date: 12/10/18 Time: Location:[facility]

## 2018-12-07 ENCOUNTER — Other Ambulatory Visit: Payer: Self-pay

## 2018-12-07 ENCOUNTER — Other Ambulatory Visit
Admission: RE | Admit: 2018-12-07 | Discharge: 2018-12-07 | Disposition: A | Discharge status: Home / Self Care | Payer: Medicare Other | Source: Ambulatory Visit | Attending: Gastroenterology | Admitting: Gastroenterology

## 2018-12-07 DIAGNOSIS — Z01812 Encounter for preprocedural laboratory examination: Secondary | ICD-10-CM | POA: Insufficient documentation

## 2018-12-07 DIAGNOSIS — Z20828 Contact with and (suspected) exposure to other viral communicable diseases: Secondary | ICD-10-CM | POA: Diagnosis not present

## 2018-12-07 LAB — SARS CORONAVIRUS 2 (TAT 6-24 HRS): SARS Coronavirus 2: NEGATIVE

## 2018-12-09 ENCOUNTER — Encounter: Payer: Self-pay | Admitting: Anesthesiology

## 2018-12-10 ENCOUNTER — Encounter
Admission: RE | Disposition: A | Discharge status: Home / Self Care | Payer: Self-pay | Source: Home / Self Care | Attending: Gastroenterology

## 2018-12-10 ENCOUNTER — Ambulatory Visit: Payer: Medicare Other | Admitting: Anesthesiology

## 2018-12-10 ENCOUNTER — Encounter: Payer: Self-pay | Admitting: *Deleted

## 2018-12-10 ENCOUNTER — Ambulatory Visit
Admission: RE | Admit: 2018-12-10 | Discharge: 2018-12-10 | Disposition: A | Discharge status: Home / Self Care | Payer: Medicare Other | Attending: Gastroenterology | Admitting: Gastroenterology

## 2018-12-10 ENCOUNTER — Other Ambulatory Visit: Payer: Self-pay

## 2018-12-10 DIAGNOSIS — F0390 Unspecified dementia without behavioral disturbance: Secondary | ICD-10-CM | POA: Insufficient documentation

## 2018-12-10 DIAGNOSIS — Z7984 Long term (current) use of oral hypoglycemic drugs: Secondary | ICD-10-CM | POA: Insufficient documentation

## 2018-12-10 DIAGNOSIS — Z7982 Long term (current) use of aspirin: Secondary | ICD-10-CM | POA: Diagnosis not present

## 2018-12-10 DIAGNOSIS — Z1211 Encounter for screening for malignant neoplasm of colon: Secondary | ICD-10-CM | POA: Diagnosis not present

## 2018-12-10 DIAGNOSIS — Z95810 Presence of automatic (implantable) cardiac defibrillator: Secondary | ICD-10-CM | POA: Insufficient documentation

## 2018-12-10 DIAGNOSIS — D123 Benign neoplasm of transverse colon: Secondary | ICD-10-CM | POA: Diagnosis not present

## 2018-12-10 DIAGNOSIS — G4730 Sleep apnea, unspecified: Secondary | ICD-10-CM | POA: Diagnosis not present

## 2018-12-10 DIAGNOSIS — I110 Hypertensive heart disease with heart failure: Secondary | ICD-10-CM | POA: Insufficient documentation

## 2018-12-10 DIAGNOSIS — Z79899 Other long term (current) drug therapy: Secondary | ICD-10-CM | POA: Diagnosis not present

## 2018-12-10 DIAGNOSIS — E1129 Type 2 diabetes mellitus with other diabetic kidney complication: Secondary | ICD-10-CM | POA: Insufficient documentation

## 2018-12-10 DIAGNOSIS — K621 Rectal polyp: Secondary | ICD-10-CM

## 2018-12-10 DIAGNOSIS — J449 Chronic obstructive pulmonary disease, unspecified: Secondary | ICD-10-CM | POA: Insufficient documentation

## 2018-12-10 DIAGNOSIS — I252 Old myocardial infarction: Secondary | ICD-10-CM | POA: Diagnosis not present

## 2018-12-10 DIAGNOSIS — K5730 Diverticulosis of large intestine without perforation or abscess without bleeding: Secondary | ICD-10-CM | POA: Insufficient documentation

## 2018-12-10 DIAGNOSIS — D120 Benign neoplasm of cecum: Secondary | ICD-10-CM | POA: Diagnosis not present

## 2018-12-10 DIAGNOSIS — Z955 Presence of coronary angioplasty implant and graft: Secondary | ICD-10-CM | POA: Diagnosis not present

## 2018-12-10 DIAGNOSIS — I2510 Atherosclerotic heart disease of native coronary artery without angina pectoris: Secondary | ICD-10-CM | POA: Insufficient documentation

## 2018-12-10 DIAGNOSIS — N289 Disorder of kidney and ureter, unspecified: Secondary | ICD-10-CM | POA: Insufficient documentation

## 2018-12-10 DIAGNOSIS — K635 Polyp of colon: Secondary | ICD-10-CM | POA: Diagnosis not present

## 2018-12-10 DIAGNOSIS — Z9981 Dependence on supplemental oxygen: Secondary | ICD-10-CM | POA: Diagnosis not present

## 2018-12-10 DIAGNOSIS — I5022 Chronic systolic (congestive) heart failure: Secondary | ICD-10-CM | POA: Insufficient documentation

## 2018-12-10 DIAGNOSIS — D122 Benign neoplasm of ascending colon: Secondary | ICD-10-CM | POA: Diagnosis not present

## 2018-12-10 DIAGNOSIS — Z87891 Personal history of nicotine dependence: Secondary | ICD-10-CM | POA: Insufficient documentation

## 2018-12-10 HISTORY — PX: COLONOSCOPY WITH PROPOFOL: SHX5780

## 2018-12-10 LAB — GLUCOSE, CAPILLARY: Glucose-Capillary: 108 mg/dL — ABNORMAL HIGH (ref 70–99)

## 2018-12-10 SURGERY — COLONOSCOPY WITH PROPOFOL
Anesthesia: General

## 2018-12-10 MED ORDER — EPHEDRINE SULFATE 50 MG/ML IJ SOLN
INTRAMUSCULAR | Status: DC | PRN
  Administered 2018-12-10 (×2): 10 mg via INTRAVENOUS
  Administered 2018-12-10: 5 mg via INTRAVENOUS
  Administered 2018-12-10 (×3): 10 mg via INTRAVENOUS

## 2018-12-10 MED ORDER — SODIUM CHLORIDE 0.9 % IV SOLN
INTRAVENOUS | Status: DC
  Administered 2018-12-10: 09:00:00 via INTRAVENOUS

## 2018-12-10 MED ORDER — PROPOFOL 500 MG/50ML IV EMUL
INTRAVENOUS | Status: AC
  Filled 2018-12-10: qty 50

## 2018-12-10 MED ORDER — EPHEDRINE SULFATE 50 MG/ML IJ SOLN
INTRAMUSCULAR | Status: AC
  Filled 2018-12-10: qty 1

## 2018-12-10 MED ORDER — MIDAZOLAM HCL 2 MG/2ML IJ SOLN
INTRAMUSCULAR | Status: DC | PRN
  Administered 2018-12-10: 2 mg via INTRAVENOUS

## 2018-12-10 MED ORDER — PHENYLEPHRINE HCL (PRESSORS) 10 MG/ML IV SOLN
INTRAVENOUS | Status: AC
  Filled 2018-12-10: qty 1

## 2018-12-10 MED ORDER — MIDAZOLAM HCL 2 MG/2ML IJ SOLN
INTRAMUSCULAR | Status: AC
  Filled 2018-12-10: qty 2

## 2018-12-10 MED ORDER — PROPOFOL 500 MG/50ML IV EMUL
INTRAVENOUS | Status: DC | PRN
  Administered 2018-12-10: 100 ug/kg/min via INTRAVENOUS

## 2018-12-10 MED ORDER — FENTANYL CITRATE (PF) 100 MCG/2ML IJ SOLN
INTRAMUSCULAR | Status: AC
  Filled 2018-12-10: qty 2

## 2018-12-10 MED ORDER — FENTANYL CITRATE (PF) 100 MCG/2ML IJ SOLN
INTRAMUSCULAR | Status: DC | PRN
  Administered 2018-12-10: 50 ug via INTRAVENOUS

## 2018-12-10 MED ORDER — PHENYLEPHRINE HCL (PRESSORS) 10 MG/ML IV SOLN
INTRAVENOUS | Status: DC | PRN
  Administered 2018-12-10: 50 ug via INTRAVENOUS
  Administered 2018-12-10 (×3): 100 ug via INTRAVENOUS

## 2018-12-10 NOTE — Transfer of Care (Signed)
Immediate Anesthesia Transfer of Care Note  Patient: Lance Nunez  Procedure(s) Performed: COLONOSCOPY WITH PROPOFOL (N/A )  Patient Location: PACU  Anesthesia Type:General  Level of Consciousness: awake and sedated  Airway & Oxygen Therapy: Patient Spontanous Breathing and Patient connected to nasal cannula oxygen  Post-op Assessment: Report given to RN and Post -op Vital signs reviewed and stable  Post vital signs: Reviewed and stable  Last Vitals:  Vitals Value Taken Time  BP    Temp    Pulse    Resp    SpO2      Last Pain:  Vitals:   12/10/18 0900  TempSrc: Tympanic         Complications: No apparent anesthesia complications

## 2018-12-10 NOTE — Op Note (Signed)
[facility] Gastroenterology Patient Name: Lance Nunez Procedure Date: 12/10/2018 9:44 AM MRN: [id number] Account #: [id number] Date of Birth: 1950-07-05 Admit Type: Outpatient Age: 68 Room: [facility] ENDO ROOM 3 Gender: Male Note Status: Finalized Procedure:            Colonoscopy Indications:          Screening for colorectal malignant neoplasm Providers:            Katheryne Gorr B. Bonna Gains MD, MD Referring MD:         Edmonia Lynch. Aycock MD (Referring MD) Medicines:            Monitored Anesthesia Care Complications:        No immediate complications. Procedure:            Pre-Anesthesia Assessment:                       - ASA Grade Assessment: II - A patient with mild                        systemic disease.                       - Prior to the procedure, a History and Physical was                        performed, and patient medications, allergies and                        sensitivities were reviewed. The patient's tolerance of                        previous anesthesia was reviewed.                       - The risks and benefits of the procedure and the                        sedation options and risks were discussed with the                        patient. All questions were answered and informed                        consent was obtained.                       - Patient identification and proposed procedure were                        verified prior to the procedure by the physician, the                        nurse, the anesthesiologist, the anesthetist and the                        technician. The procedure was verified in the procedure                        room.  After obtaining informed consent, the colonoscope was                        passed under direct vision. Throughout the procedure,                        the patient's blood pressure, pulse, and oxygen                        saturations were monitored continuously. The                     Colonoscope was introduced through the anus and                        advanced to the the cecum, identified by appendiceal                        orifice and ileocecal valve. The colonoscopy was                        performed with ease. The patient tolerated the                        procedure well. The quality of the bowel preparation                        was good. Findings:      The perianal and digital rectal examinations were normal.      A 5 mm polyp was found in the cecum. The polyp was sessile. The polyp       was removed with a cold snare. Resection and retrieval were complete.      A 12 mm polyp was found in the ascending colon. The polyp was sessile.       The polyp was removed with a hot snare. Resection and retrieval were       complete.      A 5 mm polyp was found in the transverse colon. The polyp was sessile.       The polyp was removed with a cold snare. Resection and retrieval were       complete.      Two flat polyps were found in the rectum. The polyps were 3 to 4 mm in       size. These polyps were removed with a cold biopsy forceps. Resection       and retrieval were complete.      Multiple diverticula were found in the sigmoid colon.      The exam was otherwise without abnormality.      The rectum, sigmoid colon, descending colon, transverse colon, ascending       colon and cecum appeared normal.      The retroflexed view of the distal rectum and anal verge was normal and       showed no anal or rectal abnormalities. Impression:           - One 5 mm polyp in the cecum, removed with a cold                        snare. Resected and retrieved.                       -  One 12 mm polyp in the ascending colon, removed with                        a hot snare. Resected and retrieved.                       - One 5 mm polyp in the transverse colon, removed with                        a cold snare. Resected and retrieved.                       - Two 3  to 4 mm polyps in the rectum, removed with a                        cold biopsy forceps. Resected and retrieved.                       - Diverticulosis in the sigmoid colon.                       - The examination was otherwise normal.                       - The rectum, sigmoid colon, descending colon,                        transverse colon, ascending colon and cecum are normal.                       - The distal rectum and anal verge are normal on                        retroflexion view. Recommendation:       - Discharge patient to home (with escort).                       - Advance diet as tolerated.                       - Continue present medications.                       - Await pathology results.                       - Repeat colonoscopy in 3 years.                       - The findings and recommendations were discussed with                        the patient.                       - The findings and recommendations were discussed with                        the patient's family.                       - Return to primary care physician as  previously                        scheduled.                       - High fiber diet. Procedure Code(s):    --- Professional ---                       [id number], Colonoscopy, flexible; with removal of tumor(s),                        polyp(s), or other lesion(s) by snare technique                       45380, 67, Colonoscopy, flexible; with biopsy, single                        or multiple Diagnosis Code(s):    --- Professional ---                       Z12.11, Encounter for screening for malignant neoplasm                        of colon                       K63.5, Polyp of colon                       K62.1, Rectal polyp                       K57.30, Diverticulosis of large intestine without                        perforation or abscess without bleeding CPT copyright 2019 American Medical Association. All rights reserved. The codes  documented in this report are preliminary and upon coder review may  be revised to meet current compliance requirements.  Vonda Antigua, MD Margretta Sidle B. Bonna Gains MD, MD 12/10/2018 10:37:07 AM This report has been signed electronically. Number of Addenda: 0 Note Initiated On: 12/10/2018 9:44 AM Scope Withdrawal Time: 0 hours 27 minutes 12 seconds  Total Procedure Duration: 0 hours 34 minutes 38 seconds  Estimated Blood Loss: Estimated blood loss: none.      [facility]

## 2018-12-10 NOTE — Anesthesia Post-op Follow-up Note (Signed)
Anesthesia QCDR form completed.        

## 2018-12-10 NOTE — Anesthesia Postprocedure Evaluation (Signed)
Anesthesia Post Note  Patient: Lance Nunez  Procedure(s) Performed: COLONOSCOPY WITH PROPOFOL (N/A )  Patient location during evaluation: Endoscopy Anesthesia Type: General Level of consciousness: awake and alert Pain management: pain level controlled Vital Signs Assessment: post-procedure vital signs reviewed and stable Respiratory status: spontaneous breathing, nonlabored ventilation, respiratory function stable and patient connected to nasal cannula oxygen Cardiovascular status: blood pressure returned to baseline and stable Postop Assessment: no apparent nausea or vomiting Anesthetic complications: no     Last Vitals:  Vitals:   12/10/18 1142 12/10/18 1152  BP: (!) 141/75 (!) 140/91  Pulse: 65 64  Resp: 17 20  Temp:    SpO2: 96% 93%    Last Pain:  Vitals:   12/10/18 1152  TempSrc:   PainSc: 0-No pain                 Skiler Tye S

## 2018-12-10 NOTE — Anesthesia Procedure Notes (Signed)
Performed by: Cook-Martin, Garan Frappier Pre-anesthesia Checklist: Patient identified, Emergency Drugs available, Suction available, Patient being monitored and Timeout performed Patient Re-evaluated:Patient Re-evaluated prior to induction Oxygen Delivery Method: Nasal cannula Preoxygenation: Pre-oxygenation with 100% oxygen Induction Type: IV induction Placement Confirmation: CO2 detector and positive ETCO2       

## 2018-12-10 NOTE — H&P (Signed)
Vonda Antigua, MD [address] [place], [place], [place], [address] [address], [place], [place], [place], [address] Phone: [phone]  Fax: [phone]  Primary Care Physician:  Donnie Coffin, MD   Pre-Procedure History & Physical: HPI:  Lance Nunez is a 68 y.o. male is here for a colonoscopy.   Past Medical History:  Diagnosis Date  . Chronic systolic CHF (congestive heart failure) ([name]) 01/01/2018  . COPD (chronic obstructive pulmonary disease) ([place])   . Coronary artery disease   . Diabetes mellitus without complication ([place])   . DM (diabetes mellitus), type 2 with renal complications ([place]) [id number]  . Hypertension   . Myocardial infarction [facility])    95' 97' 98'    Past Surgical History:  Procedure Laterality Date  . Fractured leg Right   . IMPLANTABLE CARDIOVERTER DEFIBRILLATOR (ICD) GENERATOR CHANGE N/A 09/06/2015   Procedure: ICD GENERATOR CHANGE;  Surgeon: Isaias Cowman, MD;  Location: [facility] ORS;  Service: Cardiovascular;  Laterality: N/A;  . IMPLANTABLE CARDIOVERTER DEFIBRILLATOR (ICD) GENERATOR CHANGE Left 09/13/2015   Procedure: ICD GENERATOR CHANGE/REVISION;  Surgeon: Isaias Cowman, MD;  Location: [facility] ORS;  Service: Cardiovascular;  Laterality: Left;  . Stents N/A    4 cardiac stents    Prior to Admission medications   Medication Sig Start Date End Date Taking? Authorizing Provider  albuterol (PROVENTIL HFA;VENTOLIN HFA) 108 (90 Base) MCG/ACT inhaler Inhale 1-2 puffs into the lungs every 6 (six) hours as needed for wheezing or shortness of breath. 08/18/17  Yes Demetrios Loll, MD  amiodarone (PACERONE) 200 MG tablet Take 1 tablet (200 mg total) by mouth daily. 08/18/17  Yes Demetrios Loll, MD  aspirin EC 81 MG tablet Take 81 mg by mouth daily.   Yes [provider]  carvedilol (COREG) 12.5 MG tablet Take 12.5 mg by mouth 2 (two) times daily with a meal. 01/06/18  Yes [provider]  metFORMIN (GLUCOPHAGE) 500 MG tablet Take  500 mg by mouth daily.    Yes [provider]  QUEtiapine (SEROQUEL) 25 MG tablet Take 1 tablet (25 mg total) by mouth at bedtime. 01/06/18 12/10/18 Yes Pyreddy, Reatha Harps, MD  acetaminophen (TYLENOL) 325 MG tablet Take 650 mg by mouth every 4 (four) hours as needed for mild pain or moderate pain.    [provider]  atorvastatin (LIPITOR) 40 MG tablet Take 1 tablet (40 mg total) by mouth daily at 6 PM. 08/18/17   Demetrios Loll, MD  donepezil (ARICEPT) 5 MG tablet Take 5 mg by mouth at bedtime. 01/13/18   [provider]  famotidine (PEPCID) 20 MG tablet Take 1 tablet (20 mg total) by mouth daily. 01/06/18 02/05/18  Saundra Shelling, MD  glipiZIDE (GLUCOTROL) 5 MG tablet Take 5 mg by mouth daily before breakfast. 01/13/18   [provider]  magnesium hydroxide (MILK OF MAGNESIA) 400 MG/5ML suspension Take 30 mLs by mouth every 4 (four) hours as needed for mild constipation. 01/07/18   [provider]  Melatonin 3 MG TABS Take 1 tablet by mouth at bedtime. 01/12/18   [provider]  mometasone-formoterol (DULERA) 200-5 MCG/ACT AERO Inhale 2 puffs into the lungs 2 (two) times daily. 08/19/17   Demetrios Loll, MD  nitroGLYCERIN (NITROSTAT) 0.4 MG SL tablet Place 0.4 mg under the tongue every 5 (five) minutes as needed for chest pain.    [provider]  NON FORMULARY Diet Type:  NCS, NAS    [provider]  OXYGEN Inhale 2 L/min into the lungs  continuous.    [provider]  UNABLE TO FIND CPAP at Bedtime    [provider]    Allergies as of 11/26/2018 - Review Complete 01/19/2018  Allergen Reaction Noted  . Prednisone Other (See Comments) 03/06/2010  . Axid [nizatidine]  08/29/2015  . Codeine Itching 07/20/2014    History reviewed. No pertinent family history.  Social History   Socioeconomic History  . Marital status: Widowed    Spouse name: Not on file  . Number of children: Not on file  . Years of education: Not  on file  . Highest education level: Not on file  Occupational History  . Occupation: disabled  Social Needs  . Financial resource strain: Not on file  . Food insecurity    Worry: Not on file    Inability: Not on file  . Transportation needs    Medical: Not on file    Non-medical: Not on file  Tobacco Use  . Smoking status: Former Smoker    Quit date: 07/19/2004    Years since quitting: 14.4  . Smokeless tobacco: Never Used  Substance and Sexual Activity  . Alcohol use: No  . Drug use: No  . Sexual activity: Not on file  Lifestyle  . Physical activity    Days per week: Not on file    Minutes per session: Not on file  . Stress: Not on file  Relationships  . Social Herbalist on phone: Not on file    Gets together: Not on file    Attends religious service: Not on file    Active member of club or organization: Not on file    Attends meetings of clubs or organizations: Not on file    Relationship status: Not on file  . Intimate partner violence    Fear of current or ex partner: Not on file    Emotionally abused: Not on file    Physically abused: Not on file    Forced sexual activity: Not on file  Other Topics Concern  . Not on file  Social History Narrative  . Not on file    Review of Systems: See HPI, otherwise negative ROS  Physical Exam: BP (!) 146/97   Pulse 73   Temp (!) 96.4 F (35.8 C) (Tympanic)   Resp 18   Ht 5\' 4"  (1.626 m)   Wt 90.7 kg   SpO2 100%   BMI 34.33 kg/m  General:   Alert,  pleasant and cooperative in NAD Head:  Normocephalic and atraumatic. Neck:  Supple; no masses or thyromegaly. Lungs:  Clear throughout to auscultation, normal respiratory effort.    Heart:  +S1, +S2, Regular rate and rhythm, No edema. Abdomen:  Soft, nontender and nondistended. Normal bowel sounds, without guarding, and without rebound.   Neurologic:  Alert and  oriented x4;  grossly normal neurologically.  Impression/Plan: Lance Nunez is here for  a colonoscopy to be performed for average risk screening.  Risks, benefits, limitations, and alternatives regarding  colonoscopy have been reviewed with the patient.  Questions have been answered.  All parties agreeable.   Virgel Manifold, MD  12/10/2018, 9:35 AM

## 2018-12-10 NOTE — Anesthesia Preprocedure Evaluation (Signed)
Anesthesia Evaluation  Patient identified by MRN, date of birth, ID band Patient awake    Reviewed: Allergy & Precautions, NPO status , Patient's Chart, lab work & pertinent test results, reviewed documented beta blocker date and time   Airway Mallampati: II  TM Distance: >3 FB     Dental  (+) Chipped   Pulmonary sleep apnea , COPD, former smoker,           Cardiovascular hypertension, Pt. on medications and Pt. on home beta blockers + CAD, + Past MI and +CHF  + Cardiac Defibrillator      Neuro/Psych PSYCHIATRIC DISORDERS Dementia    GI/Hepatic   Endo/Other  diabetes, Type 2  Renal/GU Renal disease     Musculoskeletal   Abdominal   Peds  Hematology   Anesthesia Other Findings   Reproductive/Obstetrics                             Anesthesia Physical Anesthesia Plan  ASA: III  Anesthesia Plan: General   Post-op Pain Management:    Induction: Intravenous  PONV Risk Score and Plan:   Airway Management Planned:   Additional Equipment:   Intra-op Plan:   Post-operative Plan:   Informed Consent: I have reviewed the patients History and Physical, chart, labs and discussed the procedure including the risks, benefits and alternatives for the proposed anesthesia with the patient or authorized representative who has indicated his/her understanding and acceptance.       Plan Discussed with: CRNA  Anesthesia Plan Comments:         Anesthesia Quick Evaluation

## 2018-12-11 LAB — SURGICAL PATHOLOGY

## 2018-12-14 ENCOUNTER — Encounter: Payer: Self-pay | Admitting: Gastroenterology

## 2019-02-17 ENCOUNTER — Inpatient Hospital Stay
Admission: EM | Admit: 2019-02-17 | Discharge: 2019-03-22 | DRG: 177 | Disposition: E | Payer: Medicare Other | Attending: Family Medicine | Admitting: Family Medicine

## 2019-02-17 ENCOUNTER — Other Ambulatory Visit: Payer: Self-pay

## 2019-02-17 ENCOUNTER — Emergency Department: Payer: Medicare Other

## 2019-02-17 ENCOUNTER — Encounter: Payer: Self-pay | Admitting: *Deleted

## 2019-02-17 DIAGNOSIS — Z7982 Long term (current) use of aspirin: Secondary | ICD-10-CM

## 2019-02-17 DIAGNOSIS — Z955 Presence of coronary angioplasty implant and graft: Secondary | ICD-10-CM

## 2019-02-17 DIAGNOSIS — I130 Hypertensive heart and chronic kidney disease with heart failure and stage 1 through stage 4 chronic kidney disease, or unspecified chronic kidney disease: Secondary | ICD-10-CM | POA: Diagnosis present

## 2019-02-17 DIAGNOSIS — F0390 Unspecified dementia without behavioral disturbance: Secondary | ICD-10-CM

## 2019-02-17 DIAGNOSIS — Z515 Encounter for palliative care: Secondary | ICD-10-CM | POA: Diagnosis not present

## 2019-02-17 DIAGNOSIS — E875 Hyperkalemia: Secondary | ICD-10-CM | POA: Diagnosis present

## 2019-02-17 DIAGNOSIS — J440 Chronic obstructive pulmonary disease with acute lower respiratory infection: Secondary | ICD-10-CM | POA: Diagnosis present

## 2019-02-17 DIAGNOSIS — I248 Other forms of acute ischemic heart disease: Secondary | ICD-10-CM | POA: Diagnosis present

## 2019-02-17 DIAGNOSIS — E785 Hyperlipidemia, unspecified: Secondary | ICD-10-CM | POA: Diagnosis present

## 2019-02-17 DIAGNOSIS — U071 COVID-19: Secondary | ICD-10-CM | POA: Diagnosis not present

## 2019-02-17 DIAGNOSIS — N1832 Chronic kidney disease, stage 3b: Secondary | ICD-10-CM | POA: Diagnosis present

## 2019-02-17 DIAGNOSIS — Z7951 Long term (current) use of inhaled steroids: Secondary | ICD-10-CM

## 2019-02-17 DIAGNOSIS — E119 Type 2 diabetes mellitus without complications: Secondary | ICD-10-CM

## 2019-02-17 DIAGNOSIS — I5022 Chronic systolic (congestive) heart failure: Secondary | ICD-10-CM | POA: Diagnosis present

## 2019-02-17 DIAGNOSIS — R7989 Other specified abnormal findings of blood chemistry: Secondary | ICD-10-CM

## 2019-02-17 DIAGNOSIS — J9621 Acute and chronic respiratory failure with hypoxia: Secondary | ICD-10-CM | POA: Diagnosis present

## 2019-02-17 DIAGNOSIS — R079 Chest pain, unspecified: Secondary | ICD-10-CM

## 2019-02-17 DIAGNOSIS — J9622 Acute and chronic respiratory failure with hypercapnia: Secondary | ICD-10-CM | POA: Diagnosis present

## 2019-02-17 DIAGNOSIS — Z66 Do not resuscitate: Secondary | ICD-10-CM | POA: Diagnosis not present

## 2019-02-17 DIAGNOSIS — G9349 Other encephalopathy: Secondary | ICD-10-CM | POA: Diagnosis present

## 2019-02-17 DIAGNOSIS — Z7984 Long term (current) use of oral hypoglycemic drugs: Secondary | ICD-10-CM

## 2019-02-17 DIAGNOSIS — Z4502 Encounter for adjustment and management of automatic implantable cardiac defibrillator: Secondary | ICD-10-CM

## 2019-02-17 DIAGNOSIS — E860 Dehydration: Secondary | ICD-10-CM | POA: Diagnosis present

## 2019-02-17 DIAGNOSIS — Z79899 Other long term (current) drug therapy: Secondary | ICD-10-CM

## 2019-02-17 DIAGNOSIS — I420 Dilated cardiomyopathy: Secondary | ICD-10-CM | POA: Diagnosis present

## 2019-02-17 DIAGNOSIS — E8770 Fluid overload, unspecified: Secondary | ICD-10-CM | POA: Diagnosis present

## 2019-02-17 DIAGNOSIS — G9341 Metabolic encephalopathy: Secondary | ICD-10-CM | POA: Diagnosis present

## 2019-02-17 DIAGNOSIS — R05 Cough: Secondary | ICD-10-CM

## 2019-02-17 DIAGNOSIS — E663 Overweight: Secondary | ICD-10-CM | POA: Diagnosis present

## 2019-02-17 DIAGNOSIS — N179 Acute kidney failure, unspecified: Secondary | ICD-10-CM

## 2019-02-17 DIAGNOSIS — Z6834 Body mass index (BMI) 34.0-34.9, adult: Secondary | ICD-10-CM

## 2019-02-17 DIAGNOSIS — R778 Other specified abnormalities of plasma proteins: Secondary | ICD-10-CM | POA: Diagnosis not present

## 2019-02-17 DIAGNOSIS — I2510 Atherosclerotic heart disease of native coronary artery without angina pectoris: Secondary | ICD-10-CM | POA: Diagnosis present

## 2019-02-17 DIAGNOSIS — E870 Hyperosmolality and hypernatremia: Secondary | ICD-10-CM | POA: Diagnosis present

## 2019-02-17 DIAGNOSIS — J1282 Pneumonia due to coronavirus disease 2019: Secondary | ICD-10-CM | POA: Diagnosis present

## 2019-02-17 DIAGNOSIS — I252 Old myocardial infarction: Secondary | ICD-10-CM

## 2019-02-17 DIAGNOSIS — Z87891 Personal history of nicotine dependence: Secondary | ICD-10-CM

## 2019-02-17 DIAGNOSIS — E1122 Type 2 diabetes mellitus with diabetic chronic kidney disease: Secondary | ICD-10-CM | POA: Diagnosis present

## 2019-02-17 DIAGNOSIS — R059 Cough, unspecified: Secondary | ICD-10-CM

## 2019-02-17 DIAGNOSIS — D509 Iron deficiency anemia, unspecified: Secondary | ICD-10-CM | POA: Diagnosis present

## 2019-02-17 DIAGNOSIS — D649 Anemia, unspecified: Secondary | ICD-10-CM

## 2019-02-17 DIAGNOSIS — Z95810 Presence of automatic (implantable) cardiac defibrillator: Secondary | ICD-10-CM

## 2019-02-17 LAB — CBC
HCT: 32.4 % — ABNORMAL LOW (ref 39.0–52.0)
HCT: 29.6 % — ABNORMAL LOW (ref 39.0–52.0)
Hemoglobin: 10 g/dL — ABNORMAL LOW (ref 13.0–17.0)
Hemoglobin: 9.4 g/dL — ABNORMAL LOW (ref 13.0–17.0)
MCH: 22.1 pg — ABNORMAL LOW (ref 26.0–34.0)
MCH: 22.7 pg — ABNORMAL LOW (ref 26.0–34.0)
MCHC: 30.9 g/dL (ref 30.0–36.0)
MCHC: 31.8 g/dL (ref 30.0–36.0)
MCV: 71.7 fL — ABNORMAL LOW (ref 80.0–100.0)
MCV: 71.3 fL — ABNORMAL LOW (ref 80.0–100.0)
Platelets: 165 10*3/uL (ref 150–400)
Platelets: 141 10*3/uL — ABNORMAL LOW (ref 150–400)
RBC: 4.52 MIL/uL (ref 4.22–5.81)
RBC: 4.15 MIL/uL — ABNORMAL LOW (ref 4.22–5.81)
RDW: 21.2 % — ABNORMAL HIGH (ref 11.5–15.5)
RDW: 21.2 % — ABNORMAL HIGH (ref 11.5–15.5)
WBC: 4.2 10*3/uL (ref 4.0–10.5)
WBC: 4 10*3/uL (ref 4.0–10.5)
nRBC: 0 % (ref 0.0–0.2)
nRBC: 0 % (ref 0.0–0.2)

## 2019-02-17 LAB — RESPIRATORY PANEL BY RT PCR (FLU A&B, COVID)
Influenza A by PCR: NEGATIVE
Influenza B by PCR: NEGATIVE
SARS Coronavirus 2 by RT PCR: POSITIVE — AB

## 2019-02-17 LAB — BASIC METABOLIC PANEL
Anion gap: 11 (ref 5–15)
Anion gap: 7 (ref 5–15)
BUN: 29 mg/dL — ABNORMAL HIGH (ref 8–23)
BUN: 29 mg/dL — ABNORMAL HIGH (ref 8–23)
CO2: 26 mmol/L (ref 22–32)
CO2: 28 mmol/L (ref 22–32)
Calcium: 8.2 mg/dL — ABNORMAL LOW (ref 8.9–10.3)
Calcium: 7.9 mg/dL — ABNORMAL LOW (ref 8.9–10.3)
Chloride: 100 mmol/L (ref 98–111)
Chloride: 101 mmol/L (ref 98–111)
Creatinine, Ser: 1.67 mg/dL — ABNORMAL HIGH (ref 0.61–1.24)
Creatinine, Ser: 1.6 mg/dL — ABNORMAL HIGH (ref 0.61–1.24)
GFR calc Af Amer: 48 mL/min — ABNORMAL LOW (ref >60)
GFR calc Af Amer: 51 mL/min — ABNORMAL LOW (ref >60)
GFR calc non Af Amer: 41 mL/min — ABNORMAL LOW (ref >60)
GFR calc non Af Amer: 44 mL/min — ABNORMAL LOW (ref >60)
Glucose, Bld: 204 mg/dL — ABNORMAL HIGH (ref 70–99)
Glucose, Bld: 196 mg/dL — ABNORMAL HIGH (ref 70–99)
Potassium: 4.2 mmol/L (ref 3.5–5.1)
Potassium: 4.3 mmol/L (ref 3.5–5.1)
Sodium: 137 mmol/L (ref 135–145)
Sodium: 136 mmol/L (ref 135–145)

## 2019-02-17 LAB — TROPONIN I (HIGH SENSITIVITY)
Troponin I (High Sensitivity): 31 ng/L — ABNORMAL HIGH (ref <18)
Troponin I (High Sensitivity): 27 ng/L — ABNORMAL HIGH (ref <18)

## 2019-02-17 LAB — MAGNESIUM: Magnesium: 2.2 mg/dL (ref 1.7–2.4)

## 2019-02-17 LAB — HIV ANTIBODY (ROUTINE TESTING W REFLEX): HIV Screen 4th Generation wRfx: NONREACTIVE

## 2019-02-17 MED ORDER — MELATONIN 5 MG PO TABS
2.5000 mg | ORAL_TABLET | Freq: Every day | ORAL | Status: DC
  Administered 2019-02-17 – 2019-02-22 (×6): 2.5 mg via ORAL
  Filled 2019-02-17 (×8): qty 0.5

## 2019-02-17 MED ORDER — ATORVASTATIN CALCIUM 20 MG PO TABS
40.0000 mg | ORAL_TABLET | Freq: Every day | ORAL | Status: DC
  Administered 2019-02-17 – 2019-02-22 (×6): 40 mg via ORAL
  Filled 2019-02-17 (×6): qty 2

## 2019-02-17 MED ORDER — ENOXAPARIN SODIUM 40 MG/0.4ML ~~LOC~~ SOLN
40.0000 mg | SUBCUTANEOUS | Status: DC
  Administered 2019-02-17 – 2019-02-23 (×7): 40 mg via SUBCUTANEOUS
  Filled 2019-02-17 (×7): qty 0.4

## 2019-02-17 MED ORDER — CARVEDILOL 12.5 MG PO TABS
12.5000 mg | ORAL_TABLET | Freq: Two times a day (BID) | ORAL | Status: DC
  Administered 2019-02-17 – 2019-02-22 (×12): 12.5 mg via ORAL
  Filled 2019-02-17 (×9): qty 1
  Filled 2019-02-17: qty 2
  Filled 2019-02-17 (×3): qty 1

## 2019-02-17 MED ORDER — ASPIRIN EC 81 MG PO TBEC
81.0000 mg | DELAYED_RELEASE_TABLET | Freq: Every day | ORAL | Status: DC
  Administered 2019-02-17 – 2019-02-22 (×6): 81 mg via ORAL
  Filled 2019-02-17 (×7): qty 1

## 2019-02-17 MED ORDER — SODIUM CHLORIDE 0.9% FLUSH: 3.0000 mL | Freq: Once | INTRAVENOUS | Status: DC

## 2019-02-17 MED ORDER — DONEPEZIL HCL 5 MG PO TABS
5.0000 mg | ORAL_TABLET | Freq: Every day | ORAL | Status: DC
  Administered 2019-02-17 – 2019-02-22 (×6): 5 mg via ORAL
  Filled 2019-02-17 (×7): qty 1

## 2019-02-17 MED ORDER — QUETIAPINE FUMARATE 25 MG PO TABS
100.0000 mg | ORAL_TABLET | Freq: Every day | ORAL | Status: DC
  Administered 2019-02-17 – 2019-02-22 (×6): 100 mg via ORAL
  Filled 2019-02-17 (×6): qty 4

## 2019-02-17 MED ORDER — SERTRALINE HCL 50 MG PO TABS
50.0000 mg | ORAL_TABLET | Freq: Every day | ORAL | Status: DC
  Administered 2019-02-17 – 2019-02-22 (×6): 50 mg via ORAL
  Filled 2019-02-17 (×7): qty 1

## 2019-02-17 MED ORDER — MOMETASONE FURO-FORMOTEROL FUM 200-5 MCG/ACT IN AERO
2.0000 | INHALATION_SPRAY | Freq: Two times a day (BID) | RESPIRATORY_TRACT | Status: DC
  Administered 2019-02-17 – 2019-02-22 (×11): 2 via RESPIRATORY_TRACT
  Filled 2019-02-17: qty 8.8

## 2019-02-17 NOTE — ED Notes (Signed)
Pt son in law called back, updated him on patient status. Please call with any questions or further updates.

## 2019-02-17 NOTE — ED Notes (Signed)
Left message for patient son in law about pt status, please notify him with updates.

## 2019-02-17 NOTE — Progress Notes (Signed)
PROGRESS NOTE    Lance Nunez  [id number] DOB: 1950-08-10 DOA: 02/15/2019 PCP: Donnie Coffin, MD      Brief Narrative:  Lance Nunez is a 68 y.o. M with sCHF with AICD, CAD, CKD IIIa baseline 1.5, HTN, and dementia who presented with AICD discharge.  In the ER he was afebrile, HR and BP normal. Respirations 22 and 90% on room air.  CXR showed no disease.      Assessment & Plan:  Chest discomfort Patient presented with chief complaint of "my ICD fired".  Unfortunately interrogation of his ICD showed that it had not in fact fired.  Cardiology were consulted, who recommended no further work-up, continuing his home cardiovascular meds.   Acute metabolic encephalopathy versus dementia When I entered the room, the patient is unable to state his name, where he is, why he is here, and mostly is unable to answer any questions intelligibly.  I discussed with family, and at baseline, he is oriented to self, and conversational, but not able to tell where he is or what year it is at baseline.  In addition he is able to ambulate short distances without assistance, but has not been able to live on his own for several years, and has fairly advanced dementia.  In that context, I am unable to understand if he has an acute metabolic encephalopathy from Covid, or if this is his baseline and he is just sleepy from being awake in the ER all night.   -We will observe and get physical therapy.    Coronavirus infection Patient was tested yesterday due to exposure to his grandsons who tested positive.  He has not had symptoms, and he is on his home oxygen, and has got no opacities on chest x-ray.   -Monitor pulse ox twice daily -We will look into bamlanivimab  COPD with chronic hypoxic respiratory failure on 2L O2 at baseline No active disease -Continue Advair  Ischemic heart disease -Continue aspirin, atorvastatin, carvedilol  Dementia -Continue donepezil, quetiapine -Continue  melatonin  Diabetes -Hold glipizide, metformin -Start SS insulin corrections  CKD IIIb Creatinine at baseline 1.6      DVT prophylaxis: Lovenox Code Status: Full code Family Communication: Daughter by phone MDM and disposition Plan: This is a no charge note.  For further details, please see H&P by my partner Dr. Damita Dunnings from earlier today.  The below labs and imaging reports were reviewed and summarized above.    The patient was admitted with confusion.  If his mental status remains stable, he is able to participate in self-cares, and ambulate with physical therapy, and he has no oxygen desaturations home tomorrow.          Objective: Vitals:   02/04/2019 1230 01/22/2019 1330 02/16/2019 1400 02/16/2019 1447  BP: 122/66 (!) 122/58 139/80 (!) 142/67  Pulse: 62 66 (!) 134 71  Resp: (!) 27 (!) 33 (!) 22 18  Temp:    98.2 F (36.8 C)  TempSrc:    Oral  SpO2: 91% 92% 97% 99%  Weight:    95.6 kg  Height:    5\' 6"  (1.676 m)   No intake or output data in the 24 hours ending 02/04/2019 1600 Filed Weights   02/03/2019 0400 02/07/2019 1447  Weight: 93.4 kg 95.6 kg    Examination: The patient was seen and examined.      Data Reviewed: I have personally reviewed following labs and imaging studies:  CBC: Recent Labs  Lab 01/20/2019 0243 02/02/2019 0410  WBC 4.2 4.0  HGB 10.0* 9.4*  HCT 32.4* 29.6*  MCV 71.7* 71.3*  PLT 165 [id number]*   Basic Metabolic Panel: Recent Labs  Lab 01/28/2019 0243 02/05/2019 0410 01/19/2019 0411  NA 137  --  136  K 4.2  --  4.3  CL 100  --  101  CO2 26  --  28  GLUCOSE 204*  --  196*  BUN 29*  --  29*  CREATININE 1.67*  --  1.60*  CALCIUM 8.2*  --  7.9*  MG  --  2.2  --    GFR: Estimated Creatinine Clearance: 47.8 mL/min (A) (by C-G formula based on SCr of 1.6 mg/dL (H)). Liver Function Tests: No results for input(s): AST, ALT, ALKPHOS, BILITOT, PROT, ALBUMIN in the last 168 hours. No results for input(s): LIPASE, AMYLASE in the last 168 hours. No  results for input(s): AMMONIA in the last 168 hours. Coagulation Profile: No results for input(s): INR, PROTIME in the last 168 hours. Cardiac Enzymes: No results for input(s): CKTOTAL, CKMB, CKMBINDEX, TROPONINI in the last 168 hours. BNP (last 3 results) No results for input(s): PROBNP in the last 8760 hours. HbA1C: No results for input(s): HGBA1C in the last 72 hours. CBG: No results for input(s): GLUCAP in the last 168 hours. Lipid Profile: No results for input(s): CHOL, HDL, LDLCALC, TRIG, CHOLHDL, LDLDIRECT in the last 72 hours. Thyroid Function Tests: No results for input(s): TSH, T4TOTAL, FREET4, T3FREE, THYROIDAB in the last 72 hours. Anemia Panel: No results for input(s): VITAMINB12, FOLATE, FERRITIN, TIBC, IRON, RETICCTPCT in the last 72 hours. Urine analysis:    Component Value Date/Time   COLORURINE YELLOW (A) 01/17/2018 1558   APPEARANCEUR CLEAR (A) 01/17/2018 1558   LABSPEC 1.018 01/17/2018 1558   PHURINE 5.0 01/17/2018 1558   GLUCOSEU NEGATIVE 01/17/2018 1558   HGBUR SMALL (A) 01/17/2018 1558   BILIRUBINUR NEGATIVE 01/17/2018 1558   KETONESUR NEGATIVE 01/17/2018 1558   PROTEINUR 100 (A) 01/17/2018 1558   NITRITE NEGATIVE 01/17/2018 1558   LEUKOCYTESUR NEGATIVE 01/17/2018 1558   Sepsis Labs: @LABRCNTIP (procalcitonin:4,lacticacidven:4)  ) Recent Results (from the past 240 hour(s))  Respiratory Panel by RT PCR (Flu A&B, Covid) - Nasopharyngeal Swab     Status: Abnormal   Collection Time: 02/05/2019  3:37 AM   Specimen: Nasopharyngeal Swab  Result Value Ref Range Status   SARS Coronavirus 2 by RT PCR POSITIVE (A) NEGATIVE Final    Comment: RESULT CALLED TO, READ BACK BY AND VERIFIED WITH: Dorian Furnace RN [phone] 01/24/2019 HNM (NOTE) SARS-CoV-2 target nucleic acids are DETECTED. SARS-CoV-2 RNA is generally detectable in upper respiratory specimens  during the acute phase of infection. Positive results are indicative of the presence of the identified virus, but  do not rule out bacterial infection or co-infection with other pathogens not detected by the test. Clinical correlation with patient history and other diagnostic information is necessary to determine patient infection status. The expected result is Negative. Fact Sheet for Patients:  PinkCheek.be Fact Sheet for Healthcare Providers: GravelBags.it This test is not yet approved or cleared by the [place] FDA and  has been authorized for detection and/or diagnosis of SARS-CoV-2 by FDA under an Emergency Use Authorization (EUA).  This EUA will remain in effect (meaning this test can be used)  for the duration of  the COVID-19 declaration under Section 564(b)(1) of the Act, 21 U.S.C. section 360bbb-3(b)(1), unless the authorization is terminated or revoked sooner.    Influenza A by PCR NEGATIVE NEGATIVE Final  Influenza B by PCR NEGATIVE NEGATIVE Final    Comment: (NOTE) The Xpert Xpress SARS-CoV-2/FLU/RSV assay is intended as an aid in  the diagnosis of influenza from Nasopharyngeal swab specimens and  should not be used as a sole basis for treatment. Nasal washings and  aspirates are unacceptable for Xpert Xpress SARS-CoV-2/FLU/RSV  testing. Fact Sheet for Patients: PinkCheek.be Fact Sheet for Healthcare Providers: GravelBags.it This test is not yet approved or cleared by the [place] FDA and  has been authorized for detection and/or diagnosis of SARS-CoV-2 by  FDA under an Emergency Use Authorization (EUA). This EUA will remain  in effect (meaning this test can be used) for the duration of the  Covid-19 declaration under Section 564(b)(1) of the Act, 21  U.S.C. section 360bbb-3(b)(1), unless the authorization is  terminated or revoked. Performed at [facility], [address] [place], [place] [address]          Radiology Studies: DG Chest  [place] 1 View  Result Date: 02/04/2019 CLINICAL DATA:  Defibrillator went off EXAM: PORTABLE CHEST 1 VIEW COMPARISON:  01/01/2018, 01/17/2018 FINDINGS: Left-sided pacing device as before. Mild atelectasis at the bases. No consolidation or effusion. Stable cardiomediastinal silhouette. No pneumothorax. IMPRESSION: No active disease.  Mildly diminished lung volumes. Electronically Signed   By: Donavan Foil M.D.   On: 02/16/2019 03:08        Scheduled Meds: . aspirin EC  81 mg Oral Daily  . atorvastatin  40 mg Oral q1800  . carvedilol  12.5 mg Oral BID WC  . donepezil  5 mg Oral QHS  . enoxaparin (LOVENOX) injection  40 mg Subcutaneous Q24H  . Melatonin  2.5 mg Oral QHS  . mometasone-formoterol  2 puff Inhalation BID  . QUEtiapine  100 mg Oral QHS  . sertraline  50 mg Oral Daily   Continuous Infusions:   LOS: 0 days    Time spent: 15 minutes    Edwin Dada, MD [facility] 02/12/2019, 4:00 PM     Please page though [place] or Epic secure chat:  For password, contact charge nurse

## 2019-02-17 NOTE — H&P (Signed)
History and Physical    Lance Nunez [id number] DOB: September 16, 1950 DOA: 02/18/2019  PCP: Donnie Coffin, MD  Patient coming from: home  I have personally briefly reviewed patient's old medical records in [place]  Chief Complaint: defibrillator went off  HPI: Lance Nunez is a 68 y.o. male with medical history significant for systolic heart failure status post AICD, CAD, CKD 3 hypertension, dementia, COPD, recently diagnosed with COVID-19 from the health department, who presents to the emergency room with ported AICD discharge.  He had no nausea vomiting or diaphoresis.  Had no chest pain upon arrival in the emergency room  ED Course: Rival in the emergency room, he appeared comfortable.  98.7 blood pressure 121/64, heart rate 71, respirations 22 with O2 sat of 90% on room air.  Initial troponin was 31.  Troponin at baseline needs and pending at time of request for admission.  KG showed no acute ST-T wave changes.  S x-ray showed no active disease.  The emergency room provider spoke with cardiologist Dr. Nehemiah Massed who stated that if AICD was a fire again he recommended deactivating with a magnet and starting an amiodarone infusion.  Of note, nursing documentation reveals that [employer] had no events since 01/12/2019 Review of Systems: unable to provide due to dementia  Past Medical History:  Diagnosis Date  . Chronic systolic CHF (congestive heart failure) ([place]) 01/01/2018  . COPD (chronic obstructive pulmonary disease) ([place])   . Coronary artery disease   . Diabetes mellitus without complication ([place])   . DM (diabetes mellitus), type 2 with renal complications ([place]) [id number]  . Hypertension   . Myocardial infarction [facility])    95' 97' 98'    Past Surgical History:  Procedure Laterality Date  . COLONOSCOPY WITH PROPOFOL N/A 12/10/2018   Procedure: COLONOSCOPY WITH PROPOFOL;  Surgeon: Virgel Manifold, MD;  Location: [facility] ENDOSCOPY;  Service: Endoscopy;   Laterality: N/A;  . Fractured leg Right   . IMPLANTABLE CARDIOVERTER DEFIBRILLATOR (ICD) GENERATOR CHANGE N/A 09/06/2015   Procedure: ICD GENERATOR CHANGE;  Surgeon: Isaias Cowman, MD;  Location: [facility] ORS;  Service: Cardiovascular;  Laterality: N/A;  . IMPLANTABLE CARDIOVERTER DEFIBRILLATOR (ICD) GENERATOR CHANGE Left 09/13/2015   Procedure: ICD GENERATOR CHANGE/REVISION;  Surgeon: Isaias Cowman, MD;  Location: [facility] ORS;  Service: Cardiovascular;  Laterality: Left;  . Stents N/A    4 cardiac stents     reports that he quit smoking about 14 years ago. He has never used smokeless tobacco. He reports that he does not drink alcohol or use drugs.  Allergies  Allergen Reactions  . Prednisone Other (See Comments)    Pt states, "I just cant take it, I dont know"  . Axid [Nizatidine]   . Codeine Itching    No family history on file.   Prior to Admission medications   Medication Sig Start Date End Date Taking? Authorizing Provider  acetaminophen (TYLENOL) 325 MG tablet Take 650 mg by mouth every 4 (four) hours as needed for mild pain or moderate pain.    [provider]  albuterol (PROVENTIL HFA;VENTOLIN HFA) 108 (90 Base) MCG/ACT inhaler Inhale 1-2 puffs into the lungs every 6 (six) hours as needed for wheezing or shortness of breath. 08/18/17   Demetrios Loll, MD  amiodarone (PACERONE) 200 MG tablet Take 1 tablet (200 mg total) by mouth daily. 08/18/17   Demetrios Loll, MD  aspirin EC 81 MG tablet Take 81 mg by mouth daily.    [provider]  atorvastatin (  LIPITOR) 40 MG tablet Take 1 tablet (40 mg total) by mouth daily at 6 PM. 08/18/17   Demetrios Loll, MD  carvedilol (COREG) 12.5 MG tablet Take 12.5 mg by mouth 2 (two) times daily with a meal. 01/06/18   [provider]  donepezil (ARICEPT) 5 MG tablet Take 5 mg by mouth at bedtime. 01/13/18   [provider]  famotidine (PEPCID) 20 MG tablet Take 1 tablet (20 mg total) by mouth daily. 01/06/18 02/05/18   Saundra Shelling, MD  glipiZIDE (GLUCOTROL) 5 MG tablet Take 5 mg by mouth daily before breakfast. 01/13/18   [provider]  magnesium hydroxide (MILK OF MAGNESIA) 400 MG/5ML suspension Take 30 mLs by mouth every 4 (four) hours as needed for mild constipation. 01/07/18   [provider]  Melatonin 3 MG TABS Take 1 tablet by mouth at bedtime. 01/12/18   [provider]  metFORMIN (GLUCOPHAGE) 500 MG tablet Take 500 mg by mouth daily.     [provider]  mometasone-formoterol (DULERA) 200-5 MCG/ACT AERO Inhale 2 puffs into the lungs 2 (two) times daily. 08/19/17   Demetrios Loll, MD  nitroGLYCERIN (NITROSTAT) 0.4 MG SL tablet Place 0.4 mg under the tongue every 5 (five) minutes as needed for chest pain.    [provider]  NON FORMULARY Diet Type:  NCS, NAS    [provider]  OXYGEN Inhale 2 L/min into the lungs continuous.    [provider]  QUEtiapine (SEROQUEL) 25 MG tablet Take 1 tablet (25 mg total) by mouth at bedtime. 01/06/18 12/10/18  Saundra Shelling, MD  UNABLE TO FIND CPAP at Bedtime    [provider]    Physical Exam: Vitals:   02/16/2019 0231 01/28/2019 0338 02/13/2019 0400  BP: 121/64 132/74   Pulse: 71 68   Resp: (!) 22 (!) 21   Temp: 98.7 F (37.1 C)    TempSrc: Oral    SpO2: 90% 97%   Weight:   93.4 kg     Vitals:   01/25/2019 0231 01/29/2019 0338 01/24/2019 0400  BP: 121/64 132/74   Pulse: 71 68   Resp: (!) 22 (!) 21   Temp: 98.7 F (37.1 C)    TempSrc: Oral    SpO2: 90% 97%   Weight:   93.4 kg    Constitutional: NAD, oriented to person only Eyes: PERRL, lids and conjunctivae normal ENMT: Mucous membranes are moist.  Neck: normal, supple, no masses, no thyromegaly Respiratory: clear to auscultation bilaterally, no wheezing, no crackles. Normal respiratory effort. No accessory muscle use.  Cardiovascular: Regular rate and rhythm, no murmurs / rubs / gallops. No extremity edema. 2+ pedal pulses. No  carotid bruits.  Abdomen: no tenderness, no masses palpated. No hepatosplenomegaly. Bowel sounds positive.  Musculoskeletal: no clubbing / cyanosis. No joint deformity upper and lower extremities.  Skin: no rashes, lesions, ulcers.  Neurologic: No gross focal neurologic deficit. Psychiatric: Normal mood and affect.   Labs on Admission: I have personally reviewed following labs and imaging studies  CBC: Recent Labs  Lab 01/26/2019 0243  WBC 4.2  HGB 10.0*  HCT 32.4*  MCV 71.7*  PLT [id number]   Basic Metabolic Panel: Recent Labs  Lab 02/15/2019 0243  NA 137  K 4.2  CL 100  CO2 26  GLUCOSE 204*  BUN 29*  CREATININE 1.67*  CALCIUM 8.2*   GFR: Estimated Creatinine Clearance: 43.7 mL/min (A) (by C-G formula based on SCr of 1.67 mg/dL (H)). Liver Function Tests: No  results for input(s): AST, ALT, ALKPHOS, BILITOT, PROT, ALBUMIN in the last 168 hours. No results for input(s): LIPASE, AMYLASE in the last 168 hours. No results for input(s): AMMONIA in the last 168 hours. Coagulation Profile: No results for input(s): INR, PROTIME in the last 168 hours. Cardiac Enzymes: No results for input(s): CKTOTAL, CKMB, CKMBINDEX, TROPONINI in the last 168 hours. BNP (last 3 results) No results for input(s): PROBNP in the last 8760 hours. HbA1C: No results for input(s): HGBA1C in the last 72 hours. CBG: No results for input(s): GLUCAP in the last 168 hours. Lipid Profile: No results for input(s): CHOL, HDL, LDLCALC, TRIG, CHOLHDL, LDLDIRECT in the last 72 hours. Thyroid Function Tests: No results for input(s): TSH, T4TOTAL, FREET4, T3FREE, THYROIDAB in the last 72 hours. Anemia Panel: No results for input(s): VITAMINB12, FOLATE, FERRITIN, TIBC, IRON, RETICCTPCT in the last 72 hours. Urine analysis:    Component Value Date/Time   COLORURINE YELLOW (A) 01/17/2018 1558   APPEARANCEUR CLEAR (A) 01/17/2018 1558   LABSPEC 1.018 01/17/2018 1558   PHURINE 5.0 01/17/2018 1558   GLUCOSEU NEGATIVE  01/17/2018 1558   HGBUR SMALL (A) 01/17/2018 1558   BILIRUBINUR NEGATIVE 01/17/2018 [place] 01/17/2018 1558   PROTEINUR 100 (A) 01/17/2018 1558   NITRITE NEGATIVE 01/17/2018 1558   LEUKOCYTESUR NEGATIVE 01/17/2018 1558    Radiological Exams on Admission: DG Chest [place] 1 View  Result Date: 01/24/2019 CLINICAL DATA:  Defibrillator went off EXAM: PORTABLE CHEST 1 VIEW COMPARISON:  01/01/2018, 01/17/2018 FINDINGS: Left-sided pacing device as before. Mild atelectasis at the bases. No consolidation or effusion. Stable cardiomediastinal silhouette. No pneumothorax. IMPRESSION: No active disease.  Mildly diminished lung volumes. Electronically Signed   By: Donavan Foil M.D.   On: 02/09/2019 03:08    EKG: Independently reviewed.   Assessment/Plan Principal Problem:   Defibrillator discharge Chronic reports no events since November per nurse documentation  We will keep patient for observation on telemetry Patient reportedly on amiodarone however could not be confirmed Cardiology consult with Dr. Paulino Rily with ER provider-recommendation to line magnet if recurrent shocks and hang amiodarone) Continue to trend troponins Follow magnesium and potassium and correct any imbalances  CKD 3 At baseline    Diabetes mellitus without complication (HCC) Hold oral hypoglycemics Supplemental insulin coverage    CAD (coronary artery disease) No complaints of chest pain Continue carvedilol, atorvastatin and aspirin    Chronic systolic CHF (congestive heart failure) (HCC) Euvolemic, continue carvedilol.  Not currently on ACE inhibitor likely related to kidney disease    Dementia without behavioral disturbance (HCC) -No behavioral disturbance      DVT prophylaxis: lovenox Code Status: full  Family Communication: daughter, wendy simpson Disposition Plan: Back to previous home environment Consults called: cardiology, Dr. Samul Dada MD [facility]     02/02/2019, 4:04 AM

## 2019-02-17 NOTE — ED Notes (Signed)
[employer] representative reports no events logged in the device memory since Nov 16th, 2020.

## 2019-02-17 NOTE — ED Triage Notes (Signed)
Pt son in law states that the pt defibrillator has gone off tonight. Says pt does get confused, so he does not know how many times it went off "2-3". Pt diagnosed with COVID recently, denies increased SOB(does have COPD, breathing not worse than baseline) or fevers.

## 2019-02-17 NOTE — Progress Notes (Signed)
Received IV consult to insert PIV. Called floor and spoke to Network engineer who said RN was in a room. Asked her to pass info to RN that pt. Doesn't have anything ordered IV. We would put one in if the MD ordered one though. Will wait to hear.

## 2019-02-17 NOTE — Plan of Care (Signed)
  Problem: Education: Goal: Knowledge of General Education information will improve Description: Including pain rating scale, medication(s)/side effects and non-pharmacologic comfort measures Outcome: Not Progressing Note: Patient admitted to room, is alert to self but confused on place, time and situation. Unable to complete admission profile at this time. Patient pulled out his IV and intermittently removes tele. MD aware. Per MD ok if no PIV.

## 2019-02-17 NOTE — Consult Note (Signed)
[facility] Cardiology Consultation Note  Patient ID: Lance Nunez, MRN: [id number], DOB/AGE: 68-05-1950 68 y.o. Admit date: 02/07/2019   Date of Consult: 02/15/2019 Primary Physician: Donnie Coffin, MD Primary Cardiologist: Nehemiah Massed  Chief Complaint:  Chief Complaint  Patient presents with  . AICD Problem   Reason for Consult: Cardiovascular disease  HPI: 68 y.o. male with known coronary disease status post previous myocardial infarction PCI and stent placements in the past who has had moderate LV systolic dysfunction congestive heart failure diabetes hypertension hyperlipidemia.  The patient had a defibrillator placed in 2017 for which has been working well and he has been on appropriate medication management.  Recently the patient has had some shortness of breath and other significant symptoms and has been tested Covid positive.  This may be part of his issue but he claimed that there was concerns of the possible shocking from his defibrillator.  After defibrillator evaluation it does appear that he has had some nonsustained ventricular tachycardia in the past but no evidence of significant new issues in the last several weeks and no evidence of shocking.  The patient has had normal sinus rhythm left atrial enlargement and nonspecific ST changes by EKG with a chest x-ray which appears to be normal and chronic kidney disease with a glomerular filtration rate of 51 and a troponin of 31.  This may be all symptoms and issues of Covid but currently there is no apparent myocardial infarction ischemia and or congestive heart failure.  The patient is hemodynamically stable at this time in the emergency room  Past Medical History:  Diagnosis Date  . Chronic systolic CHF (congestive heart failure) ([place]) 01/01/2018  . COPD (chronic obstructive pulmonary disease) ([place])   . Coronary artery disease   . Diabetes mellitus without complication ([place])   . DM (diabetes mellitus), type 2 with renal  complications ([place]) [id number]  . Hypertension   . Myocardial infarction [facility])    95' 97' 98'      Surgical History:  Past Surgical History:  Procedure Laterality Date  . COLONOSCOPY WITH PROPOFOL N/A 12/10/2018   Procedure: COLONOSCOPY WITH PROPOFOL;  Surgeon: Virgel Manifold, MD;  Location: [facility] ENDOSCOPY;  Service: Endoscopy;  Laterality: N/A;  . Fractured leg Right   . IMPLANTABLE CARDIOVERTER DEFIBRILLATOR (ICD) GENERATOR CHANGE N/A 09/06/2015   Procedure: ICD GENERATOR CHANGE;  Surgeon: Isaias Cowman, MD;  Location: [facility] ORS;  Service: Cardiovascular;  Laterality: N/A;  . IMPLANTABLE CARDIOVERTER DEFIBRILLATOR (ICD) GENERATOR CHANGE Left 09/13/2015   Procedure: ICD GENERATOR CHANGE/REVISION;  Surgeon: Isaias Cowman, MD;  Location: [facility] ORS;  Service: Cardiovascular;  Laterality: Left;  . Stents N/A    4 cardiac stents     Home Meds: Prior to Admission medications   Medication Sig Start Date End Date Taking? Authorizing Provider  acetaminophen (TYLENOL) 325 MG tablet Take 650 mg by mouth every 4 (four) hours as needed for mild pain or moderate pain.   Yes [provider]  albuterol (PROVENTIL HFA;VENTOLIN HFA) 108 (90 Base) MCG/ACT inhaler Inhale 1-2 puffs into the lungs every 6 (six) hours as needed for wheezing or shortness of breath. 08/18/17  Yes Demetrios Loll, MD  aspirin EC 81 MG tablet Take 81 mg by mouth daily.   Yes [provider]  atorvastatin (LIPITOR) 40 MG tablet Take 1 tablet (40 mg total) by mouth daily at 6 PM. 08/18/17  Yes Demetrios Loll, MD  budesonide-formoterol [facility]) 160-4.5 MCG/ACT inhaler Inhale 2 puffs into the lungs 2 (two)  times daily. 02/23/18  Yes [provider]  carvedilol (COREG) 12.5 MG tablet Take 12.5 mg by mouth 2 (two) times daily with a meal. 01/06/18  Yes [provider]  Cholecalciferol (D3 MAXIMUM STRENGTH PO) Take 6,000 Units by mouth daily.   Yes [provider]  cyanocobalamin 1000 MCG  tablet Take 1,000 mcg by mouth daily. 02/24/18  Yes [provider]  docusate sodium (COLACE) 250 MG capsule Take 250 mg by mouth daily as needed for constipation.   Yes [provider]  famotidine (PEPCID) 20 MG tablet Take 1 tablet (20 mg total) by mouth daily. 01/06/18 02/05/2019 Yes Pyreddy, Reatha Harps, MD  furosemide (LASIX) 20 MG tablet Take 20 mg by mouth at bedtime as needed. For lower extremity swelling. 02/23/18  Yes [provider]  nitroGLYCERIN (NITROSTAT) 0.4 MG SL tablet Place 0.4 mg under the tongue every 5 (five) minutes as needed for chest pain.   Yes [provider]  QUEtiapine (SEROQUEL) 25 MG tablet Take 1 tablet (25 mg total) by mouth at bedtime. Patient taking differently: Take 100 mg by mouth at bedtime.  01/06/18 02/18/2019 Yes Pyreddy, Reatha Harps, MD  sertraline (ZOLOFT) 50 MG tablet Take 50 mg by mouth daily. 01/19/19  Yes [provider]  amiodarone (PACERONE) 200 MG tablet Take 1 tablet (200 mg total) by mouth daily. Patient not taking: Reported on 02/03/2019 08/18/17   Demetrios Loll, MD  donepezil (ARICEPT) 5 MG tablet Take 5 mg by mouth at bedtime. 01/13/18   [provider]  ergocalciferol (VITAMIN D2) 1.25 MG (50000 UT) capsule Take 50,000 Units by mouth once a week. 03/02/18   [provider]  glipiZIDE (GLUCOTROL) 5 MG tablet Take 10 mg by mouth 2 (two) times daily before a meal.  01/13/18   [provider]  magnesium hydroxide (MILK OF MAGNESIA) 400 MG/5ML suspension Take 30 mLs by mouth every 4 (four) hours as needed for mild constipation. 01/07/18   [provider]  Melatonin 3 MG TABS Take 1 tablet by mouth at bedtime. 01/12/18   [provider]  metFORMIN (GLUCOPHAGE) 500 MG tablet Take 500 mg by mouth 2 (two) times daily with a meal. As needed    [provider]  mometasone-formoterol (DULERA) 200-5 MCG/ACT AERO Inhale 2 puffs into the lungs 2 (two) times daily. Patient not taking:  Reported on 02/12/2019 08/19/17   Demetrios Loll, MD  OXYGEN Inhale 2 L/min into the lungs continuous.    [provider]  UNABLE TO FIND CPAP at Bedtime    [provider]    Inpatient Medications:  . aspirin EC  81 mg Oral Daily  . atorvastatin  40 mg Oral q1800  . carvedilol  12.5 mg Oral BID WC  . donepezil  5 mg Oral QHS  . enoxaparin (LOVENOX) injection  40 mg Subcutaneous Q24H  . Melatonin  2.5 mg Oral QHS  . mometasone-formoterol  2 puff Inhalation BID  . QUEtiapine  100 mg Oral QHS  . sertraline  50 mg Oral Daily     Allergies:  Allergies  Allergen Reactions  . Prednisone Other (See Comments)    Pt states, "I just cant take it, I dont know"  . Axid [Nizatidine]   . Codeine Itching    Social History   Socioeconomic History  . Marital status: Widowed    Spouse name: Not on file  . Number of children: Not on file  . Years of education: Not on file  . Highest  education level: Not on file  Occupational History  . Occupation: disabled  Tobacco Use  . Smoking status: Former Smoker    Quit date: 07/19/2004    Years since quitting: 14.5  . Smokeless tobacco: Never Used  Substance and Sexual Activity  . Alcohol use: No  . Drug use: No  . Sexual activity: Not on file  Other Topics Concern  . Not on file  Social History Narrative  . Not on file   Social Determinants of Health   Financial Resource Strain:   . Difficulty of Paying Living Expenses: Not on file  Food Insecurity:   . Worried About Charity fundraiser in the Last Year: Not on file  . Ran Out of Food in the Last Year: Not on file  Transportation Needs:   . Lack of Transportation (Medical): Not on file  . Lack of Transportation (Non-Medical): Not on file  Physical Activity:   . Days of Exercise per Week: Not on file  . Minutes of Exercise per Session: Not on file  Stress:   . Feeling of Stress : Not on file  Social Connections:   . Frequency of Communication with Friends and Family:  Not on file  . Frequency of Social Gatherings with Friends and Family: Not on file  . Attends Religious Services: Not on file  . Active Member of Clubs or Organizations: Not on file  . Attends Archivist Meetings: Not on file  . Marital Status: Not on file  Intimate Partner Violence:   . Fear of Current or Ex-Partner: Not on file  . Emotionally Abused: Not on file  . Physically Abused: Not on file  . Sexually Abused: Not on file     No family history on file.   Review of Systems Positive for shortness of breath  Negative for: General:  chills, fever, night sweats or weight changes.  Cardiovascular: PND orthopnea syncope dizziness  Dermatological skin lesions rashes Respiratory: Cough congestion Urologic: Frequent urination urination at night and hematuria Abdominal: negative for nausea, vomiting, diarrhea, bright red blood per rectum, melena, or hematemesis Neurologic: negative for visual changes, and/or hearing changes  All other systems reviewed and are otherwise negative except as noted above.  Labs: No results for input(s): CKTOTAL, CKMB, TROPONINI in the last 72 hours. Lab Results  Component Value Date   WBC 4.0 01/20/2019   HGB 9.4 (L) 02/07/2019   HCT 29.6 (L) 02/01/2019   MCV 71.3 (L) 01/20/2019   PLT 141 (L) 01/29/2019    Recent Labs  Lab 02/02/2019 0411  NA 136  K 4.3  CL 101  CO2 28  BUN 29*  CREATININE 1.60*  CALCIUM 7.9*  GLUCOSE 196*   Lab Results  Component Value Date   CHOL 217 (H) 08/18/2017   HDL 39 (L) 08/18/2017   LDLCALC 141 (H) 08/18/2017   TRIG 183 (H) 08/18/2017   No results found for: DDIMER  Radiology/Studies:  DG Chest Port 1 View  Result Date: 01/20/2019 CLINICAL DATA:  Defibrillator went off EXAM: PORTABLE CHEST 1 VIEW COMPARISON:  01/01/2018, 01/17/2018 FINDINGS: Left-sided pacing device as before. Mild atelectasis at the bases. No consolidation or effusion. Stable cardiomediastinal silhouette. No pneumothorax.  IMPRESSION: No active disease.  Mildly diminished lung volumes. Electronically Signed   By: Donavan Foil M.D.   On: 02/10/2019 03:08    EKG: Normal sinus rhythm left atrial enlargement nonspecific ST changes  Weights: Filed Weights   02/10/2019 0400  Weight: 93.4 kg  Physical Exam: Blood pressure 106/83, pulse 68, temperature 98.7 F (37.1 C), temperature source Oral, resp. rate 20, weight 93.4 kg, SpO2 92 %. Body mass index is 35.34 kg/m. General: Well developed, well nourished, in no acute distress. Head eyes ears nose throat: Normocephalic, atraumatic, sclera non-icteric, no xanthomas, nares are without discharge. No apparent thyromegaly and/or mass  Lungs: Normal respiratory effort.  no wheezes, no rales, no rhonchi.  Heart: RRR with normal S1 S2. no murmur gallop, no rub, PMI is normal size and placement, carotid upstroke normal without bruit, jugular venous pressure is normal Abdomen: Soft, non-tender, non-distended with normoactive bowel sounds. No hepatomegaly. No rebound/guarding. No obvious abdominal masses. Abdominal aorta is normal size without bruit Extremities: No edema. no cyanosis, no clubbing, no ulcers  Peripheral : 2+ bilateral upper extremity pulses, 2+ bilateral femoral pulses, 2+ bilateral dorsal pedal pulse Neuro: Alert and oriented. No facial asymmetry. No focal deficit. Moves all extremities spontaneously. Musculoskeletal: Normal muscle tone without kyphosis Psych:  Responds to questions appropriately with a normal affect.    Assessment: 68 year old male with hypertension hyperlipidemia diabetes cardiomyopathy LV systolic dysfunction coronary artery disease with Covid infection and apparent chest discomfort as if he was shocked but no evidence of shock by interrogation of defibrillator currently on appropriate medication management without evidence of myocardial infarction or congestive heart failure  Plan: 1.  No further intervention at this time with no  apparent significant shocks from defibrillator and defibrillator parameters working well 2.  No further diagnostic testing at this time due to no evidence of myocardial infarction and no evidence of congestive heart failure 3.  Continue medication management for cardiomyopathy LV systolic dysfunction and risk factor management as before without change 4.  Okay for discharge home from cardiac standpoint with follow-up in 2 to 4 weeks after patient fully recovered from Covid infection which is likely the majority of the symptoms that he is having at this time  Signed, Corey Skains M.D. [facility] Cardiology 02/04/2019, 8:12 AM

## 2019-02-17 NOTE — ED Provider Notes (Signed)
[facility] Emergency Department Provider Note   ____________________________________________   First MD Initiated Contact with Patient 02/02/2019 [phone]     (approximate)  I have reviewed the triage vital signs and the nursing notes.   HISTORY  Chief Complaint AICD Problem  Level V caveat: Limited by dementia  HPI Lance Nunez is a 68 y.o. male who presents to the ED from home with a chief complaint of his AICD firing 2 to 3 times and chest pain.  Patient has an AICD for dilated cardiomyopathy and low ejection fraction.  History of CAD status post PTCA, hypertension, hyperlipidemia.  Patient and multiple family members also diagnosed with COVID-19 yesterday.  Denies fever, increased shortness of breath, abdominal pain, nausea or vomiting.       Past Medical History:  Diagnosis Date  . Chronic systolic CHF (congestive heart failure) ([place]) 01/01/2018  . COPD (chronic obstructive pulmonary disease) ([place])   . Coronary artery disease   . Diabetes mellitus without complication ([place])   . DM (diabetes mellitus), type 2 with renal complications ([place]) [id number]  . Hypertension   . Myocardial infarction [facility])    95' 97' 98'    Patient Active Problem List   Diagnosis Date Noted  . Encounter for screening colonoscopy   . Polyp of colon   . Rectal polyp   . Obstructive sleep apnea 01/17/2018  . Dyslipidemia associated with type 2 diabetes mellitus ([place]) 01/17/2018  . Delusion ([place]) 01/17/2018  . DM (diabetes mellitus), type 2 with renal complications ([place]) [id number]  . Vascular dementia with behavior disturbance ([place]) 01/15/2018  . COPD exacerbation ([place]) 01/02/2018  . CAD (coronary artery disease) 01/01/2018  . COPD with acute exacerbation ([place]) 01/01/2018  . Chronic systolic CHF (congestive heart failure) ([place]) 01/01/2018  . Acute respiratory failure with hypoxemia ([place]) 08/18/2017  . Chest pain 08/17/2017  . 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 Dilated cardiomyopathy ([place]) 09/10/2015  . Diabetes mellitus without complication ([place]) [id number]  . HTN, goal below 140/80 09/10/2015    Past Surgical History:  Procedure Laterality Date  . COLONOSCOPY WITH PROPOFOL N/A 12/10/2018   Procedure: COLONOSCOPY WITH PROPOFOL;  Surgeon: Virgel Manifold, MD;  Location: [facility] ENDOSCOPY;  Service: Endoscopy;  Laterality: N/A;  . Fractured leg Right   . IMPLANTABLE CARDIOVERTER DEFIBRILLATOR (ICD) GENERATOR CHANGE N/A 09/06/2015   Procedure: ICD GENERATOR CHANGE;  Surgeon: Isaias Cowman, MD;  Location: [facility] ORS;  Service: Cardiovascular;  Laterality: N/A;  . IMPLANTABLE CARDIOVERTER DEFIBRILLATOR (ICD) GENERATOR CHANGE Left 09/13/2015   Procedure: ICD GENERATOR CHANGE/REVISION;  Surgeon: Isaias Cowman, MD;  Location: [facility] ORS;  Service: Cardiovascular;  Laterality: Left;  . Stents N/A    4 cardiac stents    Prior to Admission medications   Medication Sig Start Date End Date Taking? Authorizing Provider  acetaminophen (TYLENOL) 325 MG tablet Take 650 mg by mouth every 4 (four) hours as needed for mild pain or moderate pain.    [provider]  albuterol (PROVENTIL HFA;VENTOLIN HFA) 108 (90 Base) MCG/ACT inhaler Inhale 1-2 puffs into the lungs every 6 (six) hours as needed for wheezing or shortness of breath. 08/18/17   Demetrios Loll, MD  amiodarone (PACERONE) 200 MG tablet Take 1 tablet (200 mg total) by mouth daily. 08/18/17   Demetrios Loll, MD  aspirin EC 81 MG tablet Take 81 mg by mouth daily.    [provider]  atorvastatin (LIPITOR) 40 MG tablet Take 1  tablet (40 mg total) by mouth daily at 6 PM. 08/18/17   Demetrios Loll, MD  carvedilol (COREG) 12.5 MG tablet Take 12.5 mg by mouth 2 (two) times daily with a meal. 01/06/18   [provider]  donepezil (ARICEPT) 5 MG tablet Take 5 mg by mouth at bedtime. 01/13/18   [provider]    famotidine (PEPCID) 20 MG tablet Take 1 tablet (20 mg total) by mouth daily. 01/06/18 02/05/18  Saundra Shelling, MD  glipiZIDE (GLUCOTROL) 5 MG tablet Take 5 mg by mouth daily before breakfast. 01/13/18   [provider]  magnesium hydroxide (MILK OF MAGNESIA) 400 MG/5ML suspension Take 30 mLs by mouth every 4 (four) hours as needed for mild constipation. 01/07/18   [provider]  Melatonin 3 MG TABS Take 1 tablet by mouth at bedtime. 01/12/18   [provider]  metFORMIN (GLUCOPHAGE) 500 MG tablet Take 500 mg by mouth daily.     [provider]  mometasone-formoterol (DULERA) 200-5 MCG/ACT AERO Inhale 2 puffs into the lungs 2 (two) times daily. 08/19/17   Demetrios Loll, MD  nitroGLYCERIN (NITROSTAT) 0.4 MG SL tablet Place 0.4 mg under the tongue every 5 (five) minutes as needed for chest pain.    [provider]  NON FORMULARY Diet Type:  NCS, NAS    [provider]  OXYGEN Inhale 2 L/min into the lungs continuous.    [provider]  QUEtiapine (SEROQUEL) 25 MG tablet Take 1 tablet (25 mg total) by mouth at bedtime. 01/06/18 12/10/18  Saundra Shelling, MD  UNABLE TO FIND CPAP at Bedtime    [provider]    Allergies Prednisone, Axid [nizatidine], and Codeine  No family history on file.  Social History Social History   Tobacco Use  . Smoking status: Former Smoker    Quit date: 07/19/2004    Years since quitting: 14.5  . Smokeless tobacco: Never Used  Substance Use Topics  . Alcohol use: No  . Drug use: No    Review of Systems  Constitutional: No fever/chills Eyes: No visual changes. ENT: No sore throat. Cardiovascular: Positive for AICD firing and chest pain. Respiratory: Denies shortness of breath. Gastrointestinal: No abdominal pain.  No nausea, no vomiting.  No diarrhea.  No constipation. Genitourinary: Negative for dysuria. Musculoskeletal: Negative for back pain. Skin: Negative for rash. Neurological:  Negative for headaches, focal weakness or numbness.   ____________________________________________   PHYSICAL EXAM:  VITAL SIGNS: ED Triage Vitals [02/14/2019 0231]  Enc Vitals Group     BP 121/64     Pulse Rate 71     Resp (!) 22     Temp 98.7 F (37.1 C)     Temp Source Oral     SpO2 90 %     Weight      Height      Head Circumference      Peak Flow      Pain Score      Pain Loc      Pain Edu?      Excl. in [place]?     Constitutional: Alert and oriented. Well appearing and in no acute distress. Eyes: Conjunctivae are normal. PERRL. EOMI. Head: Atraumatic. Nose: No congestion/rhinnorhea. Mouth/Throat: Mucous membranes are moist.  Oropharynx non-erythematous. Neck: No stridor.   Cardiovascular: Normal rate, regular rhythm. Grossly normal heart sounds.  Good peripheral circulation. Respiratory: Normal respiratory effort.  No retractions. Lungs CTAB. Gastrointestinal: Soft and nontender. No distention. No abdominal bruits. No  CVA tenderness. Musculoskeletal: No lower extremity tenderness nor edema.  No joint effusions. Neurologic:  Normal speech and language. No gross focal neurologic deficits are appreciated. No gait instability. Skin:  Skin is warm, dry and intact. No rash noted. Psychiatric: Mood and affect are normal. Speech and behavior are normal.  ____________________________________________   LABS (all labs ordered are listed, but only abnormal results are displayed)  Labs Reviewed  BASIC METABOLIC PANEL - Abnormal; Notable for the following components:      Result Value   Glucose, Bld 204 (*)    BUN 29 (*)    Creatinine, Ser 1.67 (*)    Calcium 8.2 (*)    GFR calc non Af Amer 41 (*)    GFR calc Af Amer 48 (*)    All other components within normal limits  CBC - Abnormal; Notable for the following components:   Hemoglobin 10.0 (*)    HCT 32.4 (*)    MCV 71.7 (*)    MCH 22.1 (*)    RDW 21.2 (*)    All other components within normal limits  TROPONIN I  (HIGH SENSITIVITY) - Abnormal; Notable for the following components:   Troponin I (High Sensitivity) 31 (*)    All other components within normal limits  RESPIRATORY PANEL BY RT PCR (FLU A&B, COVID)   ____________________________________________  EKG  ED ECG REPORT I, Samiyah Stupka J, the attending physician, personally viewed and interpreted this ECG.   Date: 01/25/2019  EKG Time: 0234  Rate: 71  Rhythm: normal EKG, normal sinus rhythm  Axis: LAD  Intervals:left bundle branch block  ST&T Change: Nonspecific  ____________________________________________  RADIOLOGY  ED MD interpretation: No acute cardiopulmonary process  Official radiology report(s): DG Chest Port 1 View  Result Date: 01/30/2019 CLINICAL DATA:  Defibrillator went off EXAM: PORTABLE CHEST 1 VIEW COMPARISON:  01/01/2018, 01/17/2018 FINDINGS: Left-sided pacing device as before. Mild atelectasis at the bases. No consolidation or effusion. Stable cardiomediastinal silhouette. No pneumothorax. IMPRESSION: No active disease.  Mildly diminished lung volumes. Electronically Signed   By: Donavan Foil M.D.   On: 02/03/2019 03:08    ____________________________________________   PROCEDURES  Procedure(s) performed (including Critical Care):  Procedures  CRITICAL CARE Performed by: Paulette Blanch   Total critical care time: 30 minutes  Critical care time was exclusive of separately billable procedures and treating other patients.  Critical care was necessary to treat or prevent imminent or life-threatening deterioration.  Critical care was time spent personally by me on the following activities: development of treatment plan with patient and/or surrogate as well as nursing, discussions with consultants, evaluation of patient's response to treatment, examination of patient, obtaining history from patient or surrogate, ordering and performing treatments and interventions, ordering and review of laboratory studies,  ordering and review of radiographic studies, pulse oximetry and re-evaluation of patient's condition.  ____________________________________________   INITIAL IMPRESSION / ASSESSMENT AND PLAN / ED COURSE  As part of my medical decision making, I reviewed the following data within the [place] History obtained from family, Nursing notes reviewed and incorporated, Labs reviewed, EKG interpreted, Old chart reviewed, Discussed with admitting physician and Notes from prior ED visits     Lance Nunez was evaluated in Emergency Department on 02/01/2019 for the symptoms described in the history of present illness. He was evaluated in the context of the global COVID-19 pandemic, which necessitated consideration that the patient might be at risk for infection with the SARS-CoV-2 virus that causes COVID-19. Institutional  protocols and algorithms that pertain to the evaluation of patients at risk for COVID-19 are in a state of rapid change based on information released by regulatory bodies including the CDC and federal and state organizations. These policies and algorithms were followed during the patient's care in the ED.    68 year old male with CAD, cardiomyopathy status post AICD who presents for defibrillator firing 2-3 times with chest pain.  Also has COVID-19. Differential diagnosis includes, but is not limited to, ACS, aortic dissection, pulmonary embolism, cardiac tamponade, pneumothorax, pneumonia, pericarditis, myocarditis, GI-related causes including esophagitis/gastritis, and musculoskeletal chest wall pain.    Room air saturations 90%.  I cannot find a record of patient's positive COVID-19 test.  Will reswab in the ED.   Clinical Course as of Feb 16 330  Wed Feb 17, 2019  0321 Spoke with patient's cardiologist Dr. Nehemiah Massed.  Unclear if patient takes Pacerone 200 mg.  It is indicated on his old chart but not on his medication list as of 11/30 at his cardiology  appointment.  Nurse had left a message with patient's son-in-law but he has not called back.  Will interrogate AICD.  If AICD continues to misfire then cardiology recommends amiodarone.   [JS]    Clinical Course User Index [JS] Paulette Blanch, MD     ____________________________________________   FINAL CLINICAL IMPRESSION(S) / ED DIAGNOSES  Final diagnoses:  [id number]  AICD discharge  Chest pain, unspecified type  Elevated troponin  AKI (acute kidney injury) ([place])  Anemia, unspecified type     ED Discharge Orders    None       Note:  This document was prepared using Dragon voice recognition software and may include unintentional dictation errors.   Paulette Blanch, MD 01/29/2019 [id number]

## 2019-02-18 ENCOUNTER — Inpatient Hospital Stay: Payer: Medicare Other

## 2019-02-18 DIAGNOSIS — J440 Chronic obstructive pulmonary disease with acute lower respiratory infection: Secondary | ICD-10-CM | POA: Diagnosis present

## 2019-02-18 DIAGNOSIS — N179 Acute kidney failure, unspecified: Secondary | ICD-10-CM

## 2019-02-18 DIAGNOSIS — E870 Hyperosmolality and hypernatremia: Secondary | ICD-10-CM | POA: Diagnosis present

## 2019-02-18 DIAGNOSIS — E785 Hyperlipidemia, unspecified: Secondary | ICD-10-CM | POA: Diagnosis present

## 2019-02-18 DIAGNOSIS — N1832 Chronic kidney disease, stage 3b: Secondary | ICD-10-CM | POA: Diagnosis present

## 2019-02-18 DIAGNOSIS — I248 Other forms of acute ischemic heart disease: Secondary | ICD-10-CM | POA: Diagnosis present

## 2019-02-18 DIAGNOSIS — E119 Type 2 diabetes mellitus without complications: Secondary | ICD-10-CM

## 2019-02-18 DIAGNOSIS — F0390 Unspecified dementia without behavioral disturbance: Secondary | ICD-10-CM | POA: Diagnosis present

## 2019-02-18 DIAGNOSIS — U071 COVID-19: Principal | ICD-10-CM

## 2019-02-18 DIAGNOSIS — J9622 Acute and chronic respiratory failure with hypercapnia: Secondary | ICD-10-CM | POA: Diagnosis present

## 2019-02-18 DIAGNOSIS — E1122 Type 2 diabetes mellitus with diabetic chronic kidney disease: Secondary | ICD-10-CM | POA: Diagnosis present

## 2019-02-18 DIAGNOSIS — R778 Other specified abnormalities of plasma proteins: Secondary | ICD-10-CM | POA: Diagnosis present

## 2019-02-18 DIAGNOSIS — J069 Acute upper respiratory infection, unspecified: Secondary | ICD-10-CM

## 2019-02-18 DIAGNOSIS — E875 Hyperkalemia: Secondary | ICD-10-CM | POA: Diagnosis present

## 2019-02-18 DIAGNOSIS — Z515 Encounter for palliative care: Secondary | ICD-10-CM | POA: Diagnosis not present

## 2019-02-18 DIAGNOSIS — J9621 Acute and chronic respiratory failure with hypoxia: Secondary | ICD-10-CM | POA: Diagnosis present

## 2019-02-18 DIAGNOSIS — Z66 Do not resuscitate: Secondary | ICD-10-CM | POA: Diagnosis not present

## 2019-02-18 DIAGNOSIS — I130 Hypertensive heart and chronic kidney disease with heart failure and stage 1 through stage 4 chronic kidney disease, or unspecified chronic kidney disease: Secondary | ICD-10-CM | POA: Diagnosis present

## 2019-02-18 DIAGNOSIS — I25118 Atherosclerotic heart disease of native coronary artery with other forms of angina pectoris: Secondary | ICD-10-CM | POA: Diagnosis not present

## 2019-02-18 DIAGNOSIS — I5022 Chronic systolic (congestive) heart failure: Secondary | ICD-10-CM

## 2019-02-18 DIAGNOSIS — I2510 Atherosclerotic heart disease of native coronary artery without angina pectoris: Secondary | ICD-10-CM | POA: Diagnosis present

## 2019-02-18 DIAGNOSIS — I420 Dilated cardiomyopathy: Secondary | ICD-10-CM | POA: Diagnosis present

## 2019-02-18 DIAGNOSIS — I252 Old myocardial infarction: Secondary | ICD-10-CM | POA: Diagnosis not present

## 2019-02-18 DIAGNOSIS — G9341 Metabolic encephalopathy: Secondary | ICD-10-CM | POA: Diagnosis present

## 2019-02-18 DIAGNOSIS — E860 Dehydration: Secondary | ICD-10-CM | POA: Diagnosis present

## 2019-02-18 DIAGNOSIS — Z4502 Encounter for adjustment and management of automatic implantable cardiac defibrillator: Secondary | ICD-10-CM | POA: Diagnosis not present

## 2019-02-18 DIAGNOSIS — J1282 Pneumonia due to coronavirus disease 2019: Secondary | ICD-10-CM | POA: Diagnosis present

## 2019-02-18 DIAGNOSIS — G9349 Other encephalopathy: Secondary | ICD-10-CM | POA: Diagnosis present

## 2019-02-18 DIAGNOSIS — D509 Iron deficiency anemia, unspecified: Secondary | ICD-10-CM | POA: Diagnosis present

## 2019-02-18 LAB — C-REACTIVE PROTEIN: CRP: 14.4 mg/dL — ABNORMAL HIGH (ref <1.0)

## 2019-02-18 LAB — GLUCOSE, CAPILLARY
Glucose-Capillary: 159 mg/dL — ABNORMAL HIGH (ref 70–99)
Glucose-Capillary: 163 mg/dL — ABNORMAL HIGH (ref 70–99)

## 2019-02-18 LAB — FERRITIN: Ferritin: 67 ng/mL (ref 24–336)

## 2019-02-18 MED ORDER — QUETIAPINE FUMARATE 25 MG PO TABS: 25.0000 mg | ORAL_TABLET | Freq: Two times a day (BID) | ORAL | Status: DC | PRN

## 2019-02-18 MED ORDER — SODIUM CHLORIDE 0.9 % IV SOLN
200.0000 mg | Freq: Once | INTRAVENOUS | Status: AC
  Administered 2019-02-18: 200 mg via INTRAVENOUS
  Filled 2019-02-18: qty 200

## 2019-02-18 MED ORDER — ASCORBIC ACID 500 MG PO TABS
500.0000 mg | ORAL_TABLET | Freq: Every day | ORAL | Status: DC
  Administered 2019-02-18 – 2019-02-22 (×5): 500 mg via ORAL
  Filled 2019-02-18 (×5): qty 1

## 2019-02-18 MED ORDER — DEXAMETHASONE SODIUM PHOSPHATE 10 MG/ML IJ SOLN
6.0000 mg | Freq: Two times a day (BID) | INTRAMUSCULAR | Status: AC
  Administered 2019-02-18 – 2019-02-22 (×10): 6 mg via INTRAVENOUS
  Filled 2019-02-18 (×10): qty 0.6

## 2019-02-18 MED ORDER — INSULIN ASPART 100 UNIT/ML ~~LOC~~ SOLN
0.0000 [IU] | Freq: Three times a day (TID) | SUBCUTANEOUS | Status: DC
  Administered 2019-02-18 – 2019-02-19 (×4): 4 [IU] via SUBCUTANEOUS
  Administered 2019-02-20: 09:00:00 7 [IU] via SUBCUTANEOUS
  Administered 2019-02-20 – 2019-02-21 (×3): 4 [IU] via SUBCUTANEOUS
  Administered 2019-02-21: 08:00:00 7 [IU] via SUBCUTANEOUS
  Administered 2019-02-21: 12:00:00 4 [IU] via SUBCUTANEOUS
  Administered 2019-02-22: 12:00:00 7 [IU] via SUBCUTANEOUS
  Administered 2019-02-22 – 2019-02-23 (×3): 4 [IU] via SUBCUTANEOUS
  Filled 2019-02-18 (×14): qty 1

## 2019-02-18 MED ORDER — INSULIN ASPART 100 UNIT/ML ~~LOC~~ SOLN
0.0000 [IU] | Freq: Every day | SUBCUTANEOUS | Status: DC
  Administered 2019-02-21: 2 [IU] via SUBCUTANEOUS
  Filled 2019-02-18: qty 1

## 2019-02-18 MED ORDER — SODIUM CHLORIDE 0.9 % IV SOLN
100.0000 mg | Freq: Every day | INTRAVENOUS | Status: AC
  Administered 2019-02-19 – 2019-02-22 (×4): 100 mg via INTRAVENOUS
  Filled 2019-02-18: qty 100
  Filled 2019-02-18: qty 20
  Filled 2019-02-18 (×2): qty 100

## 2019-02-18 MED ORDER — ENSURE ENLIVE PO LIQD
237.0000 mL | Freq: Two times a day (BID) | ORAL | Status: DC
  Administered 2019-02-19 – 2019-02-22 (×7): 237 mL via ORAL

## 2019-02-18 MED ORDER — ZINC SULFATE 220 (50 ZN) MG PO CAPS
220.0000 mg | ORAL_CAPSULE | Freq: Every day | ORAL | Status: DC
  Administered 2019-02-18 – 2019-02-22 (×5): 220 mg via ORAL
  Filled 2019-02-18 (×6): qty 1

## 2019-02-18 NOTE — Evaluation (Signed)
Physical Therapy Evaluation Patient Details Name: Lance Nunez MRN: [id number] DOB: 03-20-1950 Today's Date: 02/18/2019   History of Present Illness  Pt is a 68 y.o. male presenting to hospital 02/02/2019 with c/o AICD firing 2-3x's and chest pain; also recent COVID (+) diagnosis.  Per chart, no apparent significant shocks from defibrillator noted on interrogation of AICD.  Pt admitted with acute metabolic encephalopathy vs dementia, COVID (+), and COPD with chronic hypoxic respiratory failure.  PMH includes CHF, COPD, CAD, DM, MI, vascular dementia, s/p AICD.  Clinical Impression  Pt resting in bed upon PT arrival; pt with difficulty answering therapists questions but pt gave therapist permission to talk with his daughter who reports at baseline pt is ambulatory (no AD use) and does not currently use O2 since living with her (pt had very occasionally used O2 when living alone); no recent falls; someone is home all the time with pt and pt has assist with medications and food; pt able to take a shower on his own; normally able to navigate steps on his own (has railings).  Currently pt is mod to max assist with bed mobility; min assist with transfers using walker; and CGA to min assist to ambulate 25 feet in room with RW.  1st stand pt appearing weak and shaky and needing sitting rest break prior to attempting ambulation.  After ambulation trial, pt appearing SOB and pt's O2 noted to be 90% on 4 L O2 via nasal cannula (pt's O2 sats increased back to upper 90's on 4 L O2 with rest break); MD [place] notified of pt's O2 during session.  Pt required increased time with all activities (d/t generalized weakness and difficulty following commands); pt able to verbalize back to therapist what therapist was asking him to do but intermittently would either not do it or do the opposite of it).  Of note, pt also appeared unfamiliar with walker use requiring extra cueing and assist to utilize walker safely.  Pt would  benefit from skilled PT to address noted impairments and functional limitations (see below for any additional details).  Upon hospital discharge, pt would benefit from STR; if pt discharges home instead, pt would require 24/7 assist, RW, BSC, manual w/c (d/t impaired activity tolerance and weakness), and HHPT.    Follow Up Recommendations SNF    Equipment Recommendations  Rolling walker with 5" wheels;3in1 (PT);Wheelchair (measurements PT);Wheelchair cushion (measurements PT)    Recommendations for Other Services OT consult     Precautions / Restrictions Precautions Precautions: Fall Precaution Comments: AICD Restrictions Weight Bearing Restrictions: No      Mobility  Bed Mobility Overal bed mobility: Needs Assistance Bed Mobility: Supine to Sit;Sit to Supine     Supine to sit: Mod assist;Max assist;HOB elevated Sit to supine: Mod assist;Max assist;HOB elevated   General bed mobility comments: assist for trunk and B LE's semi-supine to/from sit with HOB elevated; therapist gave pt extra time and cueing to perform on own but pt eventually required assist d/t weakness  Transfers Overall transfer level: Needs assistance Equipment used: Rolling walker (2 wheeled) Transfers: Sit to/from Stand Sit to Stand: Min assist         General transfer comment: x2 trials standing from bed (pt had significant difficulty with hand placement even with max vc's and tactile cues) up to walker  Ambulation/Gait Ambulation/Gait assistance: Min guard;Min assist Gait Distance (Feet): 25 Feet Assistive device: Rolling walker (2 wheeled)   Gait velocity: decreased   General Gait Details: decreased B LE  step length; vc's to stay within walker with turns; forward flexed posture; limited distance d/t SOB and fatigue  Stairs            Wheelchair Mobility    Modified Rankin (Stroke Patients Only)       Balance Overall balance assessment: Needs assistance Sitting-balance support: No  upper extremity supported;Feet supported Sitting balance-Leahy Scale: Good Sitting balance - Comments: steady sitting reaching within BOS   Standing balance support: Bilateral upper extremity supported Standing balance-Leahy Scale: Poor Standing balance comment: pt requiring B UE support on walker for static standing balance                             Pertinent Vitals/Pain Pain Assessment: Faces Faces Pain Scale: No hurt Pain Intervention(s): Limited activity within patient's tolerance;Monitored during session;Repositioned  HR WFL during session's activities    Home Living Family/patient expects to be discharged to:: Private residence Living Arrangements: Children(Pt's daughter) Available Help at Discharge: Family;Available 24 hours/day Type of Home: House Home Access: Stairs to enter Entrance Stairs-Rails: Right;Left;Can reach both Entrance Stairs-Number of Steps: 1-2 Home Layout: One level Home Equipment: None      Prior Function Level of Independence: Needs assistance   Gait / Transfers Assistance Needed: Ambulatory (no AD).  No recent falls per pt's daughter.  ADL's / Homemaking Assistance Needed: Takes showers on own.  Assist for medications and meals.  Comments: Pt's daughter reports someone is home all the time with pt; also pt does not use O2 at home since living with daughter (pt used O2 very occasionally when living on own prior to that).     Hand Dominance        Extremity/Trunk Assessment   Upper Extremity Assessment Upper Extremity Assessment: Difficult to assess due to impaired cognition;Generalized weakness    Lower Extremity Assessment Lower Extremity Assessment: Difficult to assess due to impaired cognition;Generalized weakness    Cervical / Trunk Assessment Cervical / Trunk Assessment: (forward head)  Communication   Communication: HOH  Cognition Arousal/Alertness: Awake/alert Behavior During Therapy: Flat affect Overall Cognitive  Status: No family/caregiver present to determine baseline cognitive functioning                                 General Comments: Oriented to name only; inconsistent with following 1 step commands (pt able to verbalize what therapist asked him to do but then pt unable to do it or would do opposite of it intermittently)      General Comments   Nursing cleared pt for participation in physical therapy.  Pt agreeable to PT session.    Exercises  Transfer and gait training   Assessment/Plan    PT Assessment Patient needs continued PT services  PT Problem List Decreased strength;Decreased activity tolerance;Decreased balance;Decreased mobility;Decreased knowledge of use of DME;Decreased cognition;Decreased safety awareness;Decreased knowledge of precautions;Cardiopulmonary status limiting activity       PT Treatment Interventions DME instruction;Gait training;Stair training;Functional mobility training;Therapeutic activities;Therapeutic exercise;Balance training;Patient/family education    PT Goals (Current goals can be found in the Care Plan section)  Acute Rehab PT Goals Patient Stated Goal: to improve strength PT Goal Formulation: With patient Time For Goal Achievement: 03/04/19 Potential to Achieve Goals: Fair    Frequency Min 2X/week   Barriers to discharge Decreased caregiver support      Co-evaluation  AM-PAC PT "6 Clicks" Mobility  Outcome Measure Help needed turning from your back to your side while in a flat bed without using bedrails?: A Little Help needed moving from lying on your back to sitting on the side of a flat bed without using bedrails?: A Lot Help needed moving to and from a bed to a chair (including a wheelchair)?: A Little Help needed standing up from a chair using your arms (e.g., wheelchair or bedside chair)?: A Little Help needed to walk in hospital room?: A Little Help needed climbing 3-5 steps with a railing? : A  Lot 6 Click Score: 16    End of Session Equipment Utilized During Treatment: Gait belt;Oxygen(4 L O2 via nasal cannula) Activity Tolerance: Patient limited by fatigue Patient left: in bed;with call bell/phone within reach;with bed alarm set;Other (comment)(B heels floating via pillow support) Nurse Communication: Mobility status;Precautions PT Visit Diagnosis: Other abnormalities of gait and mobility (R26.89);Muscle weakness (generalized) (M62.81);Difficulty in walking, not elsewhere classified (R26.2)    Time: [id number] PT Time Calculation (min) (ACUTE ONLY): 60 min   Charges:   PT Evaluation $PT Eval Low Complexity: 1 Low PT Treatments $Therapeutic Activity: 23-37 mins        Leitha Bleak, PT 02/18/19, 12:53 PM

## 2019-02-18 NOTE — Progress Notes (Signed)
PROGRESS NOTE    Lance Nunez  [id number] DOB: Aug 31, 1950 DOA: 01/25/2019 PCP: Lance Coffin, MD      Brief Narrative:  Lance Nunez is a 68 y.o. M with sCHF with AICD, CAD, CKD IIIa baseline 1.5, HTN, and dementia who presented with AICD discharge.  In the ER he was afebrile, HR and BP normal. Respirations 22 and 90% on room air.  CXR showed no disease.      Assessment & Plan:  Coronavirus pneumonitis Acute hypoxic respiratory failure Previous documentation erroneously stated the patient is on 2 L oxygen at baseline.  Confirmed with family today patient does not use oxygen at home for the last year, and oxygen saturations normally okay.  Here however in the last 24 hours, he has had escalating oxygen needs, now requiring 4 L, respirations 30 times per minute, shallow. -Start remdesivir -Start dexamethasone -DVT prophylaxis with Lovenox -Continue zinc and vitamin C -Nutrition consult    Chest discomfort Patient presented with chief complaint of "my ICD fired".  Unfortunately interrogation of his ICD showed that it had not in fact fired.  Cardiology were consulted, who recommended no further work-up, continuing his home cardiovascular meds. -Appreciate cardiology input  Acute metabolic encephalopathy superimposed on dementia Mild COVID-19 encephalopathy  COPD  No wheezing to suggest bronchospasm -Continue Advair  Ischemic heart disease -Continue aspirin, atorvastatin, carvedilol  Dementia -Continue donepezil, quetiapine -Continue melatonin  Diabetes Glucose elevated -Hold glipizide, Metformin -Continue sliding scale corrections    CKD IIIb Creatinine at baseline 1.6, stable  Microcytic anemia -Check iron studies -FOBT as able      DVT prophylaxis: Lovenox Code Status: Full code Family Communication: Daughter by phone MDM and disposition Plan:  The below labs and imaging reports reviewed and summarized above.  Medication management as  above.    The patient was admitted with confusion, found to have COVID-19 with acute hypoxic respiratory failure.  At this time, continued inpatient services are reasonable and expected, given the patient's age, comorbid diabetes, dementia, ischemic heart disease, congestive heart failure, and escalating new oxygen requirements, for which we are starting IV remdesivir and steroids.     Barriers to discharge: Clinical improvement       Objective: Vitals:   02/01/2019 2052 02/18/19 0439 02/18/19 0830 02/18/19 0917  BP:  129/83 124/66   Pulse:  84 80   Resp:  (!) 21 (!) 30 20  Temp: 97.8 F (36.6 C) 98.7 F (37.1 C) 98.8 F (37.1 C)   TempSrc: Oral Oral Oral   SpO2:  91% 91%   Weight:      Height:       No intake or output data in the 24 hours ending 02/18/19 1359 Filed Weights   02/18/2019 0400 02/10/2019 1447  Weight: 93.4 kg 95.6 kg    Examination: General appearance: Elderly adult male, awake, watching TV and in no obvious distress.  Sitting up in bed. HEENT: Anicteric, conjunctiva pink, lids and lashes normal. No nasal deformity, discharge, epistaxis.  Lips dry, oropharynx tacky dry, no oral lesions, hearing normal Skin: Warm and dry.  No suspicious rashes or lesions. Cardiac: RRR, no murmurs appreciated.  JVP not visible.  No LE edema.    Respiratory: Tachypneic, lung sounds diminished.  CTAB without rales or wheezes. Abdomen: Abdomen soft.  No TTP or guarding. No ascites, distension, hepatosplenomegaly.   MSK: No deformities or effusions of the large joints of the upper or lower extremities bilaterally. Neuro: Awake and alert to me, makes  eye contact, oriented to self only, muscle tone diminished, moves all extremities with generalized weakness, symmetric ordination, speech fluent    Psych: Sensorium intact and responding to questions, attention normal. Affect pleasant.  Judgment and insight appear impaired.      Data Reviewed: I have personally reviewed following  labs and imaging studies:  CBC: Recent Labs  Lab 01/27/2019 0243 02/04/2019 0410  WBC 4.2 4.0  HGB 10.0* 9.4*  HCT 32.4* 29.6*  MCV 71.7* 71.3*  PLT 165 [id number]*   Basic Metabolic Panel: Recent Labs  Lab 02/02/2019 0243 01/19/2019 0410 01/21/2019 0411  NA 137  --  136  K 4.2  --  4.3  CL 100  --  101  CO2 26  --  28  GLUCOSE 204*  --  196*  BUN 29*  --  29*  CREATININE 1.67*  --  1.60*  CALCIUM 8.2*  --  7.9*  MG  --  2.2  --    GFR: Estimated Creatinine Clearance: 47.8 mL/min (A) (by C-G formula based on SCr of 1.6 mg/dL (H)). Liver Function Tests: No results for input(s): AST, ALT, ALKPHOS, BILITOT, PROT, ALBUMIN in the last 168 hours. No results for input(s): LIPASE, AMYLASE in the last 168 hours. No results for input(s): AMMONIA in the last 168 hours. Coagulation Profile: No results for input(s): INR, PROTIME in the last 168 hours. Cardiac Enzymes: No results for input(s): CKTOTAL, CKMB, CKMBINDEX, TROPONINI in the last 168 hours. BNP (last 3 results) No results for input(s): PROBNP in the last 8760 hours. HbA1C: No results for input(s): HGBA1C in the last 72 hours. CBG: No results for input(s): GLUCAP in the last 168 hours. Lipid Profile: No results for input(s): CHOL, HDL, LDLCALC, TRIG, CHOLHDL, LDLDIRECT in the last 72 hours. Thyroid Function Tests: No results for input(s): TSH, T4TOTAL, FREET4, T3FREE, THYROIDAB in the last 72 hours. Anemia Panel: Recent Labs    02/18/19 1255  FERRITIN 67   Urine analysis:    Component Value Date/Time   COLORURINE YELLOW (A) 01/17/2018 1558   APPEARANCEUR CLEAR (A) 01/17/2018 1558   LABSPEC 1.018 01/17/2018 1558   PHURINE 5.0 01/17/2018 1558   GLUCOSEU NEGATIVE 01/17/2018 1558   HGBUR SMALL (A) 01/17/2018 1558   BILIRUBINUR NEGATIVE 01/17/2018 1558   KETONESUR NEGATIVE 01/17/2018 1558   PROTEINUR 100 (A) 01/17/2018 1558   NITRITE NEGATIVE 01/17/2018 1558   LEUKOCYTESUR NEGATIVE 01/17/2018 1558   Sepsis  Labs: @LABRCNTIP (procalcitonin:4,lacticacidven:4)  ) Recent Results (from the past 240 hour(s))  Respiratory Panel by RT PCR (Flu A&B, Covid) - Nasopharyngeal Swab     Status: Abnormal   Collection Time: 02/03/2019  3:37 AM   Specimen: Nasopharyngeal Swab  Result Value Ref Range Status   SARS Coronavirus 2 by RT PCR POSITIVE (A) NEGATIVE Final    Comment: RESULT CALLED TO, READ BACK BY AND VERIFIED WITH: Dorian Furnace RN [phone] 01/28/2019 HNM (NOTE) SARS-CoV-2 target nucleic acids are DETECTED. SARS-CoV-2 RNA is generally detectable in upper respiratory specimens  during the acute phase of infection. Positive results are indicative of the presence of the identified virus, but do not rule out bacterial infection or co-infection with other pathogens not detected by the test. Clinical correlation with patient history and other diagnostic information is necessary to determine patient infection status. The expected result is Negative. Fact Sheet for Patients:  PinkCheek.be Fact Sheet for Healthcare Providers: GravelBags.it This test is not yet approved or cleared by the [place] and  has been authorized for  detection and/or diagnosis of SARS-CoV-2 by FDA under an Emergency Use Authorization (EUA).  This EUA will remain in effect (meaning this test can be used)  for the duration of  the COVID-19 declaration under Section 564(b)(1) of the Act, 21 U.S.C. section 360bbb-3(b)(1), unless the authorization is terminated or revoked sooner.    Influenza A by PCR NEGATIVE NEGATIVE Final   Influenza B by PCR NEGATIVE NEGATIVE Final    Comment: (NOTE) The Xpert Xpress SARS-CoV-2/FLU/RSV assay is intended as an aid in  the diagnosis of influenza from Nasopharyngeal swab specimens and  should not be used as a sole basis for treatment. Nasal washings and  aspirates are unacceptable for Xpert Xpress SARS-CoV-2/FLU/RSV  testing. Fact  Sheet for Patients: PinkCheek.be Fact Sheet for Healthcare Providers: GravelBags.it This test is not yet approved or cleared by the [place] FDA and  has been authorized for detection and/or diagnosis of SARS-CoV-2 by  FDA under an Emergency Use Authorization (EUA). This EUA will remain  in effect (meaning this test can be used) for the duration of the  Covid-19 declaration under Section 564(b)(1) of the Act, 21  U.S.C. section 360bbb-3(b)(1), unless the authorization is  terminated or revoked. Performed at [facility], [address] [place], [place] [address]          Radiology Studies: DG Chest [place] 1 View  Result Date: 01/29/2019 CLINICAL DATA:  Defibrillator went off EXAM: PORTABLE CHEST 1 VIEW COMPARISON:  01/01/2018, 01/17/2018 FINDINGS: Left-sided pacing device as before. Mild atelectasis at the bases. No consolidation or effusion. Stable cardiomediastinal silhouette. No pneumothorax. IMPRESSION: No active disease.  Mildly diminished lung volumes. Electronically Signed   By: Donavan Foil M.D.   On: 01/26/2019 03:08        Scheduled Meds:  aspirin EC  81 mg Oral Daily   atorvastatin  40 mg Oral q1800   carvedilol  12.5 mg Oral BID WC   dexamethasone (DECADRON) injection  6 mg Intravenous Q12H   donepezil  5 mg Oral QHS   enoxaparin (LOVENOX) injection  40 mg Subcutaneous Q24H   Melatonin  2.5 mg Oral QHS   mometasone-formoterol  2 puff Inhalation BID   QUEtiapine  100 mg Oral QHS   sertraline  50 mg Oral Daily   Continuous Infusions:  remdesivir 200 mg in sodium chloride 0.9% 250 mL IVPB     Followed by   Derrill Memo ON 02/19/2019] remdesivir 100 mg in NS 100 mL       LOS: 0 days    Time spent: 35 minutes    Edwin Dada, MD [facility] 02/18/2019, 1:59 PM     Please page though [place] or Epic secure chat:  For password, contact charge nurse

## 2019-02-18 NOTE — Progress Notes (Signed)
[facility] Cardiology [facility] Encounter Note  Patient: Lance Nunez / Admit Date: 02/11/2019 / Date of Encounter: 02/18/2019, 8:37 AM   Subjective: Patient continue to be somewhat lethargic and with some dementia.  From last office visit the patient did have some dementia and difficulty with conversation unless he had family members with him.  The patient has not had any reports of significant chest discomfort or worsening shortness of breath or congestive heart failure at this time.  Elevated troponin most consistent with demand ischemia and possible hypoxia with chest x-ray being normal. Defibrillator interrogation shows no evidence of discharge despite the patient having some chest discomfort suggesting this.  There was rare nonsustained ventricular tachycardia in the past but nothing recent requiring further intervention or additional medication management  Review of Systems:   Objective: Telemetry: Normal sinus rhythm  Physical Exam: Blood pressure 124/66, pulse 80, temperature 98.8 F (37.1 C), temperature source Oral, resp. rate (!) 30, height 5\' 6"  (1.676 m), weight 95.6 kg, SpO2 91 %. Body mass index is 34.01 kg/m.   As per prime doc No intake or output data in the 24 hours ending 02/18/19 0837  Inpatient Medications:  . aspirin EC  81 mg Oral Daily  . atorvastatin  40 mg Oral q1800  . carvedilol  12.5 mg Oral BID WC  . donepezil  5 mg Oral QHS  . enoxaparin (LOVENOX) injection  40 mg Subcutaneous Q24H  . Melatonin  2.5 mg Oral QHS  . mometasone-formoterol  2 puff Inhalation BID  . QUEtiapine  100 mg Oral QHS  . sertraline  50 mg Oral Daily   Infusions:   Labs: Recent Labs    01/19/2019 0243 02/13/2019 0410 02/13/2019 0411  NA 137  --  136  K 4.2  --  4.3  CL 100  --  101  CO2 26  --  28  GLUCOSE 204*  --  196*  BUN 29*  --  29*  CREATININE 1.67*  --  1.60*  CALCIUM 8.2*  --  7.9*  MG  --  2.2  --    No results for input(s): AST, ALT, ALKPHOS, BILITOT,  PROT, ALBUMIN in the last 72 hours. Recent Labs    01/25/2019 0243 02/13/2019 0410  WBC 4.2 4.0  HGB 10.0* 9.4*  HCT 32.4* 29.6*  MCV 71.7* 71.3*  PLT 165 141*   No results for input(s): CKTOTAL, CKMB, TROPONINI in the last 72 hours. Invalid input(s): POCBNP No results for input(s): HGBA1C in the last 72 hours.   Weights: Filed Weights   01/26/2019 0400 02/15/2019 1447  Weight: 93.4 kg 95.6 kg     Radiology/Studies:  DG Chest Port 1 View  Result Date: 02/05/2019 CLINICAL DATA:  Defibrillator went off EXAM: PORTABLE CHEST 1 VIEW COMPARISON:  01/01/2018, 01/17/2018 FINDINGS: Left-sided pacing device as before. Mild atelectasis at the bases. No consolidation or effusion. Stable cardiomediastinal silhouette. No pneumothorax. IMPRESSION: No active disease.  Mildly diminished lung volumes. Electronically Signed   By: Donavan Foil M.D.   On: 01/22/2019 03:08     Assessment and Recommendation  68 y.o. male with known coronary disease status post previous coronary artery intervention stenting myocardial infarction LV systolic dysfunction hypertension hyperlipidemia diabetes chronic kidney disease with shocklike chest pain of unknown etiology with no evidence of defibrillator fire and no evidence of significant rhythm disturbances requiring further intervention at this time.  Patient does not have any evidence of congestive heart failure or myocardial infarction 1.  No  further cardiac intervention and/or diagnostics necessary at this time 2.  Continue carvedilol for cardiomyopathy and no ACE inhibitor at this time due to intolerance of the patient in the past 3.  High intensity cholesterol therapy 4.  Begin ambulation follow-up for improvements of symptoms and okay for discharge from home from cardiac standpoint with follow-up after patient is Covid negative  Signed, Serafina Royals M.D. FACC

## 2019-02-18 NOTE — Plan of Care (Signed)
  Problem: Education: Goal: Knowledge of General Education information will improve Description: Including pain rating scale, medication(s)/side effects and non-pharmacologic comfort measures Outcome: Not Progressing Note: Patient disoriented w/ hx of dementia. Discharge cancelled. Will start COVID treatment medications after IV inserted. Will continue to monitor overall progression for the remainder of the shift. [name] [facility]

## 2019-02-18 NOTE — Progress Notes (Signed)
Initial Nutrition Assessment  DOCUMENTATION CODES:   Obesity unspecified  INTERVENTION:  Recommend liberalizing diet to regular.  Provide Ensure Enlive po BID, each supplement provides 350 kcal and 20 grams of protein.  NUTRITION DIAGNOSIS:   Increased nutrient needs related to catabolic [id number] as evidenced by estimated needs.  GOAL:   Patient will meet greater than or equal to 90% of their needs  MONITOR:   PO intake, Supplement acceptance, Labs, Weight trends, I & O's, Skin  REASON FOR ASSESSMENT:   Consult Assessment of nutrition requirement/status  ASSESSMENT:   68 year old male with PMHx of COPD, DM, CAD, HTN, hx MI, chronic systolic CHF, CKD stage III, dementia admitted with COVID-19 pneumonitis.   Patient on airborne/contact precautions. Attempted to call patient over the phone but he was unable to answer. Noted history of dementia. He is currently on heart healthy diet. No meal completion data documented at this time. Patient has increased calorie/protein needs in setting of catabolic nature of [id number]. He would benefit from a liberalized diet and oral nutrition supplements.  Weight appears stable in chart. He appears to fluctuate between 95-100 kg, likely related to volume status. He is currently 95.6 kg (210.7 lbs).  Medications reviewed and include: vitamin C 500 mg daily, carvedilol, Decadron 6 mg Q12hrs, Novolog 0-20 units TID, Novolog 0-5 units QHS, Melatonin 2.5 mg QHS, Seroquel, sertraline, zinc sulfate 220 mg daily, remdesivir.  Labs reviewed: BUN 29, Creatinine 1.6.  Unable to determine if patient meets criteria for malnutrition at this time.  NUTRITION - FOCUSED PHYSICAL EXAM:  Unable to complete at this time.  Diet Order:   Diet Order            Diet Heart Room service appropriate? Yes; Fluid consistency: Thin  Diet effective now             EDUCATION NEEDS:   No education needs have been identified at this time  Skin:  Skin  Assessment: Reviewed RN Assessment(MSAD to groin)  Last BM:  Unknown  Height:   Ht Readings from Last 1 Encounters:  02/09/2019 5\' 6"  (1.676 m)   Weight:   Wt Readings from Last 1 Encounters:  01/31/2019 95.6 kg   Ideal Body Weight:  64.5 kg  BMI:  Body mass index is 34.01 kg/m.  Estimated Nutritional Needs:   Kcal:  2100-2300  Protein:  105-115 grams  Fluid:  1.8-2 L/day  Jacklynn Barnacle, MS, RD, LDN Office: [phone] Pager: [phone] After Hours/Weekend Pager: [phone]

## 2019-02-18 NOTE — Consult Note (Signed)
Remdesivir - Pharmacy Brief Note   O:  ALT: Not yet measured CXR: Evidence of lower respiratory infection on chest imaging SpO2: 91% on 4L   A/P:  Remdesivir 200 mg IVPB once followed by 100 mg IVPB daily x 4 days.   Lu Duffel, PharmD, BCPS Clinical Pharmacist 02/18/2019 12:41 PM

## 2019-02-18 NOTE — Plan of Care (Signed)
  Problem: Education: Goal: Knowledge of General Education information will improve Description: Including pain rating scale, medication(s)/side effects and non-pharmacologic comfort measures Outcome: Not Progressing   Problem: Health Behavior/Discharge Planning: Goal: Ability to manage health-related needs will improve Outcome: Not Progressing   Problem: Clinical Measurements: Goal: Respiratory complications will improve Outcome: Not Progressing   Problem: Nutrition: Goal: Adequate nutrition will be maintained Outcome: Not Progressing   Problem: Safety: Goal: Ability to remain free from injury will improve Outcome: Not Progressing

## 2019-02-19 DIAGNOSIS — I25118 Atherosclerotic heart disease of native coronary artery with other forms of angina pectoris: Secondary | ICD-10-CM

## 2019-02-19 LAB — GLUCOSE, CAPILLARY
Glucose-Capillary: 184 mg/dL — ABNORMAL HIGH (ref 70–99)
Glucose-Capillary: 189 mg/dL — ABNORMAL HIGH (ref 70–99)
Glucose-Capillary: 173 mg/dL — ABNORMAL HIGH (ref 70–99)
Glucose-Capillary: 170 mg/dL — ABNORMAL HIGH (ref 70–99)

## 2019-02-19 LAB — CBC
HCT: 34.4 % — ABNORMAL LOW (ref 39.0–52.0)
Hemoglobin: 10.4 g/dL — ABNORMAL LOW (ref 13.0–17.0)
MCH: 21.9 pg — ABNORMAL LOW (ref 26.0–34.0)
MCHC: 30.2 g/dL (ref 30.0–36.0)
MCV: 72.6 fL — ABNORMAL LOW (ref 80.0–100.0)
Platelets: 199 10*3/uL (ref 150–400)
RBC: 4.74 MIL/uL (ref 4.22–5.81)
RDW: 21.3 % — ABNORMAL HIGH (ref 11.5–15.5)
WBC: 7.9 10*3/uL (ref 4.0–10.5)
nRBC: 0 % (ref 0.0–0.2)

## 2019-02-19 LAB — COMPREHENSIVE METABOLIC PANEL
ALT: 23 U/L (ref 0–44)
AST: 35 U/L (ref 15–41)
Albumin: 3.1 g/dL — ABNORMAL LOW (ref 3.5–5.0)
Alkaline Phosphatase: 54 U/L (ref 38–126)
Anion gap: 8 (ref 5–15)
BUN: 38 mg/dL — ABNORMAL HIGH (ref 8–23)
CO2: 31 mmol/L (ref 22–32)
Calcium: 8.4 mg/dL — ABNORMAL LOW (ref 8.9–10.3)
Chloride: 103 mmol/L (ref 98–111)
Creatinine, Ser: 1.39 mg/dL — ABNORMAL HIGH (ref 0.61–1.24)
GFR calc Af Amer: 60 mL/min — ABNORMAL LOW (ref >60)
GFR calc non Af Amer: 52 mL/min — ABNORMAL LOW (ref >60)
Glucose, Bld: 193 mg/dL — ABNORMAL HIGH (ref 70–99)
Potassium: 5.3 mmol/L — ABNORMAL HIGH (ref 3.5–5.1)
Sodium: 142 mmol/L (ref 135–145)
Total Bilirubin: 0.7 mg/dL (ref 0.3–1.2)
Total Protein: 6.8 g/dL (ref 6.5–8.1)

## 2019-02-19 LAB — IRON AND TIBC
Iron: 14 ug/dL — ABNORMAL LOW (ref 45–182)
Saturation Ratios: 4 % — ABNORMAL LOW (ref 17.9–39.5)
TIBC: 325 ug/dL (ref 250–450)
UIBC: 311 ug/dL

## 2019-02-19 LAB — FIBRIN DERIVATIVES D-DIMER (ARMC ONLY): Fibrin derivatives D-dimer (ARMC): 1225.14 ng/mL (FEU) — ABNORMAL HIGH (ref 0.00–499.00)

## 2019-02-19 LAB — C-REACTIVE PROTEIN: CRP: 20.7 mg/dL — ABNORMAL HIGH (ref <1.0)

## 2019-02-19 MED ORDER — CHLORHEXIDINE GLUCONATE CLOTH 2 % EX PADS
6.0000 | MEDICATED_PAD | Freq: Every day | CUTANEOUS | Status: DC
  Administered 2019-02-19 – 2019-02-22 (×3): 6 via TOPICAL

## 2019-02-19 MED ORDER — FUROSEMIDE 40 MG PO TABS
40.0000 mg | ORAL_TABLET | Freq: Once | ORAL | Status: AC
  Administered 2019-02-19: 12:00:00 40 mg via ORAL
  Filled 2019-02-19: qty 1

## 2019-02-19 MED ORDER — HYDROXYZINE HCL 25 MG PO TABS
25.0000 mg | ORAL_TABLET | Freq: Once | ORAL | Status: AC
  Administered 2019-02-19: 16:00:00 25 mg via ORAL
  Filled 2019-02-19: qty 1

## 2019-02-19 MED ORDER — LINAGLIPTIN 5 MG PO TABS
5.0000 mg | ORAL_TABLET | Freq: Every day | ORAL | Status: DC
  Administered 2019-02-19 – 2019-02-22 (×4): 5 mg via ORAL
  Filled 2019-02-19 (×5): qty 1

## 2019-02-19 NOTE — Evaluation (Signed)
Occupational Therapy Evaluation Patient Details Name: Lance Nunez MRN: [id number] DOB: 1950/10/31 Today's Date: 02/19/2019    History of Present Illness Pt is a 69 y.o. male presenting to hospital 02/13/2019 with c/o AICD firing 2-3x's and chest pain; also recent COVID (+) diagnosis.  Per chart, no apparent significant shocks from defibrillator noted on interrogation of AICD.  Pt admitted with acute metabolic encephalopathy vs dementia, COVID (+), and COPD with chronic hypoxic respiratory failure.  PMH includes CHF, COPD, CAD, DM, MI, vascular dementia, s/p AICD.   Clinical Impression   Lance Nunez was seen for OT evaluation this date. Pt received semi-supine in bed with RN just leaving room after assisting pt with lunch this date. Pt oriented to self only, is unable to state DOB, place, or situation therefor information regarding PLOF and home set up obtained from chart review. Per chart, prior to hospital admission, pt was generally independent with BADL mgt.  Pt lives with his daughter in a 1 level home with 1-2 steps to enter. Currently pt demonstrates deficits as listed below (See OT problem list) which significantly limit his ability to perform self-care tasks at his baseline level. Pt currently requires max to total assistance for BADL tasks at bed level.  Pt would benefit from skilled OT to address noted impairments and functional limitations (see below for any additional details) in order to maximize safety and independence while minimizing falls risk and caregiver burden.  Upon hospital discharge, recommend STR to maximize pt safety and return to PLOF.     Follow Up Recommendations  SNF    Equipment Recommendations  3 in 1 bedside commode    Recommendations for Other Services       Precautions / Restrictions Precautions Precautions: Fall Precaution Comments: AICD Restrictions Weight Bearing Restrictions: No      Mobility Bed Mobility Overal bed mobility: Needs  Assistance             General bed mobility comments: Deferred for pt and therapist safety. Per chart, requires mod/max assist for sup<>sit with extra time and cueing to perform.  Transfers                      Balance Overall balance assessment: Needs assistance                                         ADL either performed or assessed with clinical judgement   ADL Overall ADL's : Needs assistance/impaired                                       General ADL Comments: Pt functionally limited by cognitive status, decreased activity tolerance, and generalized weakness. He requires +2 assist for functional mobility for safety, and is currently bed-level for all ADL tasks including self-feeding, grooming, toileting (with catheter) and bathing. OT attempts to engage pt in grooming tasks, but pt is unable to complete without max-total assist. Per RN, pt was unable to feed himself this date. Required max-total assist with meal tray. will continue to assess as cognition improves.     Vision   Additional Comments: Pt unable to state.     Perception     Praxis      Pertinent Vitals/Pain Faces Pain Scale: Hurts a little bit Pain Location:  Pt unable to state. Shakes head yes when asked about pain. Shakes head yes when asked "Does it hurt a little bit". Per RN, pt has been c/o pain 2/2 foley cath placement. Pain Intervention(s): Monitored during session;Limited activity within patient's tolerance     Hand Dominance (Pt unable to state.)   Extremity/Trunk Assessment Upper Extremity Assessment Upper Extremity Assessment: Difficult to assess due to impaired cognition;Generalized weakness   Lower Extremity Assessment Lower Extremity Assessment: Difficult to assess due to impaired cognition;Generalized weakness       Communication Communication Communication: HOH   Cognition Arousal/Alertness: Awake/alert Behavior During Therapy: Restless;Flat  affect Overall Cognitive Status: No family/caregiver present to determine baseline cognitive functioning                                 General Comments: Oriented to name only; inconsistent with following 1 step commands even with increased time/prompting to peform. Appears to have difficulty with expressive communication this date when asked "Are you right or left handed?" pt states "Left, left, left, left ha--, left ha--, hand". At one point states "Help" and then clearly "I need to get out of bed". Is re-directed with limited success. RN aware of cognition.   General Comments  Pt on 2L [place] t/o session. spO2 monitored; Noted to remain WFLs >93% t/o.    Exercises Other Exercises Other Exercises: OT attempts education in energy conservation including PLB, however education limited by pt cognitive status. OT engages pt in functional tasks including grooming at bed level. Pt rquires max A to wash face this date.   Shoulder Instructions      Home Living Family/patient expects to be discharged to:: Private residence Living Arrangements: Children(Pt dtr.) Available Help at Discharge: Family;Available 24 hours/day Type of Home: House Home Access: Stairs to enter [employer] of Steps: 1-2 Entrance Stairs-Rails: Right;Left;Can reach both Home Layout: One level     Bathroom Shower/Tub: Teacher, early years/pre: Standard     Home Equipment: None          Prior Functioning/Environment Level of Independence: Needs assistance  Gait / Transfers Assistance Needed: Ambulatory (no AD).  No recent falls per pt's daughter. ADL's / Homemaking Assistance Needed: Takes showers on own.  Assist for medications and meals.   Comments: Pt unable to provide A&O information. Per chart, Pt's daughter reports someone is home all the time with pt; also pt does not use O2 at home since living with daughter (pt used O2 very occasionally when living on own prior to that).         OT Problem List: Decreased strength;Decreased coordination;Cardiopulmonary status limiting activity;Decreased activity tolerance;Decreased safety awareness;Decreased cognition;Impaired balance (sitting and/or standing);Impaired UE functional use;Decreased knowledge of use of DME or AE      OT Treatment/Interventions: Self-care/ADL training;Therapeutic exercise;Therapeutic activities;DME and/or AE instruction;Patient/family education;Balance training;Energy conservation    OT Goals(Current goals can be found in the care plan section) Acute Rehab OT Goals Patient Stated Goal: Pt unable to state OT Goal Formulation: Patient unable to participate in goal setting Time For Goal Achievement: 03/05/19 ADL Goals Pt Will Perform Eating: with min assist;sitting;with adaptive utensils(With LRAD PRN for improved safety and functional independence.) Pt Will Perform Grooming: with min assist;sitting Pt Will Transfer to Toilet: stand pivot transfer;with +2 assist;with min assist;bedside commode(With LRAD PRN for improved safety and functional independence.) Pt Will Perform Toileting - Clothing Manipulation and hygiene: with adaptive equipment;sit to/from  stand;with mod assist(With LRAD PRN for improved safety and functional independence.)  OT Frequency: Min 1X/week   Barriers to D/C:            Co-evaluation              AM-PAC OT "6 Clicks" Daily Activity     Outcome Measure Help from another person eating meals?: A Lot Help from another person taking care of personal grooming?: A Lot Help from another person toileting, which includes using toliet, bedpan, or urinal?: Total Help from another person bathing (including washing, rinsing, drying)?: Total Help from another person to put on and taking off regular upper body clothing?: A Lot Help from another person to put on and taking off regular lower body clothing?: A Lot 6 Click Score: 10   End of Session    Activity Tolerance:  Patient tolerated treatment well Patient left: in bed;with call bell/phone within reach;with bed alarm set  OT Visit Diagnosis: Muscle weakness (generalized) (M62.81);Other abnormalities of gait and mobility (R26.89)                Time: 1357-1420 OT Time Calculation (min): 23 min Charges:  OT General Charges $OT Visit: 1 Visit OT Evaluation $OT Eval Moderate Complexity: 1 Mod OT Treatments $Self Care/Home Management : 8-22 mins  Shara Blazing, M.S., OTR/L Ascom: [phone] 02/19/19, 4:17 PM

## 2019-02-19 NOTE — Progress Notes (Signed)
PROGRESS NOTE    MARTELLE HEITZMAN  [id number] DOB: 08-19-1950 DOA: 02/09/2019 PCP: Donnie Coffin, MD      Brief Narrative:  Mr. Ruffolo is a 69 y.o. M with sCHF with AICD, CAD, CKD IIIa baseline 1.5, HTN, and dementia who presented with AICD discharge.  In the ER he was afebrile, HR and BP normal. Respirations 22 and 90% on room air.  CXR showed no disease.      Assessment & Plan:  Coronavirus pneumonitis Acute hypoxic respiratory failure Patient admitted with new O2 requirement 2L (Previous documentation erroneously stated the patient is on 2 L oxygen at baseline, he has not been for the last year).   Now, his O2 requirements are escalating to 4Lpm.  Inflammatory markers elevated.   -Continue remdesivir day 2 of 5 -Continue dexamethasone day 2 -Continue DVT prophylaxis with Lovenox -Continue zinc and vitamin C -Nutrition consult  -Lasix x1 today -Repeat CMP tomorrow   Acute metabolic encephalopathy superimposed on dementia COVID-19 encephalopathy, worse overnight, treated with PRN extra seroquel.  COPD  No wheezing to suggest bronchospasm -Continue Advair  Ischemic heart disease -Continue aspirin, atorvastatin, carvedilol  Dementia -Continue donepezil, quetiapine -Continue melatonin  Diabetes Glucose improved -Hold glipizide, Metformin -Continue sliding scale corrections   -Start linagliptin  CKD IIIb Cr stable, slightly better  Hyperkalemia Mild -Stop K containing fluids -Lasix x1  Microcytic anemia Iron studies suggest low iron sat. -Start oral iron and docusate -FOBT as outpatient and GI consultation   Chest discomfort Patient presented with chief complaint of "my ICD fired".  Unfortunately interrogation of his ICD showed that it had not in fact fired.  Cardiology were consulted, who recommended no further work-up, continuing his home cardiovascular meds. -Appreciate cardiology input       DVT prophylaxis: Lovenox Code  Status: Full code Family Communication: Daughter by phone  MDM and disposition Plan:  The below labs and imaging reports reviewed and summarized above.  Medication management as above.   The patient was admitted with confusion, found to have COVID-19 with acute hypoxic respiratory failure.  He remains on 4L new O2.  Requires ongoing remdesivir.        Barriers to discharge: Clinical improvement, weaning O2, completing remdesivir       Objective: Vitals:   02/18/19 1713 02/18/19 1954 02/19/19 0454 02/19/19 0821  BP:  123/69 (!) 156/80 132/83  Pulse:  68 62 (!) 54  Resp: (!) 25 (!) 31 (!) 22 20  Temp:  98.7 F (37.1 C)    TempSrc:  Oral    SpO2:  95% 93% 95%  Weight:      Height:        Intake/Output Summary (Last 24 hours) at 02/19/2019 1037 Last data filed at 02/19/2019 0606 Gross per 24 hour  Intake --  Output 900 ml  Net -900 ml   Filed Weights   02/02/2019 0400 01/30/2019 1447  Weight: 93.4 kg 95.6 kg    Examination: General appearance: elderly adult male,somnolent due to Seroquel, sleeping, rouses slowly.  No obvious distress.   HEENT: Anicteric, conjunctiva pink, lids and lashes normal. No nasal deformity, discharge, epistaxis.  Lips dry, OP dry, no oral lesions.   Skin: Warm and dry.  No suspicious rashes or lesions. Cardiac: RRR, no murmurs appreciated.  Trace LE edema.    Respiratory: Snoring, shallow, no rales or wheezing.    Abdomen: Abdomen soft.  No TTP or guarding. No ascites, distension, hepatosplenomegaly.   MSK: No deformities or effusions  of the large joints of the upper or lower extremities bilaterally.  Loss of subcutaneous fat. Neuro: Rouses slowly. Makes  Eye contact.  Moves upper extremitis with symmetric coordination, generalized weakness.  Speech at baseline, somewhat dysarthric.  Psych: Sensorium intact and responding to questions, attention diminished, somewhat sedated Affect blunted.  Judgment and insight appear impaired by dementia.         Data Reviewed: I have personally reviewed following labs and imaging studies:  CBC: Recent Labs  Lab 02/11/2019 0243 01/31/2019 0410 02/19/19 0446  WBC 4.2 4.0 7.9  HGB 10.0* 9.4* 10.4*  HCT 32.4* 29.6* 34.4*  MCV 71.7* 71.3* 72.6*  PLT 165 141* [id number]   Basic Metabolic Panel: Recent Labs  Lab 02/13/2019 0243 02/10/2019 0410 01/23/2019 0411 02/19/19 0446  NA 137  --  136 142  K 4.2  --  4.3 5.3*  CL 100  --  101 103  CO2 26  --  28 31  GLUCOSE 204*  --  196* 193*  BUN 29*  --  29* 38*  CREATININE 1.67*  --  1.60* 1.39*  CALCIUM 8.2*  --  7.9* 8.4*  MG  --  2.2  --   --    GFR: Estimated Creatinine Clearance: 55 mL/min (A) (by C-G formula based on SCr of 1.39 mg/dL (H)). Liver Function Tests: Recent Labs  Lab 02/19/19 0446  AST 35  ALT 23  ALKPHOS 54  BILITOT 0.7  PROT 6.8  ALBUMIN 3.1*   No results for input(s): LIPASE, AMYLASE in the last 168 hours. No results for input(s): AMMONIA in the last 168 hours. Coagulation Profile: No results for input(s): INR, PROTIME in the last 168 hours. Cardiac Enzymes: No results for input(s): CKTOTAL, CKMB, CKMBINDEX, TROPONINI in the last 168 hours. BNP (last 3 results) No results for input(s): PROBNP in the last 8760 hours. HbA1C: No results for input(s): HGBA1C in the last 72 hours. CBG: Recent Labs  Lab 02/18/19 1700 02/18/19 2120 02/19/19 0823  GLUCAP 159* 163* 184*   Lipid Profile: No results for input(s): CHOL, HDL, LDLCALC, TRIG, CHOLHDL, LDLDIRECT in the last 72 hours. Thyroid Function Tests: No results for input(s): TSH, T4TOTAL, FREET4, T3FREE, THYROIDAB in the last 72 hours. Anemia Panel: Recent Labs    02/18/19 1255 02/19/19 0446  FERRITIN 67  --   TIBC  --  325  IRON  --  14*   Urine analysis:    Component Value Date/Time   COLORURINE YELLOW (A) 01/17/2018 1558   APPEARANCEUR CLEAR (A) 01/17/2018 1558   LABSPEC 1.018 01/17/2018 1558   PHURINE 5.0 01/17/2018 1558   GLUCOSEU NEGATIVE  01/17/2018 1558   HGBUR SMALL (A) 01/17/2018 1558   BILIRUBINUR NEGATIVE 01/17/2018 1558   KETONESUR NEGATIVE 01/17/2018 1558   PROTEINUR 100 (A) 01/17/2018 1558   NITRITE NEGATIVE 01/17/2018 1558   LEUKOCYTESUR NEGATIVE 01/17/2018 1558   Sepsis Labs: @LABRCNTIP (procalcitonin:4,lacticacidven:4)  ) Recent Results (from the past 240 hour(s))  Respiratory Panel by RT PCR (Flu A&B, Covid) - Nasopharyngeal Swab     Status: Abnormal   Collection Time: 01/22/2019  3:37 AM   Specimen: Nasopharyngeal Swab  Result Value Ref Range Status   SARS Coronavirus 2 by RT PCR POSITIVE (A) NEGATIVE Final    Comment: RESULT CALLED TO, READ BACK BY AND VERIFIED WITH: Dorian Furnace RN [phone] 02/07/2019 HNM (NOTE) SARS-CoV-2 target nucleic acids are DETECTED. SARS-CoV-2 RNA is generally detectable in upper respiratory specimens  during the acute phase of infection. Positive results  are indicative of the presence of the identified virus, but do not rule out bacterial infection or co-infection with other pathogens not detected by the test. Clinical correlation with patient history and other diagnostic information is necessary to determine patient infection status. The expected result is Negative. Fact Sheet for Patients:  PinkCheek.be Fact Sheet for Healthcare Providers: GravelBags.it This test is not yet approved or cleared by the [place] FDA and  has been authorized for detection and/or diagnosis of SARS-CoV-2 by FDA under an Emergency Use Authorization (EUA).  This EUA will remain in effect (meaning this test can be used)  for the duration of  the COVID-19 declaration under Section 564(b)(1) of the Act, 21 U.S.C. section 360bbb-3(b)(1), unless the authorization is terminated or revoked sooner.    Influenza A by PCR NEGATIVE NEGATIVE Final   Influenza B by PCR NEGATIVE NEGATIVE Final    Comment: (NOTE) The Xpert Xpress SARS-CoV-2/FLU/RSV  assay is intended as an aid in  the diagnosis of influenza from Nasopharyngeal swab specimens and  should not be used as a sole basis for treatment. Nasal washings and  aspirates are unacceptable for Xpert Xpress SARS-CoV-2/FLU/RSV  testing. Fact Sheet for Patients: PinkCheek.be Fact Sheet for Healthcare Providers: GravelBags.it This test is not yet approved or cleared by the [place] FDA and  has been authorized for detection and/or diagnosis of SARS-CoV-2 by  FDA under an Emergency Use Authorization (EUA). This EUA will remain  in effect (meaning this test can be used) for the duration of the  Covid-19 declaration under Section 564(b)(1) of the Act, 21  U.S.C. section 360bbb-3(b)(1), unless the authorization is  terminated or revoked. Performed at [facility], [address] [place], [place] [address]          Radiology Studies: DG Chest [place] 1 View  Result Date: 02/18/2019 CLINICAL DATA:  COVID-19 positive EXAM: PORTABLE CHEST 1 VIEW COMPARISON:  01/31/2019 FINDINGS: Stable positioning of a left-sided implanted cardiac device. Mild cardiomegaly. Calcific aortic knob. Increasing hazy right lower lobe opacity. Linear atelectasis in the right mid lung. Left lung remains clear. No pleural effusion or pneumothorax. IMPRESSION: 1. Increasing hazy opacity in the right lower lobe could represent developing infiltrate or atelectasis. 2. Linear atelectasis in the right mid lung. Electronically Signed   By: Davina Poke D.O.   On: 02/18/2019 15:42        Scheduled Meds: . vitamin C  500 mg Oral Daily  . aspirin EC  81 mg Oral Daily  . atorvastatin  40 mg Oral q1800  . carvedilol  12.5 mg Oral BID WC  . dexamethasone (DECADRON) injection  6 mg Intravenous Q12H  . donepezil  5 mg Oral QHS  . enoxaparin (LOVENOX) injection  40 mg Subcutaneous Q24H  . feeding supplement (ENSURE ENLIVE)  237 mL Oral BID BM   . insulin aspart  0-20 Units Subcutaneous TID WC  . insulin aspart  0-5 Units Subcutaneous QHS  . Melatonin  2.5 mg Oral QHS  . mometasone-formoterol  2 puff Inhalation BID  . QUEtiapine  100 mg Oral QHS  . sertraline  50 mg Oral Daily  . zinc sulfate  220 mg Oral Daily   Continuous Infusions: . remdesivir 100 mg in NS 100 mL 100 mg (02/19/19 0933)     LOS: 1 day    Time spent: 25 minutes    Edwin Dada, MD [facility] 02/19/2019, 10:37 AM     Please page though AMION or Epic  secure chat:  For password, contact charge nurse

## 2019-02-19 NOTE — Progress Notes (Signed)
Pt sitting up in bed eating lunch with assistance.

## 2019-02-19 DEATH — deceased

## 2019-02-20 DIAGNOSIS — J9621 Acute and chronic respiratory failure with hypoxia: Secondary | ICD-10-CM

## 2019-02-20 DIAGNOSIS — R410 Disorientation, unspecified: Secondary | ICD-10-CM

## 2019-02-20 LAB — CBC
HCT: 34.9 % — ABNORMAL LOW (ref 39.0–52.0)
Hemoglobin: 11 g/dL — ABNORMAL LOW (ref 13.0–17.0)
MCH: 21.9 pg — ABNORMAL LOW (ref 26.0–34.0)
MCHC: 31.5 g/dL (ref 30.0–36.0)
MCV: 69.5 fL — ABNORMAL LOW (ref 80.0–100.0)
Platelets: 259 10*3/uL (ref 150–400)
RBC: 5.02 MIL/uL (ref 4.22–5.81)
RDW: 21.6 % — ABNORMAL HIGH (ref 11.5–15.5)
WBC: 11.3 10*3/uL — ABNORMAL HIGH (ref 4.0–10.5)
nRBC: 0 % (ref 0.0–0.2)

## 2019-02-20 LAB — COMPREHENSIVE METABOLIC PANEL
ALT: 22 U/L (ref 0–44)
AST: 29 U/L (ref 15–41)
Albumin: 2.9 g/dL — ABNORMAL LOW (ref 3.5–5.0)
Alkaline Phosphatase: 53 U/L (ref 38–126)
Anion gap: 11 (ref 5–15)
BUN: 46 mg/dL — ABNORMAL HIGH (ref 8–23)
CO2: 29 mmol/L (ref 22–32)
Calcium: 8.6 mg/dL — ABNORMAL LOW (ref 8.9–10.3)
Chloride: 102 mmol/L (ref 98–111)
Creatinine, Ser: 1.43 mg/dL — ABNORMAL HIGH (ref 0.61–1.24)
GFR calc Af Amer: 58 mL/min — ABNORMAL LOW (ref >60)
GFR calc non Af Amer: 50 mL/min — ABNORMAL LOW (ref >60)
Glucose, Bld: 259 mg/dL — ABNORMAL HIGH (ref 70–99)
Potassium: 4.5 mmol/L (ref 3.5–5.1)
Sodium: 142 mmol/L (ref 135–145)
Total Bilirubin: 1 mg/dL (ref 0.3–1.2)
Total Protein: 6.6 g/dL (ref 6.5–8.1)

## 2019-02-20 LAB — GLUCOSE, CAPILLARY
Glucose-Capillary: 227 mg/dL — ABNORMAL HIGH (ref 70–99)
Glucose-Capillary: 186 mg/dL — ABNORMAL HIGH (ref 70–99)
Glucose-Capillary: 165 mg/dL — ABNORMAL HIGH (ref 70–99)
Glucose-Capillary: 185 mg/dL — ABNORMAL HIGH (ref 70–99)

## 2019-02-20 LAB — C-REACTIVE PROTEIN: CRP: 10.5 mg/dL — ABNORMAL HIGH (ref <1.0)

## 2019-02-20 LAB — D-DIMER, QUANTITATIVE: D-Dimer, Quant: 1.5 ug/mL-FEU — ABNORMAL HIGH (ref 0.00–0.50)

## 2019-02-20 MED ORDER — FERROUS SULFATE 325 (65 FE) MG PO TABS
325.0000 mg | ORAL_TABLET | Freq: Two times a day (BID) | ORAL | Status: DC
  Administered 2019-02-20 – 2019-02-22 (×5): 325 mg via ORAL
  Filled 2019-02-20 (×6): qty 1

## 2019-02-20 MED ORDER — ACETAMINOPHEN 325 MG PO TABS
650.0000 mg | ORAL_TABLET | Freq: Four times a day (QID) | ORAL | Status: DC | PRN
  Administered 2019-02-20 – 2019-02-23 (×4): 650 mg via ORAL
  Filled 2019-02-20 (×4): qty 2

## 2019-02-20 MED ORDER — QUETIAPINE FUMARATE 25 MG PO TABS
25.0000 mg | ORAL_TABLET | Freq: Two times a day (BID) | ORAL | Status: DC | PRN
  Administered 2019-02-20 – 2019-02-23 (×5): 25 mg via ORAL
  Filled 2019-02-20 (×5): qty 1

## 2019-02-20 MED ORDER — HALOPERIDOL LACTATE 5 MG/ML IJ SOLN
1.0000 mg | Freq: Once | INTRAMUSCULAR | Status: AC
  Administered 2019-02-20: 05:00:00 1 mg via INTRAVENOUS
  Filled 2019-02-20: qty 1

## 2019-02-20 MED ORDER — DIPHENHYDRAMINE HCL 12.5 MG/5ML PO ELIX
12.5000 mg | ORAL_SOLUTION | Freq: Once | ORAL | Status: AC
  Administered 2019-02-20: 03:00:00 12.5 mg via ORAL
  Filled 2019-02-20: qty 5

## 2019-02-20 MED ORDER — DOCUSATE SODIUM 100 MG PO CAPS
100.0000 mg | ORAL_CAPSULE | Freq: Every day | ORAL | Status: DC
  Administered 2019-02-20 – 2019-02-22 (×3): 100 mg via ORAL
  Filled 2019-02-20 (×4): qty 1

## 2019-02-20 MED ORDER — INSULIN GLARGINE 100 UNIT/ML ~~LOC~~ SOLN
8.0000 [IU] | Freq: Every day | SUBCUTANEOUS | Status: DC
  Administered 2019-02-20: 18:00:00 8 [IU] via SUBCUTANEOUS
  Filled 2019-02-20 (×2): qty 0.08

## 2019-02-20 MED ORDER — HYDROCOD POLST-CPM POLST ER 10-8 MG/5ML PO SUER
5.0000 mL | Freq: Two times a day (BID) | ORAL | Status: DC | PRN
  Administered 2019-02-20 – 2019-02-22 (×3): 5 mL via ORAL
  Filled 2019-02-20 (×3): qty 5

## 2019-02-20 NOTE — Progress Notes (Signed)
PROGRESS NOTE    Lance Nunez  [id number] DOB: 1950/06/14 DOA: 02/12/2019 PCP: Donnie Coffin, MD      Brief Narrative:  Lance Nunez is a 69 y.o. M with sCHF with AICD, CAD, CKD IIIa baseline 1.5, HTN, and dementia who presented with AICD discharge.  In the ER he was afebrile, HR and BP normal. Respirations 22 and 90% on room air.  CXR showed no disease.      Assessment & Plan:  Coronavirus pneumonitis Acute hypoxic respiratory failure Patient admitted with new O2 requirement 2L (Previous documentation erroneously stated the patient is on 2 L oxygen at baseline, he has not been for the last year).   Got Lasix on 1/1   Oxygen requirement still elevated at 3 to 4 L.  Inflammatory markers improving.  Transaminases stable.  Cr stable. No new fever.    -Continue remdesivir, day 3 of 5 -Continue dexamethasone, day 3 -Continue DVT prophylaxis with Lovenox -Continue zinc and vitamin C -Nutrition consult    Acute metabolic encephalopathy superimposed on dementia -Continue home Seroquel -Continue melatonin -Continue donepezil -Add PRN Seroquel 25 mg as needed for agitation  COPD  No wheezing -Continue Advair  Ischemic heart disease -Continue aspirin, atorvastatin, carvedilol  Diabetes Glucose somewhat elevated -Hold glipizide, Metformin -Continue sliding scale corrections   -Continue linagliptin -Start low dose Lantus  CKD IIIb Cr stable  Hyperkalemia resolved  Microcytic anemia Iron studies suggest low iron sat. -Continue oral iron and docusate -FOBT as outpatient and GI consultation pending goals of care   Chest discomfort Patient presented with chief complaint of "my ICD fired".  Unfortunately interrogation of his ICD showed that it had not in fact fired.  Cardiology were consulted, who recommended no further work-up, continuing his home cardiovascular meds. -Appreciate cardiology input       DVT prophylaxis: Lovenox Code Status:  Full code Family Communication: Daughter by phone  MDM and disposition Plan:  The below labs and imaging reports reviewed and summarized above.  Medication management as above.   The patient was admitted with confusion, found to have COVID-19 with acute hypoxic respiratory failure.  Remains on high levels of new supplemental oxygen.  Requires ongoing IV remdesivir and steroids.  He is still quite delirious.  His prognosis remains guarded.        Barriers to discharge: Clinical improvement, weaning O2  Also PT eval, likely SNF      Objective: Vitals:   02/19/19 1536 02/19/19 2053 02/20/19 0308 02/20/19 0825  BP: 105/62 (!) 141/88 (!) 153/90 (!) 153/82  Pulse: 64 88 74 76  Resp: 20 20 20  (!) 23  Temp: 97.8 F (36.6 C) 98 F (36.7 C) 97.8 F (36.6 C) 98.8 F (37.1 C)  TempSrc: Oral Oral Oral Oral  SpO2: 90% 94% 92% 92%  Weight:      Height:        Intake/Output Summary (Last 24 hours) at 02/20/2019 1626 Last data filed at 02/20/2019 0330 Gross per 24 hour  Intake -  Output 1000 ml  Net -1000 ml   Filed Weights   01/24/2019 0400 02/09/2019 1447  Weight: 93.4 kg 95.6 kg    Examination: General appearance: Elderly overweight adult male, awake but distressed, restless, incoherent, sitting in bed, gown mostly off, trying to climb out of bed.   HEENT: Anicteric, conjunctiva pink, lids and lashes normal. No nasal deformity, discharge, epistaxis.  Lips dry, oropharynx dry, does not open mouth further.  Hearing seems normal. Skin: Warm and  dry.  No suspicious rashes or lesions. Cardiac: Tachycardic, regular, no murmurs appreciated.  No LE edema.    Respiratory: Tachypneic, lung sounds diminished without rales or wheezes appreciated. Abdomen: Abdomen soft.  No tenderness to palpation or guarding. No ascites, distension, hepatosplenomegaly.   MSK: No deformities or effusions of the large joints of the upper or lower extremities bilaterally.  Diffuse loss of subcutaneous muscle mass  and fat Neuro: Awake but disoriented and agitated.  Does not follow commands, upper extremity strength seems to be normal,, normal tone, symmetric.  Speech fluent but not goal oriented. Psych: Inattentive, agitated.          Data Reviewed: I have personally reviewed following labs and imaging studies:  CBC: Recent Labs  Lab 02/10/2019 0243 01/30/2019 0410 02/19/19 0446 02/20/19 0604  WBC 4.2 4.0 7.9 11.3*  HGB 10.0* 9.4* 10.4* 11.0*  HCT 32.4* 29.6* 34.4* 34.9*  MCV 71.7* 71.3* 72.6* 69.5*  PLT 165 141* 199 [id number]   Basic Metabolic Panel: Recent Labs  Lab 02/13/2019 0243 02/08/2019 0410 02/04/2019 0411 02/19/19 0446 02/20/19 0604  NA 137  --  136 142 142  K 4.2  --  4.3 5.3* 4.5  CL 100  --  101 103 102  CO2 26  --  28 31 29   GLUCOSE 204*  --  196* 193* 259*  BUN 29*  --  29* 38* 46*  CREATININE 1.67*  --  1.60* 1.39* 1.43*  CALCIUM 8.2*  --  7.9* 8.4* 8.6*  MG  --  2.2  --   --   --    GFR: Estimated Creatinine Clearance: 53.5 mL/min (A) (by C-G formula based on SCr of 1.43 mg/dL (H)). Liver Function Tests: Recent Labs  Lab 02/19/19 0446 02/20/19 0604  AST 35 29  ALT 23 22  ALKPHOS 54 53  BILITOT 0.7 1.0  PROT 6.8 6.6  ALBUMIN 3.1* 2.9*   No results for input(s): LIPASE, AMYLASE in the last 168 hours. No results for input(s): AMMONIA in the last 168 hours. Coagulation Profile: No results for input(s): INR, PROTIME in the last 168 hours. Cardiac Enzymes: No results for input(s): CKTOTAL, CKMB, CKMBINDEX, TROPONINI in the last 168 hours. BNP (last 3 results) No results for input(s): PROBNP in the last 8760 hours. HbA1C: No results for input(s): HGBA1C in the last 72 hours. CBG: Recent Labs  Lab 02/19/19 1148 02/19/19 1536 02/19/19 2053 02/20/19 0823 02/20/19 1146  GLUCAP 189* 173* 170* 227* 186*   Lipid Profile: No results for input(s): CHOL, HDL, LDLCALC, TRIG, CHOLHDL, LDLDIRECT in the last 72 hours. Thyroid Function Tests: No results for input(s):  TSH, T4TOTAL, FREET4, T3FREE, THYROIDAB in the last 72 hours. Anemia Panel: Recent Labs    02/18/19 1255 02/19/19 0446  FERRITIN 67  --   TIBC  --  325  IRON  --  14*   Urine analysis:    Component Value Date/Time   COLORURINE YELLOW (A) 01/17/2018 1558   APPEARANCEUR CLEAR (A) 01/17/2018 1558   LABSPEC 1.018 01/17/2018 1558   PHURINE 5.0 01/17/2018 1558   GLUCOSEU NEGATIVE 01/17/2018 1558   HGBUR SMALL (A) 01/17/2018 1558   BILIRUBINUR NEGATIVE 01/17/2018 1558   KETONESUR NEGATIVE 01/17/2018 1558   PROTEINUR 100 (A) 01/17/2018 1558   NITRITE NEGATIVE 01/17/2018 1558   LEUKOCYTESUR NEGATIVE 01/17/2018 1558   Sepsis Labs: @LABRCNTIP (procalcitonin:4,lacticacidven:4)  ) Recent Results (from the past 240 hour(s))  Respiratory Panel by RT PCR (Flu A&B, Covid) - Nasopharyngeal Swab  Status: Abnormal   Collection Time: 02/14/2019  3:37 AM   Specimen: Nasopharyngeal Swab  Result Value Ref Range Status   SARS Coronavirus 2 by RT PCR POSITIVE (A) NEGATIVE Final    Comment: RESULT CALLED TO, READ BACK BY AND VERIFIED WITH: Dorian Furnace RN [phone] 02/04/2019 HNM (NOTE) SARS-CoV-2 target nucleic acids are DETECTED. SARS-CoV-2 RNA is generally detectable in upper respiratory specimens  during the acute phase of infection. Positive results are indicative of the presence of the identified virus, but do not rule out bacterial infection or co-infection with other pathogens not detected by the test. Clinical correlation with patient history and other diagnostic information is necessary to determine patient infection status. The expected result is Negative. Fact Sheet for Patients:  PinkCheek.be Fact Sheet for Healthcare Providers: GravelBags.it This test is not yet approved or cleared by the [place] FDA and  has been authorized for detection and/or diagnosis of SARS-CoV-2 by FDA under an Emergency Use Authorization (EUA).   This EUA will remain in effect (meaning this test can be used)  for the duration of  the COVID-19 declaration under Section 564(b)(1) of the Act, 21 U.S.C. section 360bbb-3(b)(1), unless the authorization is terminated or revoked sooner.    Influenza A by PCR NEGATIVE NEGATIVE Final   Influenza B by PCR NEGATIVE NEGATIVE Final    Comment: (NOTE) The Xpert Xpress SARS-CoV-2/FLU/RSV assay is intended as an aid in  the diagnosis of influenza from Nasopharyngeal swab specimens and  should not be used as a sole basis for treatment. Nasal washings and  aspirates are unacceptable for Xpert Xpress SARS-CoV-2/FLU/RSV  testing. Fact Sheet for Patients: PinkCheek.be Fact Sheet for Healthcare Providers: GravelBags.it This test is not yet approved or cleared by the [place] FDA and  has been authorized for detection and/or diagnosis of SARS-CoV-2 by  FDA under an Emergency Use Authorization (EUA). This EUA will remain  in effect (meaning this test can be used) for the duration of the  Covid-19 declaration under Section 564(b)(1) of the Act, 21  U.S.C. section 360bbb-3(b)(1), unless the authorization is  terminated or revoked. Performed at [facility], [address] [place], [place] [address]          Radiology Studies: No results found.      Scheduled Meds: . vitamin C  500 mg Oral Daily  . aspirin EC  81 mg Oral Daily  . atorvastatin  40 mg Oral q1800  . carvedilol  12.5 mg Oral BID WC  . Chlorhexidine Gluconate Cloth  6 each Topical Daily  . dexamethasone (DECADRON) injection  6 mg Intravenous Q12H  . donepezil  5 mg Oral QHS  . enoxaparin (LOVENOX) injection  40 mg Subcutaneous Q24H  . feeding supplement (ENSURE ENLIVE)  237 mL Oral BID BM  . insulin aspart  0-20 Units Subcutaneous TID WC  . insulin aspart  0-5 Units Subcutaneous QHS  . linagliptin  5 mg Oral Daily  . Melatonin  2.5 mg Oral QHS  .  mometasone-formoterol  2 puff Inhalation BID  . QUEtiapine  100 mg Oral QHS  . sertraline  50 mg Oral Daily  . zinc sulfate  220 mg Oral Daily   Continuous Infusions: . remdesivir 100 mg in NS 100 mL 100 mg (02/20/19 1033)     LOS: 2 days    Time spent: 35 minutes    Edwin Dada, MD [facility] 02/20/2019, 4:26 PM     Please page though [place] or Epic  secure chat:  For password, contact charge nurse

## 2019-02-20 NOTE — Progress Notes (Signed)
Pt has spent most of the night loudly yelling. Mostly he calls for help over and over. Pt unable to verbalize what specifically he needs help with. Pt will stop yelling if staff is in the room but resumes after they have gone. Pt given HS Seroquel and melatonin with minimal effect. NP made aware, pt yelling that he" cant sleep, please help me". PO  benadryl elixir 12.5 given. Pt seemed to rest for one hour before resuming yelling, this time that he can't see. Pt refuses to verbalize what he means by this. NP made aware, 1 mg IV haldol given. Pt still calling out for help 40 minutes later, however not as frequently. Pt still refusing to specify what he wants or needs.

## 2019-02-21 LAB — COMPREHENSIVE METABOLIC PANEL
ALT: 20 U/L (ref 0–44)
AST: 22 U/L (ref 15–41)
Albumin: 3.1 g/dL — ABNORMAL LOW (ref 3.5–5.0)
Alkaline Phosphatase: 55 U/L (ref 38–126)
Anion gap: 11 (ref 5–15)
BUN: 55 mg/dL — ABNORMAL HIGH (ref 8–23)
CO2: 28 mmol/L (ref 22–32)
Calcium: 8.5 mg/dL — ABNORMAL LOW (ref 8.9–10.3)
Chloride: 104 mmol/L (ref 98–111)
Creatinine, Ser: 1.51 mg/dL — ABNORMAL HIGH (ref 0.61–1.24)
GFR calc Af Amer: 54 mL/min — ABNORMAL LOW (ref >60)
GFR calc non Af Amer: 47 mL/min — ABNORMAL LOW (ref >60)
Glucose, Bld: 219 mg/dL — ABNORMAL HIGH (ref 70–99)
Potassium: 4.8 mmol/L (ref 3.5–5.1)
Sodium: 143 mmol/L (ref 135–145)
Total Bilirubin: 0.8 mg/dL (ref 0.3–1.2)
Total Protein: 6.7 g/dL (ref 6.5–8.1)

## 2019-02-21 LAB — GLUCOSE, CAPILLARY
Glucose-Capillary: 205 mg/dL — ABNORMAL HIGH (ref 70–99)
Glucose-Capillary: 196 mg/dL — ABNORMAL HIGH (ref 70–99)
Glucose-Capillary: 177 mg/dL — ABNORMAL HIGH (ref 70–99)
Glucose-Capillary: 207 mg/dL — ABNORMAL HIGH (ref 70–99)

## 2019-02-21 LAB — CBC
HCT: 39.7 % (ref 39.0–52.0)
Hemoglobin: 11.6 g/dL — ABNORMAL LOW (ref 13.0–17.0)
MCH: 21.6 pg — ABNORMAL LOW (ref 26.0–34.0)
MCHC: 29.2 g/dL — ABNORMAL LOW (ref 30.0–36.0)
MCV: 74.1 fL — ABNORMAL LOW (ref 80.0–100.0)
Platelets: 317 10*3/uL (ref 150–400)
RBC: 5.36 MIL/uL (ref 4.22–5.81)
RDW: 22.1 % — ABNORMAL HIGH (ref 11.5–15.5)
WBC: 12.6 10*3/uL — ABNORMAL HIGH (ref 4.0–10.5)
nRBC: 0 % (ref 0.0–0.2)

## 2019-02-21 LAB — C-REACTIVE PROTEIN: CRP: 5.6 mg/dL — ABNORMAL HIGH (ref <1.0)

## 2019-02-21 LAB — D-DIMER, QUANTITATIVE: D-Dimer, Quant: 1.7 ug/mL-FEU — ABNORMAL HIGH (ref 0.00–0.50)

## 2019-02-21 MED ORDER — SODIUM CHLORIDE 0.9 % IV BOLUS
500.0000 mL | Freq: Once | INTRAVENOUS | Status: AC
  Administered 2019-02-21: 08:00:00 500 mL via INTRAVENOUS

## 2019-02-21 MED ORDER — INSULIN GLARGINE 100 UNIT/ML ~~LOC~~ SOLN
12.0000 [IU] | Freq: Every day | SUBCUTANEOUS | Status: DC
  Administered 2019-02-22: 19:00:00 12 [IU] via SUBCUTANEOUS
  Filled 2019-02-21 (×2): qty 0.12

## 2019-02-21 MED ORDER — LORAZEPAM 0.5 MG PO TABS
0.5000 mg | ORAL_TABLET | Freq: Four times a day (QID) | ORAL | Status: DC | PRN
  Administered 2019-02-21 – 2019-02-22 (×2): 0.5 mg via ORAL
  Filled 2019-02-21 (×2): qty 1

## 2019-02-21 NOTE — Plan of Care (Signed)
  Problem: Clinical Measurements: Goal: Respiratory complications will improve Outcome: Progressing Goal: Cardiovascular complication will be avoided Outcome: Progressing   Problem: Coping: Goal: Level of anxiety will decrease Outcome: Progressing   Problem: Elimination: Goal: Will not experience complications related to urinary retention Outcome: Progressing   

## 2019-02-21 NOTE — Progress Notes (Signed)
PROGRESS NOTE    Lance Nunez  [id number] DOB: 09-Jan-1951 DOA: 02/03/2019 PCP: Donnie Coffin, MD      Brief Narrative:  Mr. Lance Nunez is a 69 y.o. M with sCHF with AICD, CAD, CKD IIIa baseline 1.5, HTN, and dementia who presented with AICD discharge.  In the ER he was afebrile, HR and BP normal. Respirations 22 and 90% on room air.  CXR showed no disease.         Assessment & Plan:  Coronavirus pneumonitis Acute hypoxic respiratory failure Patient admitted with new O2 requirement 2L (Previous documentation erroneously stated the patient is on 2 L oxygen at baseline, he has not been for the last year).   Got Lasix on 1/1   O2 requirement at 3L still.  Inflammatory markers improved, transaminases stable, Cr stable.  No fever.    -Continue remdesivir, day 4 of 5 -Continue dexamethasone, day 4 -Continue DVT prophylaxis with Lovenox -Continue zinc and vitamin C -Nutrition consult    Acute metabolic encephalopathy superimposed on dementia Patient with severe persistent encephalopathy. Seems anxious.  No improvement with pain control, cough suppressant, low dose Seroquel. -Continue home Seroquel -Continue melatonin -Continue donepezil -Continue PRN Seroquel 25 mg as needed for agitation -Trial lorazepam PO low dose  COPD  No wheezing -Continue Advair  Ischemic heart disease -Continue aspirin, atorvastatin, carvedilol  Diabetes Glucose still high -Hold glipizide, Metformin -Continue sliding scale corrections   -Continue linagliptin -Continue new Lantus  CKD IIIb Cr stable but trending up  Hyperkalemia Normalized  Microcytic anemia Iron studies suggest low iron sat. -Continue oral iron and docusate -FOBT as outpatient and GI consultation pending goals of care   Chest discomfort Patient presented with chief complaint of "my ICD fired".  Unfortunately interrogation of his ICD showed that it had not in fact fired.  Cardiology were consulted,  who recommended no further work-up, continuing his home cardiovascular meds. -Appreciate cardiology input       DVT prophylaxis: Lovenox Code Status: Full code Family Communication: son in law by phone  MDM and disposition Plan:  The below labs and imaging reports reviewed and summarized above.  Medication management as above.   The patient was admitted with confusion, found to have COVID-19 with acute hypoxic respiratory failure.  Remains on high levels of new supplemental oxygen.  Requires ongoing IV remdesivir and steroids.  He is still quite delirious.  His prognosis remains guarded.        Barriers to discharge: Clinical improvement, weaning O2  Also PT eval, likely SNF      Objective: Vitals:   02/20/19 2032 02/21/19 0453 02/21/19 0801 02/21/19 1530  BP: (!) 153/102 (!) 185/95 (!) 130/101 (!) 160/108  Pulse: 80 67 96 99  Resp: 20 18 18 18   Temp: 98.6 F (37 C) (!) 97.4 F (36.3 C) 98.5 F (36.9 C) 97.9 F (36.6 C)  TempSrc: Axillary Axillary Axillary Axillary  SpO2: 92% 96% 92% 92%  Weight:  87.1 kg    Height:        Intake/Output Summary (Last 24 hours) at 02/21/2019 1703 Last data filed at 02/21/2019 1534 Gross per 24 hour  Intake 316.97 ml  Output 1200 ml  Net -883.03 ml   Filed Weights   02/09/2019 0400 02/07/2019 1447 02/21/19 0453  Weight: 93.4 kg 95.6 kg 87.1 kg    Examination: General appearance: Overweight elderly adult male, alert and in appears agitated, and in mild distress.   HEENT: Anicteric, conjunctiva pale, lids and lashes  normal. No nasal deformity, discharge, epistaxis.  Lips dry OP dry, no oral lesions.  Hearing diminished. Skin: Warm and dry.  No suspicious rashes or lesions. Cardiac: Tachycardic, regular, no murmurs appreciated.  No LE edema.    Respiratory: Tachypneic, lung sounds diminished, no rales or wheezes. Abdomen: Abdomen soft.  No tenderness palpation or guarding. No ascites, distension, hepatosplenomegaly.   MSK: No  deformities or effusions of the large joints of the upper or lower extremities bilaterally. Neuro: Awake but agitated and restless, calling out, not oriented to self, place, or situation.  Does not consistently follow commands or make attempt to engage in interview or discussion.   Moves all extremities with generalized weakness, symmetric strength. Psych: Agitated, attention diminished, judgment insight appear severely impaired            Data Reviewed: I have personally reviewed following labs and imaging studies:  CBC: Recent Labs  Lab 02/06/2019 0243 01/23/2019 0410 02/19/19 0446 02/20/19 0604 02/21/19 0558  WBC 4.2 4.0 7.9 11.3* 12.6*  HGB 10.0* 9.4* 10.4* 11.0* 11.6*  HCT 32.4* 29.6* 34.4* 34.9* 39.7  MCV 71.7* 71.3* 72.6* 69.5* 74.1*  PLT 165 141* 199 259 [id number]   Basic Metabolic Panel: Recent Labs  Lab 02/02/2019 0243 02/04/2019 0410 02/18/2019 0411 02/19/19 0446 02/20/19 0604 02/21/19 0558  NA 137  --  136 142 142 143  K 4.2  --  4.3 5.3* 4.5 4.8  CL 100  --  101 103 102 104  CO2 26  --  28 31 29 28   GLUCOSE 204*  --  196* 193* 259* 219*  BUN 29*  --  29* 38* 46* 55*  CREATININE 1.67*  --  1.60* 1.39* 1.43* 1.51*  CALCIUM 8.2*  --  7.9* 8.4* 8.6* 8.5*  MG  --  2.2  --   --   --   --    GFR: Estimated Creatinine Clearance: 48.4 mL/min (A) (by C-G formula based on SCr of 1.51 mg/dL (H)). Liver Function Tests: Recent Labs  Lab 02/19/19 0446 02/20/19 0604 02/21/19 0558  AST 35 29 22  ALT 23 22 20   ALKPHOS 54 53 55  BILITOT 0.7 1.0 0.8  PROT 6.8 6.6 6.7  ALBUMIN 3.1* 2.9* 3.1*   No results for input(s): LIPASE, AMYLASE in the last 168 hours. No results for input(s): AMMONIA in the last 168 hours. Coagulation Profile: No results for input(s): INR, PROTIME in the last 168 hours. Cardiac Enzymes: No results for input(s): CKTOTAL, CKMB, CKMBINDEX, TROPONINI in the last 168 hours. BNP (last 3 results) No results for input(s): PROBNP in the last 8760 hours.  HbA1C: No results for input(s): HGBA1C in the last 72 hours. CBG: Recent Labs  Lab 02/20/19 1738 02/20/19 2034 02/21/19 0759 02/21/19 1126 02/21/19 1628  GLUCAP 165* 185* 205* 196* 177*   Lipid Profile: No results for input(s): CHOL, HDL, LDLCALC, TRIG, CHOLHDL, LDLDIRECT in the last 72 hours. Thyroid Function Tests: No results for input(s): TSH, T4TOTAL, FREET4, T3FREE, THYROIDAB in the last 72 hours. Anemia Panel: Recent Labs    02/19/19 0446  TIBC 325  IRON 14*   Urine analysis:    Component Value Date/Time   COLORURINE YELLOW (A) 01/17/2018 1558   APPEARANCEUR CLEAR (A) 01/17/2018 1558   LABSPEC 1.018 01/17/2018 1558   PHURINE 5.0 01/17/2018 1558   GLUCOSEU NEGATIVE 01/17/2018 1558   HGBUR SMALL (A) 01/17/2018 1558   BILIRUBINUR NEGATIVE 01/17/2018 1558   KETONESUR NEGATIVE 01/17/2018 1558   PROTEINUR 100 (A)  01/17/2018 1558   NITRITE NEGATIVE 01/17/2018 1558   LEUKOCYTESUR NEGATIVE 01/17/2018 1558   Sepsis Labs: @LABRCNTIP (procalcitonin:4,lacticacidven:4)  ) Recent Results (from the past 240 hour(s))  Respiratory Panel by RT PCR (Flu A&B, Covid) - Nasopharyngeal Swab     Status: Abnormal   Collection Time: 01/30/2019  3:37 AM   Specimen: Nasopharyngeal Swab  Result Value Ref Range Status   SARS Coronavirus 2 by RT PCR POSITIVE (A) NEGATIVE Final    Comment: RESULT CALLED TO, READ BACK BY AND VERIFIED WITH: Dorian Furnace RN [phone] 02/11/2019 HNM (NOTE) SARS-CoV-2 target nucleic acids are DETECTED. SARS-CoV-2 RNA is generally detectable in upper respiratory specimens  during the acute phase of infection. Positive results are indicative of the presence of the identified virus, but do not rule out bacterial infection or co-infection with other pathogens not detected by the test. Clinical correlation with patient history and other diagnostic information is necessary to determine patient infection status. The expected result is Negative. Fact Sheet for  Patients:  PinkCheek.be Fact Sheet for Healthcare Providers: GravelBags.it This test is not yet approved or cleared by the [place] FDA and  has been authorized for detection and/or diagnosis of SARS-CoV-2 by FDA under an Emergency Use Authorization (EUA).  This EUA will remain in effect (meaning this test can be used)  for the duration of  the COVID-19 declaration under Section 564(b)(1) of the Act, 21 U.S.C. section 360bbb-3(b)(1), unless the authorization is terminated or revoked sooner.    Influenza A by PCR NEGATIVE NEGATIVE Final   Influenza B by PCR NEGATIVE NEGATIVE Final    Comment: (NOTE) The Xpert Xpress SARS-CoV-2/FLU/RSV assay is intended as an aid in  the diagnosis of influenza from Nasopharyngeal swab specimens and  should not be used as a sole basis for treatment. Nasal washings and  aspirates are unacceptable for Xpert Xpress SARS-CoV-2/FLU/RSV  testing. Fact Sheet for Patients: PinkCheek.be Fact Sheet for Healthcare Providers: GravelBags.it This test is not yet approved or cleared by the [place] FDA and  has been authorized for detection and/or diagnosis of SARS-CoV-2 by  FDA under an Emergency Use Authorization (EUA). This EUA will remain  in effect (meaning this test can be used) for the duration of the  Covid-19 declaration under Section 564(b)(1) of the Act, 21  U.S.C. section 360bbb-3(b)(1), unless the authorization is  terminated or revoked. Performed at [facility], [address], [place], [place] [address]          Radiology Studies: No results found.      Scheduled Meds: . vitamin C  500 mg Oral Daily  . aspirin EC  81 mg Oral Daily  . atorvastatin  40 mg Oral q1800  . carvedilol  12.5 mg Oral BID WC  . Chlorhexidine Gluconate Cloth  6 each Topical Daily  . dexamethasone (DECADRON) injection  6 mg  Intravenous Q12H  . docusate sodium  100 mg Oral Daily  . donepezil  5 mg Oral QHS  . enoxaparin (LOVENOX) injection  40 mg Subcutaneous Q24H  . feeding supplement (ENSURE ENLIVE)  237 mL Oral BID BM  . ferrous sulfate  325 mg Oral BID WC  . insulin aspart  0-20 Units Subcutaneous TID WC  . insulin aspart  0-5 Units Subcutaneous QHS  . insulin glargine  8 Units Subcutaneous Daily  . linagliptin  5 mg Oral Daily  . Melatonin  2.5 mg Oral QHS  . mometasone-formoterol  2 puff Inhalation BID  . QUEtiapine  100  mg Oral QHS  . sertraline  50 mg Oral Daily  . zinc sulfate  220 mg Oral Daily   Continuous Infusions: . remdesivir 100 mg in NS 100 mL Stopped (02/21/19 1043)     LOS: 3 days    Time spent: 35 minutes    Edwin Dada, MD [facility] 02/21/2019, 5:03 PM     Please page though [place] or Epic secure chat:  For password, contact charge nurse

## 2019-02-22 ENCOUNTER — Inpatient Hospital Stay: Payer: Medicare Other

## 2019-02-22 LAB — COMPREHENSIVE METABOLIC PANEL
ALT: 20 U/L (ref 0–44)
AST: 19 U/L (ref 15–41)
Albumin: 3.3 g/dL — ABNORMAL LOW (ref 3.5–5.0)
Alkaline Phosphatase: 51 U/L (ref 38–126)
Anion gap: 12 (ref 5–15)
BUN: 72 mg/dL — ABNORMAL HIGH (ref 8–23)
CO2: 28 mmol/L (ref 22–32)
Calcium: 8.7 mg/dL — ABNORMAL LOW (ref 8.9–10.3)
Chloride: 107 mmol/L (ref 98–111)
Creatinine, Ser: 1.8 mg/dL — ABNORMAL HIGH (ref 0.61–1.24)
GFR calc Af Amer: 44 mL/min — ABNORMAL LOW (ref >60)
GFR calc non Af Amer: 38 mL/min — ABNORMAL LOW (ref >60)
Glucose, Bld: 218 mg/dL — ABNORMAL HIGH (ref 70–99)
Potassium: 4.9 mmol/L (ref 3.5–5.1)
Sodium: 147 mmol/L — ABNORMAL HIGH (ref 135–145)
Total Bilirubin: 0.9 mg/dL (ref 0.3–1.2)
Total Protein: 6.5 g/dL (ref 6.5–8.1)

## 2019-02-22 LAB — D-DIMER, QUANTITATIVE: D-Dimer, Quant: 1.75 ug/mL-FEU — ABNORMAL HIGH (ref 0.00–0.50)

## 2019-02-22 LAB — CBC
HCT: 36.5 % — ABNORMAL LOW (ref 39.0–52.0)
Hemoglobin: 11.3 g/dL — ABNORMAL LOW (ref 13.0–17.0)
MCH: 21.9 pg — ABNORMAL LOW (ref 26.0–34.0)
MCHC: 31 g/dL (ref 30.0–36.0)
MCV: 70.6 fL — ABNORMAL LOW (ref 80.0–100.0)
Platelets: 327 10*3/uL (ref 150–400)
RBC: 5.17 MIL/uL (ref 4.22–5.81)
RDW: 22.3 % — ABNORMAL HIGH (ref 11.5–15.5)
WBC: 10.2 10*3/uL (ref 4.0–10.5)
nRBC: 0.2 % (ref 0.0–0.2)

## 2019-02-22 LAB — GLUCOSE, CAPILLARY
Glucose-Capillary: 185 mg/dL — ABNORMAL HIGH (ref 70–99)
Glucose-Capillary: 241 mg/dL — ABNORMAL HIGH (ref 70–99)
Glucose-Capillary: 116 mg/dL — ABNORMAL HIGH (ref 70–99)
Glucose-Capillary: 169 mg/dL — ABNORMAL HIGH (ref 70–99)

## 2019-02-22 LAB — C-REACTIVE PROTEIN: CRP: 3.3 mg/dL — ABNORMAL HIGH (ref <1.0)

## 2019-02-22 MED ORDER — SODIUM CHLORIDE 0.9 % IV SOLN: INTRAVENOUS | Status: DC

## 2019-02-22 MED ORDER — HALOPERIDOL LACTATE 5 MG/ML IJ SOLN: 5.0000 mg | Freq: Once | INTRAMUSCULAR | Status: DC

## 2019-02-22 MED ORDER — IPRATROPIUM BROMIDE HFA 17 MCG/ACT IN AERS
2.0000 | INHALATION_SPRAY | RESPIRATORY_TRACT | Status: DC
  Administered 2019-02-23 (×2): 2 via RESPIRATORY_TRACT
  Filled 2019-02-22: qty 12.9

## 2019-02-22 MED ORDER — SODIUM CHLORIDE 0.9 % IV BOLUS
1000.0000 mL | Freq: Once | INTRAVENOUS | Status: AC
  Administered 2019-02-22: 19:00:00 1000 mL via INTRAVENOUS

## 2019-02-22 MED ORDER — FUROSEMIDE 10 MG/ML IJ SOLN
60.0000 mg | Freq: Once | INTRAMUSCULAR | Status: AC
  Administered 2019-02-22: 60 mg via INTRAVENOUS
  Filled 2019-02-22: qty 6

## 2019-02-22 MED ORDER — HALOPERIDOL LACTATE 5 MG/ML IJ SOLN
2.0000 mg | Freq: Once | INTRAMUSCULAR | Status: AC
  Administered 2019-02-22: 14:00:00 2 mg via INTRAVENOUS
  Filled 2019-02-22: qty 1

## 2019-02-22 NOTE — TOC Initial Note (Signed)
Transition of Care [facility]) - Initial/Assessment Note    Patient Details  Name: Lance Nunez MRN: [id number] Date of Birth: Sep 19, 1950  Transition of Care [facility]) CM/SW Contact:    Candie Chroman, LCSW Phone Number: 02/22/2019, 4:34 PM  Clinical Narrative: Patient only oriented to self. Called daughter, introduced role, and explained that PT recommendations would be discussed. Patient's daughter willing to consider SNF placement. She will discuss with the family. He has been to a SNF in the past and it did not go well due to dementia. Patient lives home with daughter and her family. He is usually pretty independent at home although they do not let him cook. Emailed daughter link to CMS scores and list of facilities that are taking COVID positive patients. No further concerns. CSW encouraged patient's daughter to contact CSW as needed. CSW will continue to follow patient and his daughter for support and facilitate discharge to SNF, if agreeable, once medically stable.                 Expected Discharge Plan: Skilled Nursing Facility Barriers to Discharge: Insurance Authorization, Continued Medical Work up   Patient Goals and CMS Choice Patient states their goals for this hospitalization and ongoing recovery are:: Patient not fully oriented. CMS Medicare.gov Compare Post Acute Care list provided to:: Patient Represenative (must comment)(Daughter)    Expected Discharge Plan and Services Expected Discharge Plan: [place] Choice: [place] arrangements for the past 2 months: Single Family Home Expected Discharge Date: 02/18/19                                    Prior Living Arrangements/Services Living arrangements for the past 2 months: Single Family Home Lives with:: Adult Children, Relatives Patient language and need for interpreter reviewed:: Yes Do you feel safe going back to the place where you live?: Yes       Need for Family Participation in Patient Care: Yes (Comment) Care giver support system in place?: Yes (comment)   Criminal Activity/Legal Involvement Pertinent to Current Situation/Hospitalization: No - Comment as needed  Activities of Daily Living      Permission Sought/Granted Permission sought to share information with : Facility Sport and exercise psychologist, Family Supports    Share Information with NAME: Ancil Linsey  Permission granted to share info w AGENCY: SNF's  Permission granted to share info w Relationship: Daughter  Permission granted to share info w Contact Information: [phone]  Emotional Assessment Appearance:: Appears stated age Attitude/Demeanor/Rapport: Unable to Assess Affect (typically observed): Unable to Assess Orientation: : Oriented to Self Alcohol / Substance Use: Not Applicable Psych Involvement: No (comment)  Admission diagnosis:  Elevated troponin [R77.8] Defibrillator discharge [Z45.02] AKI (acute kidney injury) ([place]) [N17.9] AICD discharge [Z45.02] Anemia, unspecified type [D64.9] Chest pain, unspecified type [R07.9] COVID-19 [U07.1] Patient Active Problem List   Diagnosis Date Noted  . COVID-19 02/18/2019  . Defibrillator discharge 02/14/2019  . Dementia without behavioral disturbance ([place]) 01/27/2019  . Encounter for screening colonoscopy   . Polyp of colon   . Rectal polyp   . Obstructive sleep apnea 01/17/2018  . Dyslipidemia associated with type 2 diabetes mellitus ([place]) 01/17/2018  . Delusion ([place]) 01/17/2018  . DM (diabetes mellitus), type 2 with renal complications ([place]) [id number]  . Vascular dementia with behavior disturbance ([place]) 01/15/2018  . COPD exacerbation ([place]) 01/02/2018  . CAD (coronary  artery disease) 01/01/2018  . COPD with acute exacerbation ([place]) 01/01/2018  . Chronic systolic CHF (congestive heart failure) ([place]) 01/01/2018  . Acute respiratory failure with hypoxemia ([place]) 08/18/2017  . Chest pain 08/17/2017   . 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 Dilated cardiomyopathy ([place]) 09/10/2015  . Diabetes mellitus without complication ([place]) [id number]  . HTN, goal below 140/80 09/10/2015   PCP:  Donnie Coffin, MD Pharmacy:   [facility], [place] [address] Phone: [phone] Fax: [place], [place] [address] [place] [address] Phone: [phone] Fax: [place] [id number] [place], [place] [address] [address] Phone: [phone] Fax: [phone]     Social Determinants of Health (SDOH) Interventions    Readmission Risk Interventions No flowsheet data found.

## 2019-02-22 NOTE — NC FL2 (Signed)
[place] LEVEL OF CARE SCREENING TOOL     IDENTIFICATION  Patient Name: Lance Nunez Birthdate: 1950-04-06 Sex: male Admission Date (Current Location): 02/01/2019  [place] and [place] Number:  Engineering geologist and Address:  [facility], [address], [place], [place] [address]      Provider Number: [id number]  Attending Physician Name and Address:  Edwin Dada, *  Relative Name and Phone Number:       Current Level of Care: Hospital Recommended Level of Care: [place] [employer] Prior Approval Number:    Date Approved/Denied:   PASRR Number: [id number]  Discharge Plan: SNF    Current Diagnoses: Patient Active Problem List   Diagnosis Date Noted  . COVID-19 02/18/2019  . Defibrillator discharge 02/02/2019  . Dementia without behavioral disturbance ([place]) 02/13/2019  . Encounter for screening colonoscopy   . Polyp of colon   . Rectal polyp   . Obstructive sleep apnea 01/17/2018  . Dyslipidemia associated with type 2 diabetes mellitus ([place]) 01/17/2018  . Delusion ([place]) 01/17/2018  . DM (diabetes mellitus), type 2 with renal complications ([place]) [id number]  . Vascular dementia with behavior disturbance ([place]) 01/15/2018  . COPD exacerbation ([place]) 01/02/2018  . CAD (coronary artery disease) 01/01/2018  . COPD with acute exacerbation ([place]) 01/01/2018  . Chronic systolic CHF (congestive heart failure) ([place]) 01/01/2018  . Acute respiratory failure with hypoxemia ([place]) 08/18/2017  . Chest pain 08/17/2017  . 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 Dilated cardiomyopathy ([place]) 09/10/2015  . Diabetes mellitus without complication ([place]) [id number]  . HTN, goal below 140/80 09/10/2015    Orientation RESPIRATION BLADDER Height & Weight     Self  O2(Nasal Canula 4 L) Incontinent, Indwelling catheter Weight: 192 lb 0.3 oz (87.1 kg) Height:   5\' 6"  (167.6 cm)  BEHAVIORAL SYMPTOMS/MOOD NEUROLOGICAL BOWEL NUTRITION STATUS  (None) (Dementia) Incontinent Diet(Regular)  AMBULATORY STATUS COMMUNICATION OF NEEDS Skin   Extensive Assist Verbally Other (Comment)(MASD.)                       Personal Care Assistance Level of Assistance  Bathing, Feeding, Dressing Bathing Assistance: Maximum assistance Feeding assistance: Maximum assistance Dressing Assistance: Maximum assistance     Functional Limitations Info  Sight, Hearing, Speech Sight Info: Adequate Hearing Info: Adequate Speech Info: Adequate    SPECIAL CARE FACTORS FREQUENCY  PT (By licensed PT), OT (By licensed OT)     PT Frequency: 5 x week OT Frequency: 5 x week            Contractures Contractures Info: Not present    Additional Factors Info  Code Status, Allergies, Isolation Precautions Code Status Info: Full code Allergies Info: Prednisone, Axid (Nizatidine), Codeine     Isolation Precautions Info: Airborne/Contact: COVID +     Current Medications (02/22/2019):  This is the current hospital active medication list Current Facility-Administered Medications  Medication Dose Route Frequency Provider Last Rate Last Admin  . 0.9 %  sodium chloride infusion   Intravenous Continuous Edwin Dada, MD 100 mL/hr at 02/22/19 0822 New Bag at 02/22/19 [id number]  . acetaminophen (TYLENOL) tablet 650 mg  650 mg Oral Q6H PRN Edwin Dada, MD   650 mg at 02/21/19 1158  . ascorbic acid (VITAMIN C) tablet 500 mg  500 mg Oral Daily Edwin Dada, MD   500 mg at 02/22/19 0827  . aspirin EC tablet 81 mg  81 mg Oral Daily Edwin Dada, MD   81 mg at 02/22/19 0826  . atorvastatin (LIPITOR) tablet 40 mg  40 mg Oral q1800 Edwin Dada, MD   40 mg at 02/21/19 2025  . carvedilol (COREG) tablet 12.5 mg  12.5 mg Oral BID WC Edwin Dada, MD   12.5 mg at 02/22/19 0826  . Chlorhexidine Gluconate Cloth 2 % PADS 6 each  6 each  Topical Daily Danford, Suann Larry, MD   6 each at 02/20/19 0830  . chlorpheniramine-HYDROcodone (TUSSIONEX) 10-8 MG/5ML suspension 5 mL  5 mL Oral Q12H PRN Edwin Dada, MD   5 mL at 02/22/19 0826  . dexamethasone (DECADRON) injection 6 mg  6 mg Intravenous Q12H Danford, Suann Larry, MD   6 mg at 02/22/19 0826  . docusate sodium (COLACE) capsule 100 mg  100 mg Oral Daily Edwin Dada, MD   100 mg at 02/22/19 0826  . donepezil (ARICEPT) tablet 5 mg  5 mg Oral QHS Danford, Suann Larry, MD   5 mg at 02/21/19 2025  . enoxaparin (LOVENOX) injection 40 mg  40 mg Subcutaneous Q24H Edwin Dada, MD   40 mg at 02/22/19 0826  . feeding supplement (ENSURE ENLIVE) (ENSURE ENLIVE) liquid 237 mL  237 mL Oral BID BM Danford, Suann Larry, MD   237 mL at 02/22/19 1426  . ferrous sulfate tablet 325 mg  325 mg Oral BID WC Edwin Dada, MD   325 mg at 02/22/19 0827  . insulin aspart (novoLOG) injection 0-20 Units  0-20 Units Subcutaneous TID WC Danford, Suann Larry, MD   7 Units at 02/22/19 1149  . insulin aspart (novoLOG) injection 0-5 Units  0-5 Units Subcutaneous QHS Edwin Dada, MD   2 Units at 02/21/19 2033  . insulin glargine (LANTUS) injection 12 Units  12 Units Subcutaneous Daily Danford, Suann Larry, MD      . linagliptin (TRADJENTA) tablet 5 mg  5 mg Oral Daily Danford, Suann Larry, MD   5 mg at 02/22/19 0826  . LORazepam (ATIVAN) tablet 0.5 mg  0.5 mg Oral Q6H PRN Edwin Dada, MD   0.5 mg at 02/22/19 0827  . Melatonin TABS 2.5 mg  2.5 mg Oral QHS Edwin Dada, MD   2.5 mg at 02/21/19 2025  . mometasone-formoterol (DULERA) 200-5 MCG/ACT inhaler 2 puff  2 puff Inhalation BID Edwin Dada, MD   2 puff at 02/22/19 [phone]  . QUEtiapine (SEROQUEL) tablet 100 mg  100 mg Oral QHS Edwin Dada, MD   100 mg at 02/21/19 2024  . QUEtiapine (SEROQUEL) tablet 25 mg  25 mg Oral BID PRN Edwin Dada, MD   25 mg  at 02/22/19 1051  . sertraline (ZOLOFT) tablet 50 mg  50 mg Oral Daily Edwin Dada, MD   50 mg at 02/22/19 0827  . zinc sulfate capsule 220 mg  220 mg Oral Daily Edwin Dada, MD   220 mg at 02/22/19 [id number]     Discharge Medications: Please see discharge summary for a list of discharge medications.  Relevant Imaging Results:  Relevant Lab Results:   Additional Information SS#: [id number]  Candie Chroman, LCSW

## 2019-02-22 NOTE — Progress Notes (Signed)
Inpatient Diabetes Program Recommendations  AACE/ADA: New Consensus Statement on Inpatient Glycemic Control (2015)  Target Ranges:  Prepandial:   less than 140 mg/dL      Peak postprandial:   less than 180 mg/dL (1-2 hours)      Critically ill patients:  140 - 180 mg/dL   Results for RAEES, ATALLAH (MRN [id number]) as of 02/22/2019 12:31  Ref. Range 02/21/2019 07:59 02/21/2019 11:26 02/21/2019 16:28 02/21/2019 20:27  Glucose-Capillary Latest Ref Range: 70 - 99 mg/dL 205 (H)  7 units NOVOLOG  196 (H)  4 units NOVOLOG  177 (H)  4 units NOVOLOG  207 (H)  2 units NOVOLOG    Results for BENZ, CHANCE (MRN [id number]) as of 02/22/2019 12:31  Ref. Range 02/22/2019 08:15 02/22/2019 11:39  Glucose-Capillary Latest Ref Range: 70 - 99 mg/dL 185 (H)  4 units NOVOLOG  241 (H)  7 units NOVOLOG     Admit with: Coronavirus pneumonitis  History: DM, CKD, Dementia  Home DM Meds: Glipizide 10 mg BID       Metformin 500 mg BID  Current Orders: Lantus 12 units Daily      Novolog Resistant Correction Scale/ SSI (0-20 units) TID AC + HS      Tradjenta 5 mg Daily    Getting Decadron 6 mg BID.  Lantus 8 units given at 5:45pm on 01/02--NO Lantus given yesterday (01/03) evening.  Patient scheduled to get 12 units Lantus this evening at 6:30pm.  Also getting Ensure Enlive PO supplements BID between meals (each of which contain 45 grams of carbohydrates)    MD- May consider adding Novolog Meal Coverage:  Novolog 4 units TID with meals  (Please add the following Hold Parameters: Hold if pt eats <50% of meal, Hold if pt NPO)     --Will follow patient during hospitalization--  Wyn Quaker RN, MSN, CDE Diabetes Coordinator Inpatient Glycemic Control Team Team Pager: [phone] (8a-5p)

## 2019-02-22 NOTE — Progress Notes (Signed)
Nurse reports patient with signs of fluid overload and decreased oxygen saturations with high BP . Bronchodilator Lasix   oxygen support as needed keep sats above 90

## 2019-02-22 NOTE — Progress Notes (Signed)
PT Cancellation Note  Patient Details Name: Lance Nunez MRN: [id number] DOB: 12-Jun-1950   Cancelled Treatment:    Reason Eval/Treat Not Completed: Other (comment)  Pt yelling out in room while donning PPE. Upon entering pt kept yelling "Help" repeatedly.  Does not verbalize needs.  Pt clean and dry and was assisted to reposition in bed.  Imaging staff waiting to take patient to CT.  Monitors showing tachycardia at times.  Will hold session for today and continue as appropriate.     Chesley Noon 02/22/2019, 4:20 PM

## 2019-02-22 NOTE — Progress Notes (Addendum)
Pt has a congested cough, nurse unable to clearly hear lungs because pt constantly yells despite given lorazepam, seroquel and haldol.pt also will not swallow his dinner which is unusual for him  Left arm has NS at 100 continues, left arm more swollen this yesterday and this am.  IV team consult to for IV replacement. Fluids on hold and Dr Loleta Books notified regarding change in condition and order for chest xray. CT of head done previously today.

## 2019-02-22 NOTE — Progress Notes (Signed)
PROGRESS NOTE    Lance Nunez  [id number] DOB: Feb 17, 1951 DOA: 01/23/2019 PCP: Donnie Coffin, MD      Brief Narrative:  Mr. Lance Nunez is a 69 y.o. M with sCHF with AICD, CAD, CKD IIIa baseline 1.5, HTN, and dementia who presented with AICD discharge.  In the ER he was afebrile, HR and BP normal. Respirations 22 and 90% on room air.  CXR showed no disease.         Assessment & Plan:  Coronavirus pneumonitis Acute hypoxic respiratory failure Patient admitted with new O2 requirement 2L (Previous documentation erroneously stated the patient is on 2 L oxygen at baseline, he has not been for the last year).   Got Lasix on 1/1   Oxygen requirements have escalated, and the patient is severely confused, and agitated.   -Continue remdesivir -Continue dexamethasone, day 5 -Continue DVT prophylaxis with Lovenox -Continue zinc and vitamin C -Nutrition consult    Acute metabolic encephalopathy superimposed on dementia This appears considerably worse, each day.  It has worsened despite appropriate treatment of Covid with remdesivir and steroids and has not responded to this symptomatic treatment with treatment of pain, treatment of cough, treatment of dehydration with fluids, treatment of anxiety with lorazepam, treatment of delirium with Seroquel.  CT head was obtained today, report reviewed, unremarkable. -Continue home Seroquel -Continue melatonin -Continue donepezil -Continue PRN Seroquel 25 mg as needed for agitation  Respiratory distress Called by nursing this afternoon at the patient was suddenly more congested, appeared swollen, was in distress.  Stat chest x-ray was obtained, personally reviewed, shows actual improvement in his right lower lung opacity.  He appears to be failing to thrive, and I am concerned that his metabolic encephalopathy indicates that he will progress soon. -Consult palliative care   COPD  No wheezing -Continue Advair  Ischemic heart  disease -Continue aspirin, atorvastatin, carvedilol  Diabetes -Hold glipizide, Metformin -Continue sliding scale corrections   -Continue linagliptin -Continue new Lantus  AKI on CKD IIIb Creatinine 1.2 at baseline, has been trending up to 1.8 today.  His BUN is also climbing to 72.  He is not eating and drinking. -IV fluids  Hypernatremia Dehydration due to poor oral intake  Hyperkalemia Normalized  Microcytic anemia Iron studies suggest low iron sat. -Continue oral iron and docusate -FOBT as outpatient and GI consultation pending goals of care   Chest discomfort Patient presented with chief complaint of "my ICD fired".  Unfortunately interrogation of his ICD showed that it had not in fact fired.  Cardiology were consulted, who recommended no further work-up, continuing his home cardiovascular meds. -Appreciate cardiology input       DVT prophylaxis: Lovenox Code Status: Full code Family Communication: Daughter by phone  MDM and disposition Plan:  The below labs and imaging reports reviewed and summarized above.  Medication management as above.   The patient was admitted with confusion, found to have COVID-19 with acute hypoxic respiratory failure.  Prognosis worsening, I suspect that the patient may continue to decline and have an in-hospital death.      Barriers to discharge: Clinical improvement, weaning O2  Also PT eval, likely SNF      Objective: Vitals:   02/22/19 0818 02/22/19 1439 02/22/19 1439 02/22/19 2104  BP: 102/75 (!) 167/83  134/72  Pulse: 78 67  81  Resp: 19     Temp: 97.6 F (36.4 C)  98.9 F (37.2 C) (!) 100.4 F (38 C)  TempSrc: Oral  Oral Oral  SpO2: 94% 94%  (!) 78%  Weight:      Height:        Intake/Output Summary (Last 24 hours) at 02/22/2019 2141 Last data filed at 02/22/2019 2134 Gross per 24 hour  Intake --  Output 1300 ml  Net -1300 ml   Filed Weights   02/01/2019 0400 01/26/2019 1447 02/21/19 0453  Weight: 93.4 kg  95.6 kg 87.1 kg    Examination: General appearance: Overweight elderly adult male, alert and in appears agitated, and in mild distress.   HEENT: Anicteric, conjunctiva pale, lids and lashes normal. No nasal deformity, discharge, epistaxis.  Lips dry OP dry, no oral lesions.  Hearing diminished. Skin: Warm and dry.  No suspicious rashes or lesions. Cardiac: Tachycardic, regular, no murmurs appreciated.  No LE edema.    Respiratory: Tachypneic, lung sounds diminished, no rales or wheezes. Abdomen: Abdomen soft.  No tenderness palpation or guarding. No ascites, distension, hepatosplenomegaly.   MSK: No deformities or effusions of the large joints of the upper or lower extremities bilaterally. Neuro: Awake but agitated and restless, calling out, not oriented to self, place, or situation.  Does not consistently follow commands or make attempt to engage in interview or discussion.   Moves all extremities with generalized weakness, symmetric strength. Psych: Agitated, attention diminished, judgment insight appear severely impaired            Data Reviewed: I have personally reviewed following labs and imaging studies:  CBC: Recent Labs  Lab 02/06/2019 0410 02/19/19 0446 02/20/19 0604 02/21/19 0558 02/22/19 0515  WBC 4.0 7.9 11.3* 12.6* 10.2  HGB 9.4* 10.4* 11.0* 11.6* 11.3*  HCT 29.6* 34.4* 34.9* 39.7 36.5*  MCV 71.3* 72.6* 69.5* 74.1* 70.6*  PLT 141* 199 259 317 [id number]   Basic Metabolic Panel: Recent Labs  Lab 02/15/2019 0410 01/19/2019 0411 02/19/19 0446 02/20/19 0604 02/21/19 0558 02/22/19 0515  NA  --  136 142 142 143 147*  K  --  4.3 5.3* 4.5 4.8 4.9  CL  --  101 103 102 104 107  CO2  --  28 31 29 28 28   GLUCOSE  --  196* 193* 259* 219* 218*  BUN  --  29* 38* 46* 55* 72*  CREATININE  --  1.60* 1.39* 1.43* 1.51* 1.80*  CALCIUM  --  7.9* 8.4* 8.6* 8.5* 8.7*  MG 2.2  --   --   --   --   --    GFR: Estimated Creatinine Clearance: 40.6 mL/min (A) (by C-G formula based on  SCr of 1.8 mg/dL (H)). Liver Function Tests: Recent Labs  Lab 02/19/19 0446 02/20/19 0604 02/21/19 0558 02/22/19 0515  AST 35 29 22 19   ALT 23 22 20 20   ALKPHOS 54 53 55 51  BILITOT 0.7 1.0 0.8 0.9  PROT 6.8 6.6 6.7 6.5  ALBUMIN 3.1* 2.9* 3.1* 3.3*   No results for input(s): LIPASE, AMYLASE in the last 168 hours. No results for input(s): AMMONIA in the last 168 hours. Coagulation Profile: No results for input(s): INR, PROTIME in the last 168 hours. Cardiac Enzymes: No results for input(s): CKTOTAL, CKMB, CKMBINDEX, TROPONINI in the last 168 hours. BNP (last 3 results) No results for input(s): PROBNP in the last 8760 hours. HbA1C: No results for input(s): HGBA1C in the last 72 hours. CBG: Recent Labs  Lab 02/21/19 2027 02/22/19 0815 02/22/19 1139 02/22/19 1628 02/22/19 2107  GLUCAP 207* 185* 241* 116* 169*   Lipid Profile: No results for input(s): CHOL, HDL, LDLCALC,  TRIG, CHOLHDL, LDLDIRECT in the last 72 hours. Thyroid Function Tests: No results for input(s): TSH, T4TOTAL, FREET4, T3FREE, THYROIDAB in the last 72 hours. Anemia Panel: No results for input(s): VITAMINB12, FOLATE, FERRITIN, TIBC, IRON, RETICCTPCT in the last 72 hours. Urine analysis:    Component Value Date/Time   COLORURINE YELLOW (A) 01/17/2018 1558   APPEARANCEUR CLEAR (A) 01/17/2018 1558   LABSPEC 1.018 01/17/2018 1558   PHURINE 5.0 01/17/2018 1558   GLUCOSEU NEGATIVE 01/17/2018 1558   HGBUR SMALL (A) 01/17/2018 1558   BILIRUBINUR NEGATIVE 01/17/2018 1558   KETONESUR NEGATIVE 01/17/2018 1558   PROTEINUR 100 (A) 01/17/2018 1558   NITRITE NEGATIVE 01/17/2018 1558   LEUKOCYTESUR NEGATIVE 01/17/2018 1558   Sepsis Labs: @LABRCNTIP (procalcitonin:4,lacticacidven:4)  ) Recent Results (from the past 240 hour(s))  Respiratory Panel by RT PCR (Flu A&B, Covid) - Nasopharyngeal Swab     Status: Abnormal   Collection Time: 02/18/2019  3:37 AM   Specimen: Nasopharyngeal Swab  Result Value Ref Range  Status   SARS Coronavirus 2 by RT PCR POSITIVE (A) NEGATIVE Final    Comment: RESULT CALLED TO, READ BACK BY AND VERIFIED WITH: Dorian Furnace RN [phone] 02/02/2019 HNM (NOTE) SARS-CoV-2 target nucleic acids are DETECTED. SARS-CoV-2 RNA is generally detectable in upper respiratory specimens  during the acute phase of infection. Positive results are indicative of the presence of the identified virus, but do not rule out bacterial infection or co-infection with other pathogens not detected by the test. Clinical correlation with patient history and other diagnostic information is necessary to determine patient infection status. The expected result is Negative. Fact Sheet for Patients:  PinkCheek.be Fact Sheet for Healthcare Providers: GravelBags.it This test is not yet approved or cleared by the [place] FDA and  has been authorized for detection and/or diagnosis of SARS-CoV-2 by FDA under an Emergency Use Authorization (EUA).  This EUA will remain in effect (meaning this test can be used)  for the duration of  the COVID-19 declaration under Section 564(b)(1) of the Act, 21 U.S.C. section 360bbb-3(b)(1), unless the authorization is terminated or revoked sooner.    Influenza A by PCR NEGATIVE NEGATIVE Final   Influenza B by PCR NEGATIVE NEGATIVE Final    Comment: (NOTE) The Xpert Xpress SARS-CoV-2/FLU/RSV assay is intended as an aid in  the diagnosis of influenza from Nasopharyngeal swab specimens and  should not be used as a sole basis for treatment. Nasal washings and  aspirates are unacceptable for Xpert Xpress SARS-CoV-2/FLU/RSV  testing. Fact Sheet for Patients: PinkCheek.be Fact Sheet for Healthcare Providers: GravelBags.it This test is not yet approved or cleared by the [place] FDA and  has been authorized for detection and/or diagnosis of SARS-CoV-2 by    FDA under an Emergency Use Authorization (EUA). This EUA will remain  in effect (meaning this test can be used) for the duration of the  Covid-19 declaration under Section 564(b)(1) of the Act, 21  U.S.C. section 360bbb-3(b)(1), unless the authorization is  terminated or revoked. Performed at [facility], [address] [place],  [address]          Radiology Studies: CT HEAD WO CONTRAST  Result Date: 02/22/2019 CLINICAL DATA:  Altered mental status/encephalopathy. COVID-19 positive. EXAM: CT HEAD WITHOUT CONTRAST TECHNIQUE: Contiguous axial images were obtained from the base of the skull through the vertex without intravenous contrast. COMPARISON:  04/30/2017 FINDINGS: Brain: There is no evidence of acute infarct, intracranial hemorrhage, mass, midline shift, or extra-axial fluid collection. Central predominant cerebral  atrophy is similar to the prior study and most prominent in the frontal lobes. Vascular: Calcified atherosclerosis at the skull base. No hyperdense vessel. Skull: No fracture or suspicious osseous lesion. Sinuses/Orbits: Partially visualized mucosal thickening or small retention cyst in the right maxillary sinus. Clear mastoid air cells. Unremarkable orbits. Other: None. IMPRESSION: 1. No evidence of acute intracranial abnormality. 2. Unchanged cerebral atrophy. Electronically Signed   By: Logan Bores M.D.   On: 02/22/2019 16:11   DG Chest Port 1 View  Result Date: 02/22/2019 CLINICAL DATA:  Cough. EXAM: PORTABLE CHEST 1 VIEW COMPARISON:  02/18/2019 FINDINGS: The heart size remains enlarged. A dual chamber pacemaker is again noted. There is an airspace opacity in the right mid lung zone which has improved from prior study. There is no pneumothorax. No large pleural effusion. Bibasilar atelectasis is noted. IMPRESSION: 1. Persistent but improving airspace opacity in the right mid to lower lung zone. 2. Stable cardiac silhouette. 3. Probable bibasilar  atelectasis. Electronically Signed   By: Constance Holster M.D.   On: 02/22/2019 17:51        Scheduled Meds: . vitamin C  500 mg Oral Daily  . aspirin EC  81 mg Oral Daily  . atorvastatin  40 mg Oral q1800  . carvedilol  12.5 mg Oral BID WC  . Chlorhexidine Gluconate Cloth  6 each Topical Daily  . docusate sodium  100 mg Oral Daily  . donepezil  5 mg Oral QHS  . enoxaparin (LOVENOX) injection  40 mg Subcutaneous Q24H  . feeding supplement (ENSURE ENLIVE)  237 mL Oral BID BM  . ferrous sulfate  325 mg Oral BID WC  . insulin aspart  0-20 Units Subcutaneous TID WC  . insulin aspart  0-5 Units Subcutaneous QHS  . insulin glargine  12 Units Subcutaneous Daily  . linagliptin  5 mg Oral Daily  . Melatonin  2.5 mg Oral QHS  . mometasone-formoterol  2 puff Inhalation BID  . QUEtiapine  100 mg Oral QHS  . sertraline  50 mg Oral Daily  . zinc sulfate  220 mg Oral Daily   Continuous Infusions: . sodium chloride 100 mL/hr at 02/22/19 [id number]     LOS: 4 days    Time spent: 35 minutes    Edwin Dada, MD [facility] 02/22/2019, 9:41 PM     Please page though [place] or Epic secure chat:  For password, contact charge nurse

## 2019-02-23 LAB — CBC
HCT: 42.9 % (ref 39.0–52.0)
Hemoglobin: 12.2 g/dL — ABNORMAL LOW (ref 13.0–17.0)
MCH: 21.7 pg — ABNORMAL LOW (ref 26.0–34.0)
MCHC: 28.4 g/dL — ABNORMAL LOW (ref 30.0–36.0)
MCV: 76.3 fL — ABNORMAL LOW (ref 80.0–100.0)
Platelets: 366 10*3/uL (ref 150–400)
RBC: 5.62 MIL/uL (ref 4.22–5.81)
RDW: 22.6 % — ABNORMAL HIGH (ref 11.5–15.5)
WBC: 14.4 10*3/uL — ABNORMAL HIGH (ref 4.0–10.5)
nRBC: 0.1 % (ref 0.0–0.2)

## 2019-02-23 LAB — COMPREHENSIVE METABOLIC PANEL
ALT: 22 U/L (ref 0–44)
AST: 26 U/L (ref 15–41)
Albumin: 3.3 g/dL — ABNORMAL LOW (ref 3.5–5.0)
Alkaline Phosphatase: 53 U/L (ref 38–126)
Anion gap: 15 (ref 5–15)
BUN: 75 mg/dL — ABNORMAL HIGH (ref 8–23)
CO2: 29 mmol/L (ref 22–32)
Calcium: 8.4 mg/dL — ABNORMAL LOW (ref 8.9–10.3)
Chloride: 109 mmol/L (ref 98–111)
Creatinine, Ser: 2.46 mg/dL — ABNORMAL HIGH (ref 0.61–1.24)
GFR calc Af Amer: 30 mL/min — ABNORMAL LOW (ref >60)
GFR calc non Af Amer: 26 mL/min — ABNORMAL LOW (ref >60)
Glucose, Bld: 204 mg/dL — ABNORMAL HIGH (ref 70–99)
Potassium: 4.7 mmol/L (ref 3.5–5.1)
Sodium: 153 mmol/L — ABNORMAL HIGH (ref 135–145)
Total Bilirubin: 1 mg/dL (ref 0.3–1.2)
Total Protein: 6.8 g/dL (ref 6.5–8.1)

## 2019-02-23 LAB — BLOOD GAS, ARTERIAL
Acid-Base Excess: 5.4 mmol/L — ABNORMAL HIGH (ref 0.0–2.0)
Bicarbonate: 35.5 mmol/L — ABNORMAL HIGH (ref 20.0–28.0)
FIO2: 0.21
O2 Saturation: 96.2 %
Patient temperature: 37
pCO2 arterial: 81 mmHg (ref 32.0–48.0)
pH, Arterial: 7.25 — ABNORMAL LOW (ref 7.350–7.450)
pO2, Arterial: 96 mmHg (ref 83.0–108.0)

## 2019-02-23 LAB — GLUCOSE, CAPILLARY
Glucose-Capillary: 192 mg/dL — ABNORMAL HIGH (ref 70–99)
Glucose-Capillary: 183 mg/dL — ABNORMAL HIGH (ref 70–99)

## 2019-02-23 LAB — URINALYSIS, COMPLETE (UACMP) WITH MICROSCOPIC
Bilirubin Urine: NEGATIVE
Glucose, UA: NEGATIVE mg/dL
Ketones, ur: NEGATIVE mg/dL
Nitrite: NEGATIVE
Protein, ur: 30 mg/dL — AB
RBC / HPF: >50 RBC/hpf — ABNORMAL HIGH (ref 0–5)
Specific Gravity, Urine: 1.012 (ref 1.005–1.030)
Squamous Epithelial / HPF: NONE SEEN (ref 0–5)
pH: 5 (ref 5.0–8.0)

## 2019-02-23 LAB — C-REACTIVE PROTEIN: CRP: 5.5 mg/dL — ABNORMAL HIGH (ref <1.0)

## 2019-02-23 LAB — D-DIMER, QUANTITATIVE: D-Dimer, Quant: 4.48 ug/mL-FEU — ABNORMAL HIGH (ref 0.00–0.50)

## 2019-02-23 MED ORDER — SODIUM CHLORIDE 0.9 % IV SOLN
2.0000 g | Freq: Two times a day (BID) | INTRAVENOUS | Status: DC
  Administered 2019-02-23: 2 g via INTRAVENOUS
  Filled 2019-02-23 (×2): qty 2

## 2019-02-23 MED ORDER — ENOXAPARIN SODIUM 30 MG/0.3ML ~~LOC~~ SOLN: 30.0000 mg | SUBCUTANEOUS | Status: DC

## 2019-02-23 MED ORDER — VANCOMYCIN HCL 1750 MG/350ML IV SOLN
1750.0000 mg | Freq: Once | INTRAVENOUS | Status: AC
  Administered 2019-02-23: 1750 mg via INTRAVENOUS
  Filled 2019-02-23: qty 350

## 2019-02-23 MED ORDER — MORPHINE SULFATE (PF) 2 MG/ML IV SOLN
1.0000 mg | INTRAVENOUS | Status: DC | PRN
  Administered 2019-02-23: 2 mg via INTRAVENOUS
  Filled 2019-02-23: qty 1

## 2019-02-23 MED ORDER — SODIUM CHLORIDE 0.9 % IV SOLN: 2.0000 g | INTRAVENOUS | Status: DC

## 2019-02-23 MED ORDER — VANCOMYCIN VARIABLE DOSE PER UNSTABLE RENAL FUNCTION (PHARMACIST DOSING): Status: DC

## 2019-02-24 LAB — BLOOD CULTURE ID PANEL (REFLEXED)

## 2019-02-24 LAB — URINE CULTURE: Culture: NO GROWTH

## 2019-02-26 LAB — CULTURE, BLOOD (ROUTINE X 2)

## 2019-03-22 NOTE — Progress Notes (Signed)
Pharmacy Antibiotic Note  Lance Nunez is a 69 y.o. male admitted on 01/22/2019 with sepsis.  Pharmacy has been consulted for vancomycin and cefepime dosing.  Plan: Cefepime 2 g IV q24h  Vancomycin 1750 mg IV x1 loading dose SCr 1.8>2.46  Will order placeholder order for vancomycin and dose per levels PK suggests dosing interval of 36 h or 48 h. Will order vancomycin random for 1800 tomorrow Scr in AM to continue to monitor renal function  Height: 5\' 6"  (167.6 cm) Weight: 192 lb 0.3 oz (87.1 kg) IBW/kg (Calculated) : 63.8  Temp (24hrs), Avg:99.8 F (37.7 C), Min:98.7 F (37.1 C), Max:101 F (38.3 C)  Recent Labs  Lab 02/19/19 0446 02/20/19 0604 02/21/19 0558 02/22/19 0515 2019/03/08 0639  WBC 7.9 11.3* 12.6* 10.2 14.4*  CREATININE 1.39* 1.43* 1.51* 1.80* 2.46*    Estimated Creatinine Clearance: 29.7 mL/min (A) (by C-G formula based on SCr of 2.46 mg/dL (H)).    Allergies  Allergen Reactions  . Prednisone Other (See Comments)    Pt states, "I just cant take it, I dont know"  . Axid [Nizatidine]   . Codeine Itching    Antimicrobials this admission: Cefepime/Vancomycin 1/5 >>   Dose adjustments this admission:   Microbiology results: 1/5 BCx: ordered 1/5 UCx: ordered    Thank you for allowing pharmacy to be a part of this patient's care.  Rocky Morel 03-08-2019 11:53 AM

## 2019-03-22 NOTE — Progress Notes (Signed)
I have attempted to call report twice for patient to be transferred to ccu bed 20, patient on continuous BiPAP. However nurse is currently unavailable, is to call me as soon as she becomes available.

## 2019-03-22 NOTE — Progress Notes (Addendum)
CCMD called- reported patient's HR is asystole.  Assessed patient-  Breath sound and Heart beat no longer heard,    MD made aware- will come and confirm.   Nurse to notify daughter

## 2019-03-22 NOTE — Death Summary Note (Signed)
Expiration Note/ Death Summary  Lance Nunez  MR#: [id number]  DOB:10-12-1950  Date of Admission: 02-27-19 Date of Death: 2019/03/05  Attending [place]  Patient's PCP: Donnie Coffin, MD     Cause of Death: COVID-19   Secondary Diagnoses Present on Admission: Chronic systolic CHF Coronary artery disease CKD stage IIIa Hypertension Dementia       Brief H and P: [name] is a 69 y.o. M with sCHF with AICD, CAD, CKD IIIa baseline 1.5, HTN, and dementia who presented initially with chest discomfort that was thought to be from AICD discharge.  In the ER he was afebrile, HR and BP normal.  ICD was interrogated and showed no discharge.   CXR showed no disease but respirations 22 and initially on 90% on room air but quickly required 2L supplemental O2 to keep SpO2 >88%.           Hospital Course: The patient was admitted and started on remdesivir and high dose steroids.  His inflammatory markers trended down but he remained on 2 to 4 L, and and developed a severe metabolic encephalopathy compounding his baseline dementia.    COVID-specific treatments were continued and the patient continued to decline, his oral intake diminished, and his renal function began to worsen.  Fluids were started and his renal function continued to worsen and he developed new rales.  CXR remained clear, he was started on empiric antibiotics and Lasix but became progressively more obtunded.  ABG showed a hypoxic and hypercarbic respiratory failure and so he was started on BiPAP without improvement.  Intubation was felt to have an exceedingly low likelihood of returning the patient to his prior level of function and family understandably wished to spare him unnecessary suffering at the end of life.  BiPAP was removed, he was made comfortable, and he passed away resting comfortably.           Signed:  Edwin Dada M.D. [facility] Mar 05, 2019,  9:01 PM

## 2019-03-22 NOTE — Progress Notes (Signed)
Called report to ccu nurse Katharine Look. However per dr. Loleta Books hold patient on 2A until he speaks with daughter, discussing to make patient a comfort care. Will continue to monitor closely

## 2019-03-22 NOTE — Progress Notes (Signed)
[employer] called to make aware patient has past, ICD needs to be turned off. Made aware by rep, she can be here in two hours to turn off ICD

## 2019-03-22 NOTE — Progress Notes (Signed)
OT Cancellation Note  Patient Details Name: Lance Nunez MRN: [id number] DOB: [birth year]   Cancelled Treatment:    Reason Eval/Treat Not Completed: Medical issues which prohibited therapy;Fatigue/lethargy limiting ability to participate. Chart reviewed pt noted to be unresponsive. Will hold session this date and re-attempt as available and pt medially appropriate.   Shara Blazing, M.S., OTR/L Ascom: [phone] 03-06-2019, 1:10 PM

## 2019-03-22 NOTE — Progress Notes (Addendum)
Patient lungs sounded congested after completion of bolus fluids. 02 saturation in mid 80s at 3L 02.  Continuous fluids stopped. MD notified. Acknowledged. New orders placed. 02 increased to 6L. Saturation level in low 90s. Patient screamed until 5 AM. Attemped prn medication with no improvement. Saturation levels increased to mid 90s once resting. Patient had spiked fevers twice during shift. Medication given/. mprovement

## 2019-03-22 NOTE — Progress Notes (Signed)
Anticoagulation monitoring(Lovenox):  68yo  male ordered Lovenox 40 mg Q24h  Filed Weights   01/24/2019 0400 01/23/2019 1447 02/21/19 0453  Weight: 205 lb 14.6 oz (93.4 kg) 210 lb 11.2 oz (95.6 kg) 192 lb 0.3 oz (87.1 kg)   Body mass index is 30.99 kg/m.   Lab Results  Component Value Date   CREATININE 2.46 (H) 03-14-19   CREATININE 1.80 (H) 02/22/2019   CREATININE 1.51 (H) 02/21/2019   Estimated Creatinine Clearance: 29.7 mL/min (A) (by C-G formula based on SCr of 2.46 mg/dL (H)). Hemoglobin & Hematocrit     Component Value Date/Time   HGB 12.2 (L) 03-14-19 0639   HGB 13.6 12/12/2012 1913   HCT 42.9 Mar 14, 2019 0639   HCT 39.5 (L) 12/12/2012 1913     Per Protocol for Patient with estCrcl < 30 ml/min and BMI < 40, will transition to Lovenox 30 mg Q24h.

## 2019-03-22 NOTE — Progress Notes (Addendum)
PT Cancellation Note  Patient Details Name: Lance Nunez MRN: [id number] DOB: 24-Apr-1950   Cancelled Treatment:    Reason Eval/Treat Not Completed: Medical issues which prohibited therapy   Chart reviewed, unresponsive per nursing notes.  Hold session for today and will continue as appropriate tomorrow.     Chesley Noon Feb 26, 2019, 12:30 PM

## 2019-03-22 NOTE — Progress Notes (Signed)
[place] [facility] called. Patient was released reference number is [id number] per rep Marybelle Killings

## 2019-03-22 NOTE — Progress Notes (Signed)
Patient is not responsive- unable to give oral medications.  MD is aware.  ABG order and bipap recommend.

## 2019-03-22 NOTE — Progress Notes (Signed)
MD is aware.

## 2019-03-22 NOTE — Progress Notes (Signed)
Made Dr. Loleta Books aware patient is lethargic on 6l sat 92-93%. History of copd. Per md order ABG. Will continue to monitor closely

## 2019-03-22 NOTE — Progress Notes (Signed)
Made Dr. Loleta Books patient is on the HFNC sat in the 70% per md place on a bipap and discontinue continues pulse ox. md also to order prn morphine for SOB. Also made md aware family is in medically mall however family has tested positive for COVID, per [facility] family can not come up to see patient.

## 2019-03-22 DEATH — deceased

## 2019-07-27 IMAGING — CR DG CHEST 2V
2 series · 2 of 2 positions shown · non-contrast
Comparison: 11/01/2011

CLINICAL DATA: Dyspnea

EXAM:
CHEST - 2 VIEW

[chest pa]
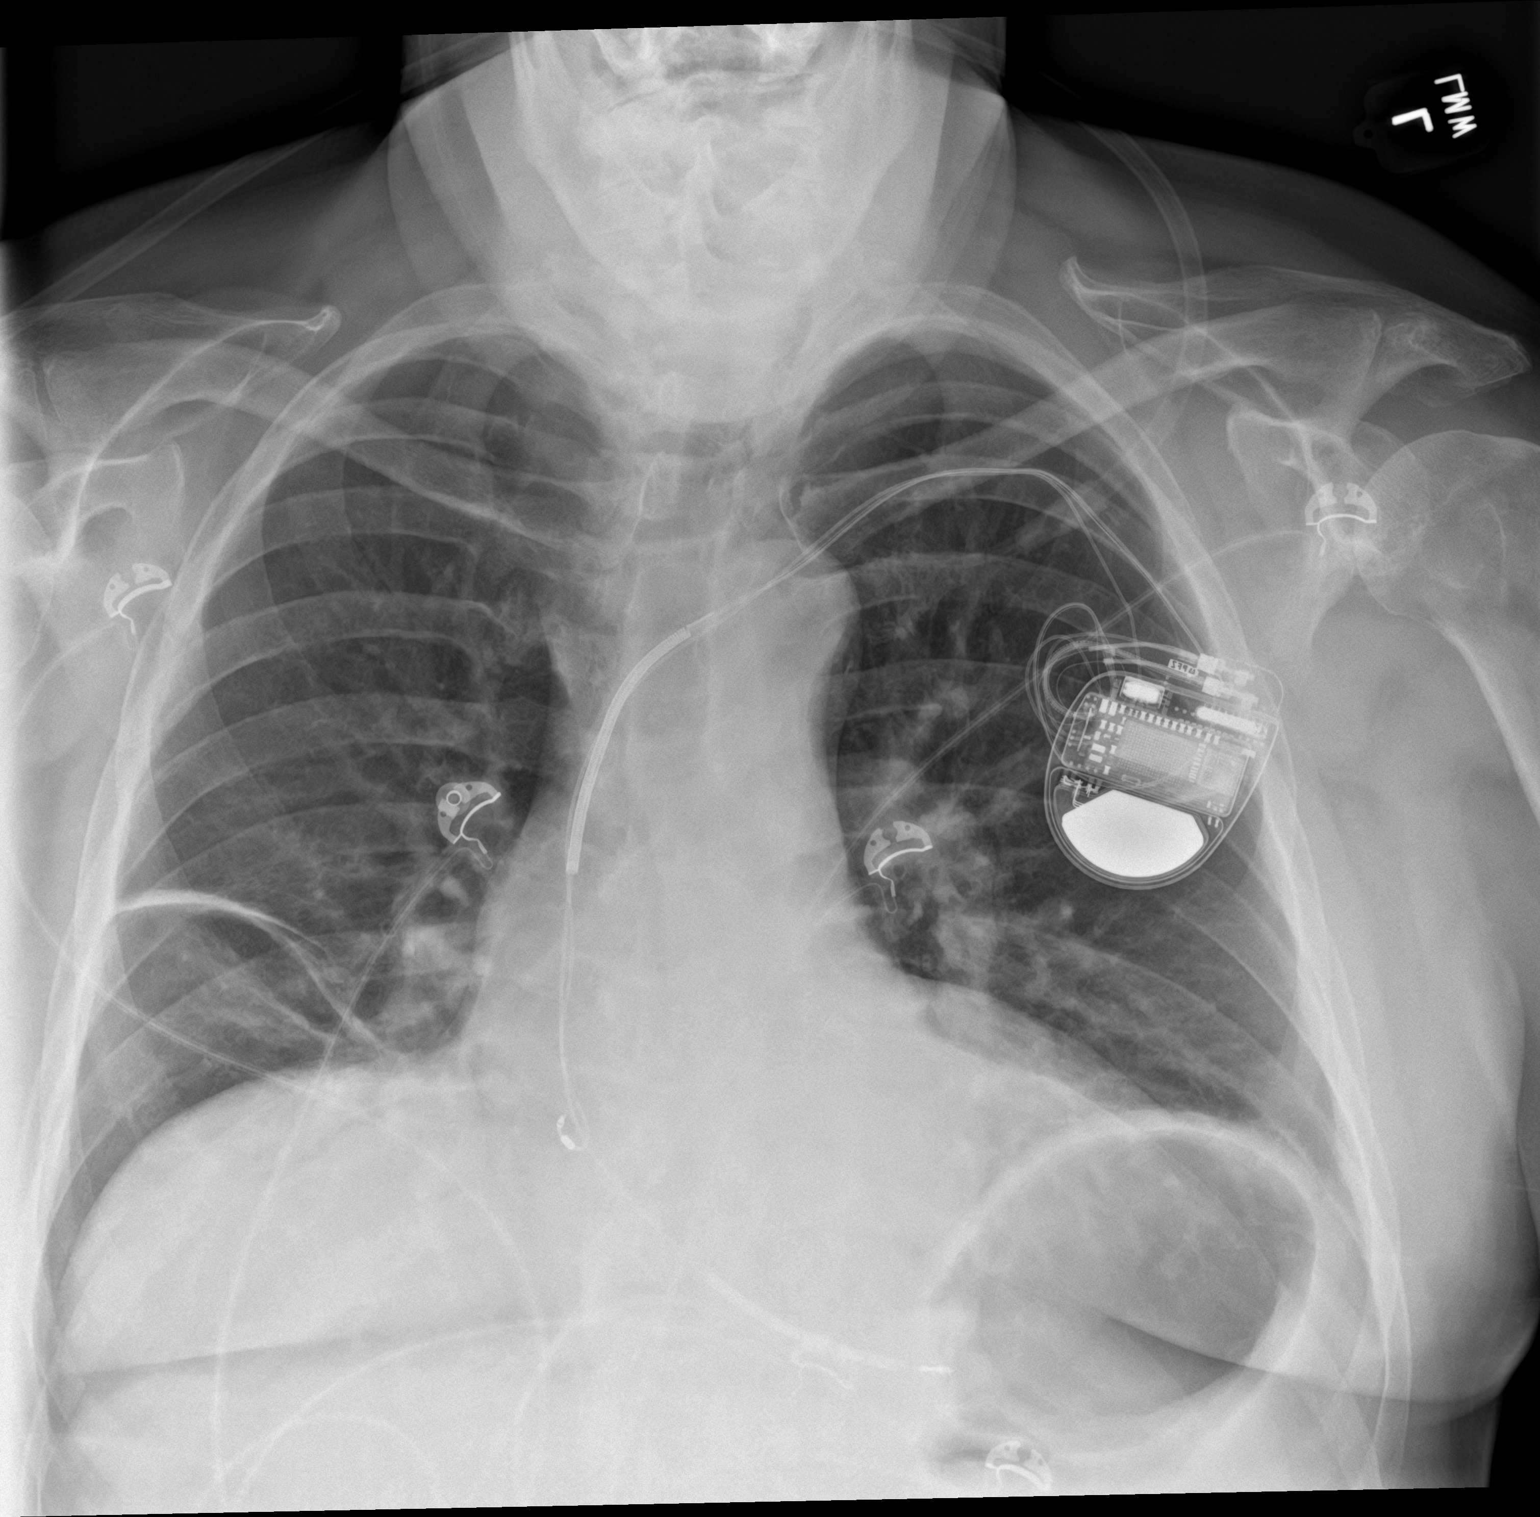

[chest lat]
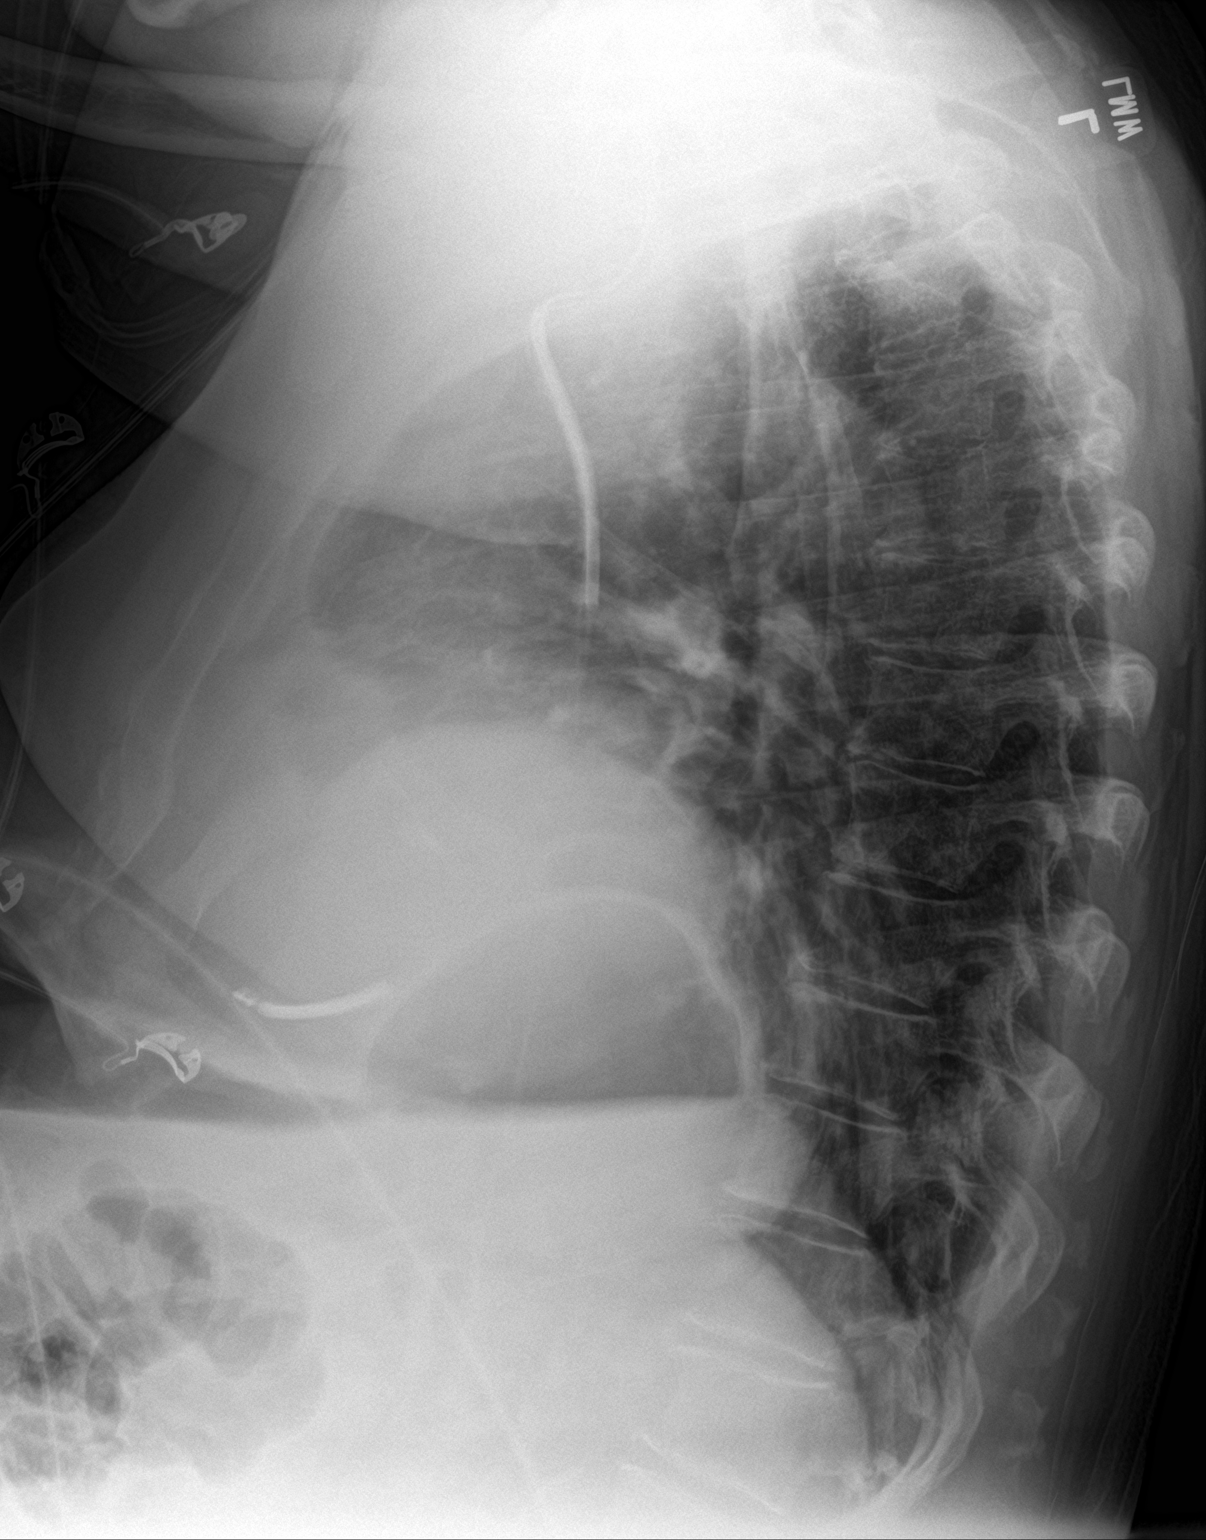

[2 of 2 positions shown; findings below may reference images not displayed]

FINDINGS: Stable mild cardiomegaly. Minimal aortic atherosclerosis.
Atelectasis or scarring at the right lung base. No overt pulmonary
edema. No effusion or pneumothorax. No pulmonary consolidations. ICD
device projects over the left hemithorax with leads in the right
atrium and right ventricle. Osteoarthritis of the included
glenohumeral joints.
IMPRESSION: No active pulmonary disease. Mild cardiomegaly with aortic
atherosclerosis.

## 2019-08-12 IMAGING — DX DG CHEST 1V PORT
1 series · 1 of 1 positions shown · non-contrast
Comparison: 01/01/2018

CLINICAL DATA: Evaluate for pneumonia.

EXAM:
PORTABLE CHEST 1 VIEW

[chest ap]
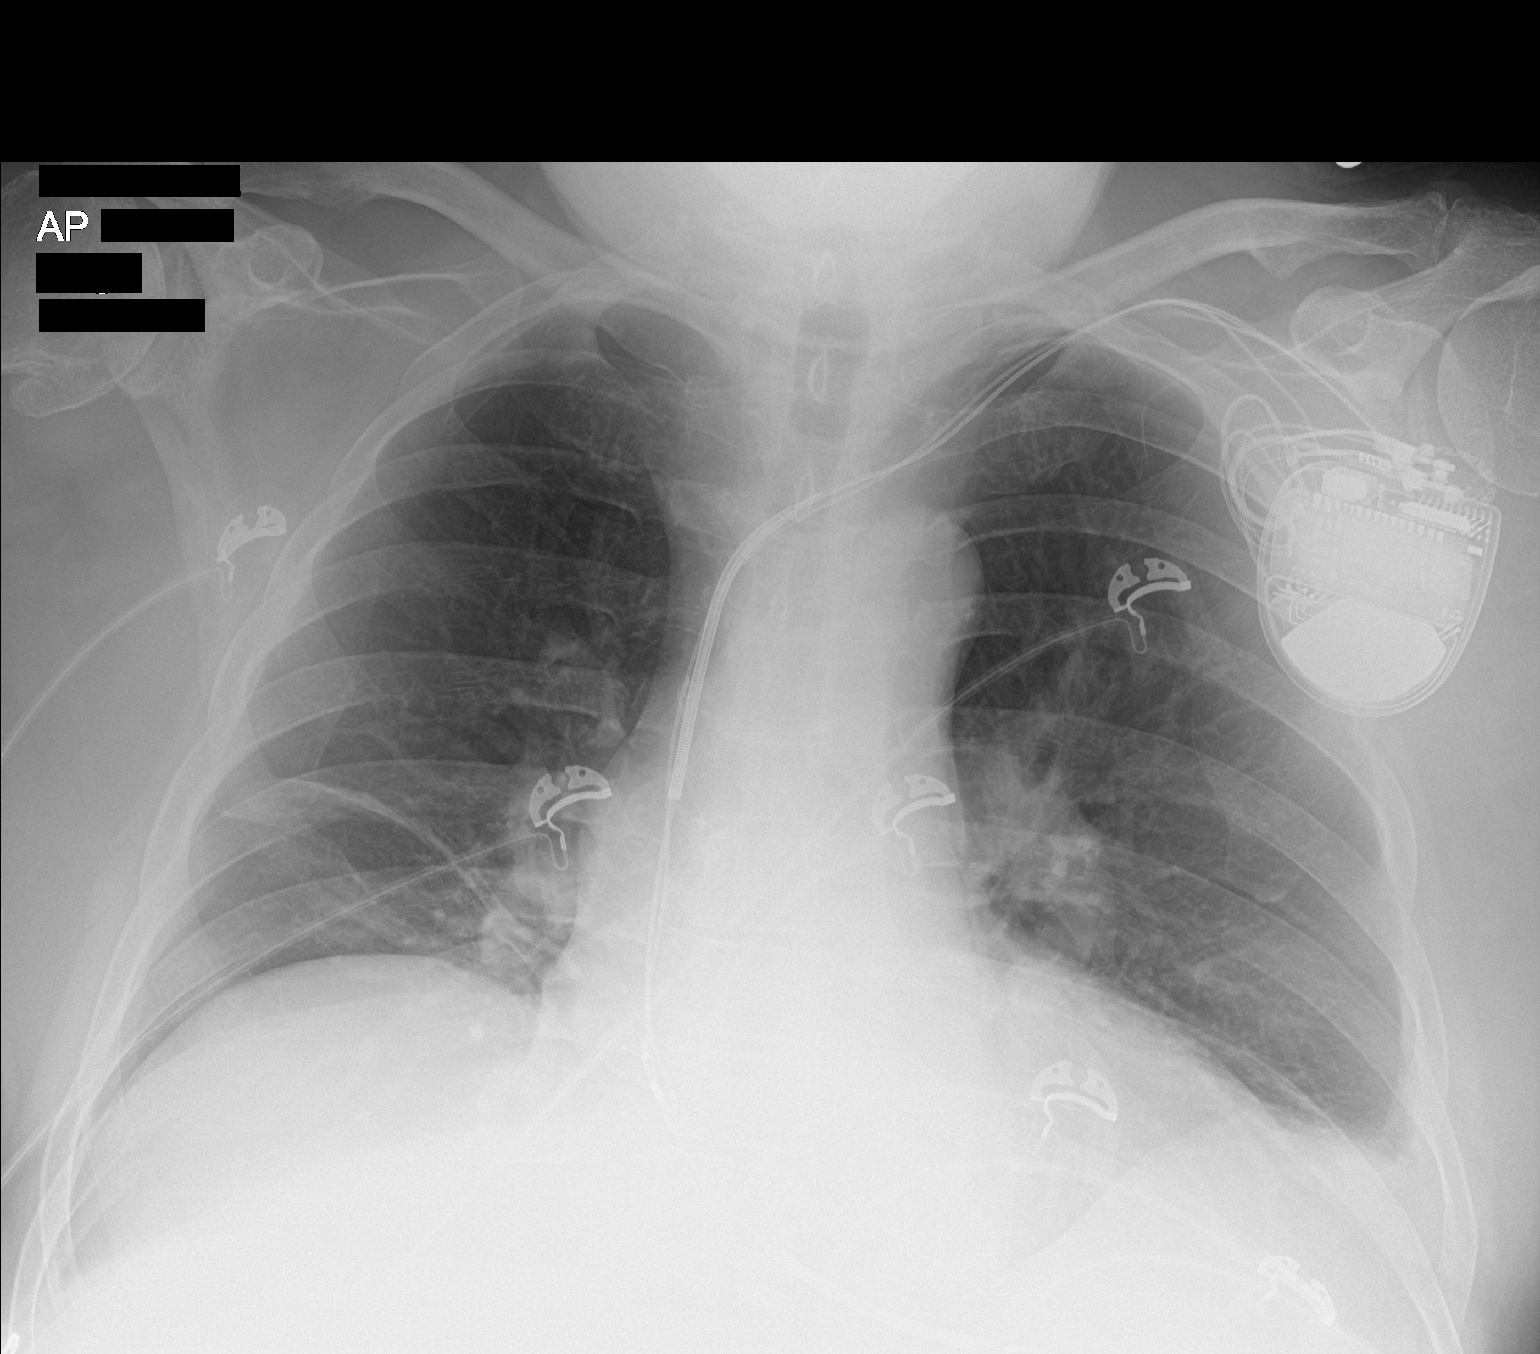

[1 of 1 positions shown; findings below may reference images not displayed]

FINDINGS: Left chest wall AICD device noted with leads in the right atrial
appendage and right ventricle. There is a scar like density within
the right lower lobe. No pleural effusion or edema. No airspace
opacities.
IMPRESSION: 1. No acute cardiopulmonary abnormalities.
